# Patient Record
Sex: Female | Born: 1990 | Race: White | Hispanic: No | Marital: Married | Smoking: Former smoker
Health system: Southern US, Community
[De-identification: ages and names within clinical notes are randomized; demographics above are authoritative.]

## PROBLEM LIST (undated history)

## (undated) ENCOUNTER — Emergency Department (HOSPITAL_COMMUNITY): Disposition: A | Discharge status: Home / Self Care | Payer: Medicaid Other

## (undated) ENCOUNTER — Inpatient Hospital Stay (HOSPITAL_COMMUNITY): Payer: Self-pay

## (undated) DIAGNOSIS — F419 Anxiety disorder, unspecified: Secondary | ICD-10-CM

## (undated) DIAGNOSIS — Z9889 Other specified postprocedural states: Secondary | ICD-10-CM

## (undated) DIAGNOSIS — G43019 Migraine without aura, intractable, without status migrainosus: Secondary | ICD-10-CM

## (undated) DIAGNOSIS — Z975 Presence of (intrauterine) contraceptive device: Secondary | ICD-10-CM

## (undated) DIAGNOSIS — F329 Major depressive disorder, single episode, unspecified: Secondary | ICD-10-CM

## (undated) DIAGNOSIS — G40909 Epilepsy, unspecified, not intractable, without status epilepticus: Secondary | ICD-10-CM

## (undated) DIAGNOSIS — F32A Depression, unspecified: Secondary | ICD-10-CM

## (undated) DIAGNOSIS — K219 Gastro-esophageal reflux disease without esophagitis: Secondary | ICD-10-CM

## (undated) DIAGNOSIS — R102 Pelvic and perineal pain: Secondary | ICD-10-CM

## (undated) DIAGNOSIS — R112 Nausea with vomiting, unspecified: Secondary | ICD-10-CM

## (undated) DIAGNOSIS — Z309 Encounter for contraceptive management, unspecified: Secondary | ICD-10-CM

## (undated) DIAGNOSIS — M5430 Sciatica, unspecified side: Secondary | ICD-10-CM

## (undated) DIAGNOSIS — R51 Headache: Secondary | ICD-10-CM

## (undated) DIAGNOSIS — M549 Dorsalgia, unspecified: Secondary | ICD-10-CM

## (undated) DIAGNOSIS — R569 Unspecified convulsions: Secondary | ICD-10-CM

## (undated) DIAGNOSIS — F319 Bipolar disorder, unspecified: Secondary | ICD-10-CM

## (undated) DIAGNOSIS — G4700 Insomnia, unspecified: Secondary | ICD-10-CM

## (undated) HISTORY — DX: Epilepsy, unspecified, not intractable, without status epilepticus: G40.909

## (undated) HISTORY — DX: Bipolar disorder, unspecified: F31.9

## (undated) HISTORY — DX: Anxiety disorder, unspecified: F41.9

## (undated) HISTORY — DX: Migraine without aura, intractable, without status migrainosus: G43.019

## (undated) HISTORY — DX: Depression, unspecified: F32.A

## (undated) HISTORY — DX: Major depressive disorder, single episode, unspecified: F32.9

## (undated) HISTORY — DX: Dorsalgia, unspecified: M54.9

## (undated) HISTORY — DX: Sciatica, unspecified side: M54.30

## (undated) HISTORY — DX: Unspecified convulsions: R56.9

## (undated) HISTORY — DX: Presence of (intrauterine) contraceptive device: Z97.5

## (undated) HISTORY — DX: Pelvic and perineal pain: R10.2

## (undated) HISTORY — DX: Encounter for contraceptive management, unspecified: Z30.9

## (undated) HISTORY — DX: Insomnia, unspecified: G47.00

## (undated) HISTORY — PX: TUBAL LIGATION: SHX77

---
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
## 2008-09-26 ENCOUNTER — Other Ambulatory Visit: Admission: RE | Admit: 2008-09-26 | Discharge: 2008-09-26 | Payer: Self-pay | Admitting: Obstetrics and Gynecology

## 2009-01-29 ENCOUNTER — Emergency Department (HOSPITAL_COMMUNITY): Admission: EM | Admit: 2009-01-29 | Discharge: 2009-01-29 | Payer: Self-pay | Admitting: Emergency Medicine

## 2009-08-11 ENCOUNTER — Inpatient Hospital Stay (HOSPITAL_COMMUNITY): Admission: RE | Admit: 2009-08-11 | Discharge: 2009-08-14 | Payer: Self-pay | Admitting: Obstetrics & Gynecology

## 2009-08-11 ENCOUNTER — Ambulatory Visit: Payer: Self-pay | Admitting: Family Medicine

## 2009-10-17 ENCOUNTER — Emergency Department (HOSPITAL_COMMUNITY): Admission: EM | Admit: 2009-10-17 | Discharge: 2009-10-17 | Payer: Self-pay | Admitting: Emergency Medicine

## 2009-10-30 ENCOUNTER — Emergency Department (HOSPITAL_COMMUNITY): Admission: EM | Admit: 2009-10-30 | Discharge: 2009-10-30 | Payer: Self-pay | Admitting: Emergency Medicine

## 2009-10-30 ENCOUNTER — Ambulatory Visit: Payer: Self-pay | Admitting: Psychiatry

## 2009-11-20 ENCOUNTER — Ambulatory Visit (HOSPITAL_COMMUNITY): Payer: Self-pay | Admitting: Psychiatry

## 2009-11-23 ENCOUNTER — Ambulatory Visit (HOSPITAL_COMMUNITY): Payer: Self-pay | Admitting: Psychiatry

## 2009-11-27 ENCOUNTER — Ambulatory Visit (HOSPITAL_COMMUNITY): Payer: Self-pay | Admitting: Psychiatry

## 2009-12-05 ENCOUNTER — Ambulatory Visit (HOSPITAL_COMMUNITY): Payer: Self-pay | Admitting: Psychiatry

## 2009-12-06 ENCOUNTER — Ambulatory Visit (HOSPITAL_COMMUNITY): Payer: Self-pay | Admitting: Psychiatry

## 2009-12-13 ENCOUNTER — Other Ambulatory Visit: Admission: RE | Admit: 2009-12-13 | Discharge: 2009-12-13 | Payer: Self-pay | Admitting: Obstetrics and Gynecology

## 2010-01-11 ENCOUNTER — Ambulatory Visit (HOSPITAL_COMMUNITY): Payer: Self-pay | Admitting: Psychiatry

## 2010-01-23 ENCOUNTER — Ambulatory Visit (HOSPITAL_COMMUNITY): Payer: Self-pay | Admitting: Psychiatry

## 2010-02-07 ENCOUNTER — Inpatient Hospital Stay (HOSPITAL_COMMUNITY)
Admission: RE | Admit: 2010-02-07 | Discharge: 2010-02-13 | Payer: Self-pay | Source: Home / Self Care | Attending: Psychiatry | Admitting: Psychiatry

## 2010-02-07 ENCOUNTER — Emergency Department (HOSPITAL_COMMUNITY)
Admission: EM | Admit: 2010-02-07 | Discharge: 2010-02-07 | Disposition: A | Discharge status: Other Acute Inpatient Hospital | Payer: Self-pay | Source: Home / Self Care | Admitting: Emergency Medicine

## 2010-02-08 ENCOUNTER — Inpatient Hospital Stay (HOSPITAL_COMMUNITY): Admission: AD | Admit: 2010-02-08 | Discharge: 2009-11-04 | Payer: Self-pay | Admitting: Psychiatry

## 2010-02-19 ENCOUNTER — Ambulatory Visit (HOSPITAL_COMMUNITY): Payer: Self-pay | Admitting: Psychiatry

## 2010-02-22 ENCOUNTER — Ambulatory Visit (HOSPITAL_COMMUNITY): Payer: Self-pay | Admitting: Psychiatry

## 2010-02-28 ENCOUNTER — Ambulatory Visit (HOSPITAL_COMMUNITY): Payer: Self-pay | Admitting: Psychiatry

## 2010-03-06 ENCOUNTER — Ambulatory Visit (HOSPITAL_COMMUNITY): Admit: 2010-03-06 | Payer: Self-pay | Admitting: Psychiatry

## 2010-03-07 ENCOUNTER — Ambulatory Visit (HOSPITAL_COMMUNITY)
Admission: RE | Admit: 2010-03-07 | Discharge: 2010-03-07 | Payer: Self-pay | Source: Home / Self Care | Attending: Psychiatry | Admitting: Psychiatry

## 2010-03-14 ENCOUNTER — Ambulatory Visit (HOSPITAL_COMMUNITY)
Admission: RE | Admit: 2010-03-14 | Discharge: 2010-03-14 | Payer: Self-pay | Source: Home / Self Care | Attending: Psychiatry | Admitting: Psychiatry

## 2010-03-20 ENCOUNTER — Ambulatory Visit (HOSPITAL_COMMUNITY)
Admission: RE | Admit: 2010-03-20 | Discharge: 2010-03-20 | Payer: Self-pay | Source: Home / Self Care | Attending: Psychiatry | Admitting: Psychiatry

## 2010-03-23 ENCOUNTER — Ambulatory Visit (HOSPITAL_COMMUNITY)
Admission: RE | Admit: 2010-03-23 | Discharge: 2010-03-23 | Payer: Self-pay | Source: Home / Self Care | Attending: Psychiatry | Admitting: Psychiatry

## 2010-04-06 ENCOUNTER — Encounter (INDEPENDENT_AMBULATORY_CARE_PROVIDER_SITE_OTHER): Payer: Self-pay | Admitting: Psychiatry

## 2010-04-06 ENCOUNTER — Ambulatory Visit (HOSPITAL_COMMUNITY): Admit: 2010-04-06 | Payer: Self-pay | Admitting: Psychiatry

## 2010-04-06 DIAGNOSIS — F332 Major depressive disorder, recurrent severe without psychotic features: Secondary | ICD-10-CM

## 2010-04-17 ENCOUNTER — Encounter (HOSPITAL_COMMUNITY): Payer: Self-pay | Admitting: Psychiatry

## 2010-04-17 ENCOUNTER — Encounter (INDEPENDENT_AMBULATORY_CARE_PROVIDER_SITE_OTHER): Payer: Commercial Managed Care - PPO | Admitting: Psychiatry

## 2010-04-17 DIAGNOSIS — F3189 Other bipolar disorder: Secondary | ICD-10-CM

## 2010-04-20 ENCOUNTER — Encounter (INDEPENDENT_AMBULATORY_CARE_PROVIDER_SITE_OTHER): Payer: Commercial Managed Care - PPO | Admitting: Psychiatry

## 2010-04-20 DIAGNOSIS — F332 Major depressive disorder, recurrent severe without psychotic features: Secondary | ICD-10-CM

## 2010-04-30 ENCOUNTER — Encounter (INDEPENDENT_AMBULATORY_CARE_PROVIDER_SITE_OTHER): Payer: Commercial Managed Care - PPO | Admitting: Psychiatry

## 2010-04-30 DIAGNOSIS — F332 Major depressive disorder, recurrent severe without psychotic features: Secondary | ICD-10-CM

## 2010-05-14 LAB — DIFFERENTIAL
Basophils Absolute: 0 10*3/uL (ref 0.0–0.1)
Basophils Relative: 0 % (ref 0–1)
Eosinophils Absolute: 0 10*3/uL (ref 0.0–0.7)
Eosinophils Relative: 0 % (ref 0–5)
Lymphocytes Relative: 34 % (ref 12–46)
Lymphs Abs: 1.5 10*3/uL (ref 0.7–4.0)
Monocytes Absolute: 0.3 10*3/uL (ref 0.1–1.0)
Monocytes Relative: 6 % (ref 3–12)
Neutro Abs: 2.7 10*3/uL (ref 1.7–7.7)
Neutrophils Relative %: 59 % (ref 43–77)

## 2010-05-14 LAB — PREGNANCY, URINE: Preg Test, Ur: NEGATIVE

## 2010-05-14 LAB — BASIC METABOLIC PANEL
BUN: 11 mg/dL (ref 6–23)
CO2: 25 mEq/L (ref 19–32)
Calcium: 9.9 mg/dL (ref 8.4–10.5)
Chloride: 107 mEq/L (ref 96–112)
Creatinine, Ser: 0.71 mg/dL (ref 0.4–1.2)
GFR calc Af Amer: >60 mL/min (ref >60)
GFR calc non Af Amer: >60 mL/min (ref >60)
Glucose, Bld: 95 mg/dL (ref 70–99)
Potassium: 3.8 mEq/L (ref 3.5–5.1)
Sodium: 141 mEq/L (ref 135–145)

## 2010-05-14 LAB — URINALYSIS, ROUTINE W REFLEX MICROSCOPIC
Bilirubin Urine: NEGATIVE
Glucose, UA: NEGATIVE mg/dL
Hgb urine dipstick: NEGATIVE
Ketones, ur: NEGATIVE mg/dL
Nitrite: NEGATIVE
Protein, ur: NEGATIVE mg/dL
Specific Gravity, Urine: 1.015 (ref 1.005–1.030)
Urobilinogen, UA: 0.2 mg/dL (ref 0.0–1.0)
pH: 7 (ref 5.0–8.0)

## 2010-05-14 LAB — RAPID URINE DRUG SCREEN, HOSP PERFORMED
Amphetamines: NOT DETECTED
Barbiturates: NOT DETECTED
Benzodiazepines: POSITIVE — AB
Cocaine: NOT DETECTED
Opiates: NOT DETECTED
Tetrahydrocannabinol: NOT DETECTED

## 2010-05-14 LAB — CBC
HCT: 38.8 % (ref 36.0–46.0)
Hemoglobin: 13.4 g/dL (ref 12.0–15.0)
MCH: 29.6 pg (ref 26.0–34.0)
MCHC: 34.5 g/dL (ref 30.0–36.0)
MCV: 85.8 fL (ref 78.0–100.0)
Platelets: 246 10*3/uL (ref 150–400)
RBC: 4.52 MIL/uL (ref 3.87–5.11)
RDW: 12.7 % (ref 11.5–15.5)
WBC: 4.5 10*3/uL (ref 4.0–10.5)

## 2010-05-14 LAB — VALPROIC ACID LEVEL: Valproic Acid Lvl: <10 ug/mL — ABNORMAL LOW (ref 50.0–100.0)

## 2010-05-14 LAB — ETHANOL: Alcohol, Ethyl (B): <5 mg/dL (ref 0–10)

## 2010-05-14 LAB — URINE MICROSCOPIC-ADD ON

## 2010-05-14 LAB — SALICYLATE LEVEL: Salicylate Lvl: <4 mg/dL (ref 2.8–20.0)

## 2010-05-14 LAB — ACETAMINOPHEN LEVEL: Acetaminophen (Tylenol), Serum: <10 ug/mL — ABNORMAL LOW (ref 10–30)

## 2010-05-15 ENCOUNTER — Encounter (INDEPENDENT_AMBULATORY_CARE_PROVIDER_SITE_OTHER): Payer: Commercial Managed Care - PPO | Admitting: Psychiatry

## 2010-05-15 DIAGNOSIS — F3189 Other bipolar disorder: Secondary | ICD-10-CM

## 2010-05-15 LAB — URINE CULTURE
Colony Count: 50000
Culture  Setup Time: 201112090242
Special Requests: NEGATIVE

## 2010-05-15 LAB — TSH: TSH: 0.094 u[IU]/mL — ABNORMAL LOW (ref 0.350–4.500)

## 2010-05-17 LAB — LIPID PANEL
Cholesterol: 109 mg/dL (ref 0–200)
HDL: 39 mg/dL — ABNORMAL LOW (ref >39)
LDL Cholesterol: 53 mg/dL (ref 0–99)
Total CHOL/HDL Ratio: 2.8 RATIO
Triglycerides: 87 mg/dL (ref <150)
VLDL: 17 mg/dL (ref 0–40)

## 2010-05-18 ENCOUNTER — Encounter (INDEPENDENT_AMBULATORY_CARE_PROVIDER_SITE_OTHER): Payer: Commercial Managed Care - PPO | Admitting: Psychiatry

## 2010-05-18 DIAGNOSIS — F332 Major depressive disorder, recurrent severe without psychotic features: Secondary | ICD-10-CM

## 2010-05-18 LAB — COMPREHENSIVE METABOLIC PANEL
ALT: 13 U/L (ref 0–35)
AST: 14 U/L (ref 0–37)
Albumin: 4.3 g/dL (ref 3.5–5.2)
Alkaline Phosphatase: 59 U/L (ref 39–117)
BUN: 14 mg/dL (ref 6–23)
CO2: 26 mEq/L (ref 19–32)
Calcium: 9.3 mg/dL (ref 8.4–10.5)
Chloride: 108 mEq/L (ref 96–112)
Creatinine, Ser: 0.66 mg/dL (ref 0.4–1.2)
GFR calc Af Amer: >60 mL/min (ref >60)
GFR calc non Af Amer: >60 mL/min (ref >60)
Glucose, Bld: 85 mg/dL (ref 70–99)
Potassium: 4.1 mEq/L (ref 3.5–5.1)
Sodium: 139 mEq/L (ref 135–145)
Total Bilirubin: 0.4 mg/dL (ref 0.3–1.2)
Total Protein: 7 g/dL (ref 6.0–8.3)

## 2010-05-18 LAB — CBC
HCT: 35.1 % — ABNORMAL LOW (ref 36.0–46.0)
HCT: 36.1 % (ref 36.0–46.0)
Hemoglobin: 11.8 g/dL — ABNORMAL LOW (ref 12.0–15.0)
Hemoglobin: 12 g/dL (ref 12.0–15.0)
MCH: 29.4 pg (ref 26.0–34.0)
MCH: 29.9 pg (ref 26.0–34.0)
MCHC: 33.3 g/dL (ref 30.0–36.0)
MCHC: 33.6 g/dL (ref 30.0–36.0)
MCV: 88.5 fL (ref 78.0–100.0)
MCV: 88.9 fL (ref 78.0–100.0)
Platelets: 226 10*3/uL (ref 150–400)
Platelets: 249 10*3/uL (ref 150–400)
RBC: 3.95 MIL/uL (ref 3.87–5.11)
RBC: 4.08 MIL/uL (ref 3.87–5.11)
RDW: 13.7 % (ref 11.5–15.5)
RDW: 13.8 % (ref 11.5–15.5)
WBC: 4.4 10*3/uL (ref 4.0–10.5)
WBC: 4.6 10*3/uL (ref 4.0–10.5)

## 2010-05-18 LAB — BASIC METABOLIC PANEL
BUN: 14 mg/dL (ref 6–23)
CO2: 27 mEq/L (ref 19–32)
Calcium: 9.3 mg/dL (ref 8.4–10.5)
Chloride: 105 mEq/L (ref 96–112)
Creatinine, Ser: 0.56 mg/dL (ref 0.4–1.2)
GFR calc Af Amer: >60 mL/min (ref >60)
GFR calc non Af Amer: >60 mL/min (ref >60)
Glucose, Bld: 89 mg/dL (ref 70–99)
Potassium: 3.8 mEq/L (ref 3.5–5.1)
Sodium: 137 mEq/L (ref 135–145)

## 2010-05-18 LAB — HEPATIC FUNCTION PANEL
ALT: 13 U/L (ref 0–35)
AST: 19 U/L (ref 0–37)
Albumin: 4.5 g/dL (ref 3.5–5.2)
Alkaline Phosphatase: 70 U/L (ref 39–117)
Bilirubin, Direct: 0.1 mg/dL (ref 0.0–0.3)
Indirect Bilirubin: 0.4 mg/dL (ref 0.3–0.9)
Total Bilirubin: 0.5 mg/dL (ref 0.3–1.2)
Total Protein: 7.8 g/dL (ref 6.0–8.3)

## 2010-05-18 LAB — DIFFERENTIAL
Basophils Absolute: 0 10*3/uL (ref 0.0–0.1)
Basophils Absolute: 0 10*3/uL (ref 0.0–0.1)
Basophils Relative: 0 % (ref 0–1)
Basophils Relative: 0 % (ref 0–1)
Eosinophils Absolute: 0 10*3/uL (ref 0.0–0.7)
Eosinophils Absolute: 0.1 10*3/uL (ref 0.0–0.7)
Eosinophils Relative: 1 % (ref 0–5)
Eosinophils Relative: 1 % (ref 0–5)
Lymphocytes Relative: 38 % (ref 12–46)
Lymphocytes Relative: 39 % (ref 12–46)
Lymphs Abs: 1.7 10*3/uL (ref 0.7–4.0)
Lymphs Abs: 1.8 10*3/uL (ref 0.7–4.0)
Monocytes Absolute: 0.2 10*3/uL (ref 0.1–1.0)
Monocytes Absolute: 0.2 10*3/uL (ref 0.1–1.0)
Monocytes Relative: 5 % (ref 3–12)
Monocytes Relative: 5 % (ref 3–12)
Neutro Abs: 2.5 10*3/uL (ref 1.7–7.7)
Neutro Abs: 2.5 10*3/uL (ref 1.7–7.7)
Neutrophils Relative %: 55 % (ref 43–77)
Neutrophils Relative %: 55 % (ref 43–77)

## 2010-05-18 LAB — RAPID URINE DRUG SCREEN, HOSP PERFORMED
Amphetamines: NOT DETECTED
Barbiturates: NOT DETECTED
Benzodiazepines: POSITIVE — AB
Cocaine: NOT DETECTED
Opiates: NOT DETECTED
Tetrahydrocannabinol: NOT DETECTED

## 2010-05-18 LAB — POCT CARDIAC MARKERS
CKMB, poc: <1 ng/mL — ABNORMAL LOW (ref 1.0–8.0)
Myoglobin, poc: 23 ng/mL (ref 12–200)
Troponin i, poc: <0.05 ng/mL (ref 0.00–0.09)

## 2010-05-18 LAB — D-DIMER, QUANTITATIVE: D-Dimer, Quant: 1.26 ug/mL-FEU — ABNORMAL HIGH (ref 0.00–0.48)

## 2010-05-18 LAB — POCT PREGNANCY, URINE: Preg Test, Ur: NEGATIVE

## 2010-05-18 LAB — TSH: TSH: 0.906 u[IU]/mL (ref 0.350–4.500)

## 2010-05-18 LAB — ETHANOL: Alcohol, Ethyl (B): <5 mg/dL (ref 0–10)

## 2010-05-21 LAB — CBC
HCT: 28.4 % — ABNORMAL LOW (ref 36.0–46.0)
HCT: 29 % — ABNORMAL LOW (ref 36.0–46.0)
HCT: 36.3 % (ref 36.0–46.0)
Hemoglobin: 10.1 g/dL — ABNORMAL LOW (ref 12.0–15.0)
Hemoglobin: 12.6 g/dL (ref 12.0–15.0)
Hemoglobin: 9.9 g/dL — ABNORMAL LOW (ref 12.0–15.0)
MCHC: 34.5 g/dL (ref 30.0–36.0)
MCHC: 35 g/dL (ref 30.0–36.0)
MCHC: 35 g/dL (ref 30.0–36.0)
MCV: 93.9 fL (ref 78.0–100.0)
MCV: 94.3 fL (ref 78.0–100.0)
MCV: 94.5 fL (ref 78.0–100.0)
Platelets: 159 10*3/uL (ref 150–400)
Platelets: 193 10*3/uL (ref 150–400)
Platelets: 200 10*3/uL (ref 150–400)
RBC: 3.01 MIL/uL — ABNORMAL LOW (ref 3.87–5.11)
RBC: 3.08 MIL/uL — ABNORMAL LOW (ref 3.87–5.11)
RBC: 3.87 MIL/uL (ref 3.87–5.11)
RDW: 12.9 % (ref 11.5–15.5)
RDW: 13 % (ref 11.5–15.5)
RDW: 13.7 % (ref 11.5–15.5)
WBC: 6.4 10*3/uL (ref 4.0–10.5)
WBC: 6.6 10*3/uL (ref 4.0–10.5)
WBC: 7.9 10*3/uL (ref 4.0–10.5)

## 2010-05-21 LAB — RPR: RPR Ser Ql: NONREACTIVE

## 2010-05-21 LAB — MRSA PCR SCREENING: MRSA by PCR: NEGATIVE

## 2010-05-29 ENCOUNTER — Encounter (INDEPENDENT_AMBULATORY_CARE_PROVIDER_SITE_OTHER): Payer: Commercial Managed Care - PPO | Admitting: Psychiatry

## 2010-05-29 DIAGNOSIS — F3189 Other bipolar disorder: Secondary | ICD-10-CM

## 2010-05-31 ENCOUNTER — Encounter (INDEPENDENT_AMBULATORY_CARE_PROVIDER_SITE_OTHER): Payer: Commercial Managed Care - PPO | Admitting: Psychiatry

## 2010-05-31 DIAGNOSIS — F332 Major depressive disorder, recurrent severe without psychotic features: Secondary | ICD-10-CM

## 2010-06-06 LAB — PREGNANCY, URINE: Preg Test, Ur: POSITIVE

## 2010-06-07 ENCOUNTER — Encounter (INDEPENDENT_AMBULATORY_CARE_PROVIDER_SITE_OTHER): Payer: Commercial Managed Care - PPO | Admitting: Psychiatry

## 2010-06-07 DIAGNOSIS — F332 Major depressive disorder, recurrent severe without psychotic features: Secondary | ICD-10-CM

## 2010-06-08 ENCOUNTER — Emergency Department (HOSPITAL_COMMUNITY)
Admission: EM | Admit: 2010-06-08 | Discharge: 2010-06-08 | Disposition: A | Discharge status: Home / Self Care | Payer: 59 | Attending: Emergency Medicine | Admitting: Emergency Medicine

## 2010-06-08 DIAGNOSIS — W57XXXA Bitten or stung by nonvenomous insect and other nonvenomous arthropods, initial encounter: Secondary | ICD-10-CM | POA: Insufficient documentation

## 2010-06-08 DIAGNOSIS — IMO0001 Reserved for inherently not codable concepts without codable children: Secondary | ICD-10-CM | POA: Insufficient documentation

## 2010-06-08 DIAGNOSIS — R109 Unspecified abdominal pain: Secondary | ICD-10-CM | POA: Insufficient documentation

## 2010-06-08 DIAGNOSIS — F341 Dysthymic disorder: Secondary | ICD-10-CM | POA: Insufficient documentation

## 2010-06-08 DIAGNOSIS — R29898 Other symptoms and signs involving the musculoskeletal system: Secondary | ICD-10-CM | POA: Insufficient documentation

## 2010-06-08 DIAGNOSIS — Z79899 Other long term (current) drug therapy: Secondary | ICD-10-CM | POA: Insufficient documentation

## 2010-06-08 DIAGNOSIS — S30860A Insect bite (nonvenomous) of lower back and pelvis, initial encounter: Secondary | ICD-10-CM | POA: Insufficient documentation

## 2010-06-08 DIAGNOSIS — R197 Diarrhea, unspecified: Secondary | ICD-10-CM | POA: Insufficient documentation

## 2010-06-08 DIAGNOSIS — R112 Nausea with vomiting, unspecified: Secondary | ICD-10-CM | POA: Insufficient documentation

## 2010-06-08 DIAGNOSIS — S90569A Insect bite (nonvenomous), unspecified ankle, initial encounter: Secondary | ICD-10-CM | POA: Insufficient documentation

## 2010-06-08 DIAGNOSIS — K5289 Other specified noninfective gastroenteritis and colitis: Secondary | ICD-10-CM | POA: Insufficient documentation

## 2010-06-18 ENCOUNTER — Encounter (HOSPITAL_COMMUNITY): Payer: Commercial Managed Care - PPO | Admitting: Psychiatry

## 2010-07-03 ENCOUNTER — Encounter (INDEPENDENT_AMBULATORY_CARE_PROVIDER_SITE_OTHER): Payer: Commercial Managed Care - PPO | Admitting: Psychiatry

## 2010-07-03 DIAGNOSIS — F3189 Other bipolar disorder: Secondary | ICD-10-CM

## 2010-07-04 ENCOUNTER — Encounter (INDEPENDENT_AMBULATORY_CARE_PROVIDER_SITE_OTHER): Payer: Commercial Managed Care - PPO | Admitting: Psychiatry

## 2010-07-04 DIAGNOSIS — F332 Major depressive disorder, recurrent severe without psychotic features: Secondary | ICD-10-CM

## 2010-07-11 ENCOUNTER — Encounter (HOSPITAL_COMMUNITY): Payer: Commercial Managed Care - PPO | Admitting: Psychiatry

## 2010-07-17 ENCOUNTER — Encounter (INDEPENDENT_AMBULATORY_CARE_PROVIDER_SITE_OTHER): Payer: Commercial Managed Care - PPO | Admitting: Psychiatry

## 2010-07-17 DIAGNOSIS — F332 Major depressive disorder, recurrent severe without psychotic features: Secondary | ICD-10-CM

## 2010-07-17 NOTE — Discharge Summary (Signed)
NAMEBRITTIE, Tonya Harmon              ACCOUNT NO.:  [id number]   MEDICAL RECORD NO.:  [id number]          PATIENT TYPE:  INP   LOCATION:  9113                          FACILITY:  [facility]   PHYSICIAN:  Lesly Dukes, M.D. DATE OF BIRTH:  October 12, 1990   DATE OF ADMISSION:  02/15/2007  DATE OF DISCHARGE:  02/19/2007                               DISCHARGE SUMMARY   DISCHARGE DIAGNOSIS:  An 20 year old, gravida 1, para 1-0-0-1,  postoperative day number three status post primary low transverse  cesarean section for cephalopelvic disproportion.   At the admission on December 14th, the patient is an 20 year old,  primigravida at 54 weeks, who presented for induction of labor secondary  to post dates.  Her induction was started with Pitocin and a Foley bulb.  The patient progressed to approximately 6-cm dilation and, at  approximately 3 a.m. on December 15th, the decision was made to proceed  with a primary low transverse cesarean section secondary to a  dysfunctional uterine pattern and impending CPD.  Dr. Emelda Fear performed  a primary low transverse C-section for cephalopelvic disproportion at  approximately 4 a.m. on the 15th, and a healthy infant was born with an  estimated blood loss of 600 ml.  The details of that procedure can be  found in the dictated operative note.  Postpartum, the patient had an  uneventful hospital stay, her pain was well-controlled on oral pain  medications, bottle feeding, and plans  Implanon at her six-week visit.  Her vital signs remained stable, and her labs remained stable as well.  The patient was discharged on February 19, 2007, on postoperative day #3  with no complaints and the desire for DC home at which time she was  afebrile, her vital signs were stable, and her incision was within  normal limits.   She was DC 'd home with her infant in a stable status.  The staples were  removed prior to her discharge.  She was bottle feeding and counseled  extensively  on intercourse precautions.  She was given an appointment to  follow up in three days at [facility] for an incision check, and also  six weeks for a postpartum check and Implanon placement for her birth  control.      Maylon Cos, C.N.M.      Lesly Dukes, M.D.  Electronically Signed    SS/MEDQ  D:  04/02/2007  T:  04/02/2007  Job:  [id number]

## 2010-07-17 NOTE — Assessment & Plan Note (Signed)
NAME:  Tonya Harmon, Tonya Harmon               CHART#:  [id number]   DATE:                                   DOB:  October 31, 1990   REFERRING PHYSICIAN:  Mila Homer. Sudie Bailey, M.D.   PROBLEM LIST:  Abdominal pain.   SUBJECTIVE:  Ms. Tonya Harmon is an 20 year old female who was last seen in  clinic in April 2008.  She had been extensively evaluated for right  lower quadrant pain and no etiology was identified, but she eventually  became pregnant.  During her pregnancy after she became pregnant she had  no problems with right lower quadrant pain.  She had no problems until  after she delivered her child in December of 2008.  She started to have  pain in her right lower quadrant again in February of 2009.  Her child  is currently 20 pounds and she often has to lift her and carry her in a  one-handed carrier.  She is also complaining of wrist discomfort because  of having to carry the child.  Since this started in February it has  been about the same.  It primarily hurts when she gets up and starts  moving around and especially at nighttime.  It does not radiate.  It is  not associated with fever, nausea, vomiting, constipation, or diarrhea.  Sometimes she gets a headache.  She is dealing with the stress of  motherhood pretty good.  She lives with her parents and the father of  the child lives with his parents.  Occasionally she sees blood on the  stool when she wipes and in the stool.  She denies any problems with  urination, dysuria, or hematuria.  She has had no vaginal bleeding.  Her  last menstrual period was March 2 and she is not breast feeding.  She is  currently not sexually active.   MEDICATIONS:  None.   OBJECTIVE:  VITAL SIGNS:  Weight 126 pounds (up seven pounds since April  2008), height 5 feet 6 inches, BMI 20.3 (LB).  Temperature 98.4, blood  pressure 110/74, pulse 72.  GENERAL:  In general she is in no apparent distress, alert and oriented  x4.  HEENT:  Atraumatic, normocephalic.  Pupils  equal, round, reactive  to light.  Mouth:  No oral lesions.  NECK:  Has full range of motion.  No lymphadenopathy.LUNGS:  Clear to auscultation  bilaterally.CARDIOVASCULAR:  Regular rhythm, no murmur.  Normal  S1/S2.ABDOMEN:  Bowel sounds are present, soft, tenderness to palpation  in the right lower quadrant without rebound or guarding.  The pain is  precipitated and increased in intensity with Carnett's maneuver.  The  pain was increased in intensity with straight leg raises and with her  raising her head, shoulders, and arms off the exam table.  NEUROLOGIC:  She has no focal neurologic deficits.  EXTREMITIES:  No cyanosis or  edema.  SKIN:  No pretibial lesions.   ASSESSMENT:  Ms. Tonya Harmon is an 20 year old female with right lower quadrant  pain which is likely musculoskeletal in etiology.  She does, however,  have a history of small bowel enteritis.  She has a low likelihood of  avid inflammatory bowel disease as an etiology for her abdominal pain  and rectal bleeding.  Thank you for allowing me to see Ms. Tonya Harmon  in  consultation.  More recommendations to follow.   RECOMMENDATIONS:  1. She is asked to use a stroller to transport her child instead of a      one-handed carrier.  2. She is given her discharge instructions in writing.  She is to      avoid lifting greater than 20 pounds without assistance.  3. She is to use ibuprofen 800 mg every eight hours for 10 days and to      take it with food or milk to avoid abdominal pain.  4. Will add Elavil 10 mg at bedtime and increase to 20 mg in three      days.  She is cautioned that is may cause drowsiness, dry mouth,      dry eyes, and urinary retention.  5. Will order a CT scan of the abdomen and pelvis for abdominal pain      as well as a CBC and a C reactive protein for the rectal bleeding      and abdominal pain.  6. She has a follow-up appointment to see me in one month.   ADDENDUM:  3/18 CT Scan: no acute abnormality        Kassie Mends, M.D.  Electronically Signed     SM/MEDQ  D:  05/19/2007  T:  05/20/2007  Job:  [id number]   cc:   Mila Homer. Sudie Bailey, M.D.  Cyril Mourning, M.D.

## 2010-07-17 NOTE — Op Note (Signed)
NAME:  Tonya Harmon, Tonya Harmon              ACCOUNT NO.:  [id number]   MEDICAL RECORD NO.:  [id number]          PATIENT TYPE:  INP   LOCATION:  9113                          FACILITY:  [facility]   PHYSICIAN:  Tilda Burrow, M.D. DATE OF BIRTH:  05/03/1990   DATE OF PROCEDURE:  02/16/2007  DATE OF DISCHARGE:                               OPERATIVE REPORT   PREOPERATIVE DIAGNOSIS:  Pregnancy of 41 weeks and one day,  cephalopelvic disproportion.   POSTOPERATIVE DIAGNOSIS:  Pregnancy of 41 weeks and one day,  cephalopelvic disproportion, delivered, light meconium stained amniotic  fluid.   OPERATION PERFORMED:  Primary low transverse cervical cesarean section.   SURGEON:  Tilda Burrow, M.D.   ASSISTANT:  None.   ANESTHESIA:  Epidural.   COMPLICATIONS:  None.   FINDINGS:  Large framed female infant, Apgars 9 and 9 without evidence of  complications.  Thin meconium discoloration of fluid apparently recently  developed as the membranes and vocal cords were both visibly clear of  staining.   INDICATIONS FOR PROCEDURE:  21 year old primiparous female who presented  in early labor early on February 14, 2006, labored spontaneously until  mid afternoon required Pitocin augmentation of labor and progressed  slowly until arrest of labor at 7 cm dilation at 3 a.m.   DESCRIPTION OF PROCEDURE:  The patient was taken to the operating room,  epidural topped up additionally to gain adequate analgesia on the  patient's left side, using 10 mL of additional 2% buffered lidocaine,  then Pfannenstiel incision performed in standard fashion with splitting  of the rectus muscles in the midline and easy blunt entry of the  peritoneal cavity.  Bladder flap was developed.  Transverse uterine  incision was made through a thinned out lower uterine segment and fetal  vertex rotated into the incision and delivered by fundal pressure.  Baby  cried vigorously from the beginning.  Bulb suctioning of the nose and  pharynx were performed and the infant passed to Dr. Eric Form, waiting  pediatrician.  Apgars were assigned by him with Apgars of 8 and 9.  Cord  blood samples were obtained and placenta delivered by crude' uterine  massage.  The uterus was irrigated briefly.  Some final membranes  extracted and uterus appeared clean internally.  The uterus was then  closed using two-layer, running locking first layer and continuous  running second layer with bladder flap loosely reapproximated.  Abdomen  is inspected and no clots and significant intra-abdominal blood  encountered.  The anterior peritoneum was closed with 2-0 chromic.  The  rectus muscles loosely reapproximated with two interrupted 2-0 chromic  sutures and 0 Vicryl used to close the fascia.  Subcu fatty tissue was reapproximated with four interrupted sutures and  staple closure of the skin completed tissue reapproximation. The  estimated blood loss for this procedure was 600 mL.  Procedure tolerated  beautifully by the patient with good uterine tone___  postprocedure.      Tilda Burrow, M.D.  Electronically Signed     JVF/MEDQ  D:  02/16/2007  T:  02/16/2007  Job:  [id number]  cc:   Mila Homer. Sudie Bailey, M.D.  Fax: [phone]

## 2010-07-20 NOTE — Discharge Summary (Signed)
[name], [name]              ACCOUNT NO.:  [id number]   MEDICAL RECORD NO.:  [id number]          PATIENT TYPE:  INP   LOCATION:  A427                          FACILITY:  [facility]   PHYSICIAN:  Kassie Mends, M.D.      DATE OF BIRTH:  May 29, 1990   DATE OF ADMISSION:  02/13/2006  DATE OF DISCHARGE:  12/17/2007LH                               DISCHARGE SUMMARY   DISCHARGE DIAGNOSES:  1. Right lower lobe pneumonia.  2. Small bowel ileitis.   HOSPITAL COURSE:  Tonya Harmon is a 20 year old female who presented to the  hospital on December 13 with right lower quadrant pain.  She was  initially admitted to Dr. Emelda Fear for observation because of the  significant abdominal discomfort.  Dr. Franky Macho was consulted for  consideration of appendicitis.  He felt she had a low likelihood of  appendicitis due to a negative CT scan.  It was felt that she did not  have a surgical abdomen and GI was consulted because of her significant  discomfort.  I examined her and she had significant tenderness in the  right lower quadrant.  A repeat CT scan was performed and she had  several centimeters segment of ileum demonstrating wall thickening and  edema which was new from the CT scan that was performed on admission.  Those findings were consistent with infectious enteritis and she was  placed on Cipro.  Her antibiotics were changed from Cipro to Levaquin to  cover infectious organisms in both the lungs and the small intestines.  Twenty-four hours after initiation of antibiotics, her symptoms were  improved.  On hospital day 2,  she coughed up brown sputum.  A chest x-  ray was ordered and findings were consistent with superior segment  pneumonia in the right lower lobe. She received albuterol MDI as well as  incentive spirometry.  Sputum culture was obtained and is pending on the  day of discharge.  On the day of discharge, her nausea, vomiting, and  abdominal pain are resolved.  She was requiring very  few oxycodone to  control her abdominal pain.  She denies any diarrhea and is passing gas.  She is tolerating p.os.   DISCHARGE MEDICATIONS:  1. Albuterol MDI two puffs every six hours.  2. Levaquin 500 mg p.o. daily to complete a 10-day course.  3. Oxycodone 5 mg tablets one to two p.o. every six hours as needed      for pain #25.  4. Phenergan 25 mg tablets half to one p.o. every six hours as needed      for nausea and vomiting.   DISCHARGE LABORATORY DATA:  Sodium 139, potassium 3.6, chloride 109, CO2  27, glucose 98, BUN 3, creatinine 0.5, total bilirubin 0.3, alk phos 82.  AST 12. ALT 8, total protein 5.4, albumin 2.9, calcium 8.4.  White count  6.1, hemoglobin 10.5, MCV 87.4, platelets 198.      Kassie Mends, M.D.  Electronically Signed    SM/MEDQ  D:  02/17/2006  T:  02/18/2006  Job:  [id number]

## 2010-07-20 NOTE — Consult Note (Signed)
NAME:  Tonya Harmon, Tonya Harmon              ACCOUNT NO.:  [id number]   MEDICAL RECORD NO.:  [id number]          PATIENT TYPE:  INP   LOCATION:  A427                          FACILITY:  [facility]   PHYSICIAN:  Kassie Mends, M.D.      DATE OF BIRTH:  [birth year]   DATE OF CONSULTATION:  02/14/2006  DATE OF DISCHARGE:                                 CONSULTATION   REASON FOR CONSULTATION:  Right lower quadrant abdominal pain.   REQUESTING PHYSICIAN:  Dalia Heading, M.D.   HISTORY OF PRESENT ILLNESS:  Patient is a 20 year old Caucasian female  with a three day history of abdominal pain with intermittent vomiting.  The pain initially started kind of diffusely through her mid abdomen and  has since localized to the right lower quadrant extending into the right  mid abdomen.  She presented to the emergency department early yesterday  morning.  She has had a couple of episodes of vomiting en route to the  hospital.  She had a CT of the abdomen and pelvis which revealed  prominent periuterine vein, suggestive of pelvic congestion syndrome,  but her appendix appeared to be normal.  She since had a pelvic and  vaginal ultrasound which revealed a moderate amount of free fluid in the  peritoneal cavity.  Abdominal ultrasound today was negative.  White  count has been normal at 7000, sed rate 0.  Amylase 99.  Urinalysis was  negative except for trace ketones.  Throughout today, her pain has been  progressive.  She began developing a fever around 2:30 with a temp of  100.8.  At the time of my examination at 4:30, she had a temp of 102.  She denies any diarrhea.  She had a normal bowel movement yesterday.  No  melena or rectal bleeding, hematemesis, heart burn.   HOME MEDICATIONS:  She rarely takes Advil for headache.   ALLERGIES:  SUPRAX caused a rash.   PAST MEDICAL HISTORY:  Negative for chronic illnesses.   PAST SURGICAL HISTORY:  None.   FAMILY HISTORY:  She has two first cousins, one with UC,  ulcerative  colitis, and one with Crohn's disease.   SOCIAL HISTORY:  She is home-schooled.  Denies any tobacco, drug, or  alcohol use.  She raises horses.   REVIEW OF SYSTEMS:  See HPI for GI.  GU:  Denies sexual activity.  Last  menstrual period ended nine days ago.  Cycles are very regular.   PHYSICAL EXAMINATION:  VITAL SIGNS:  Temp is 102, pulse 90, respirations  18, blood pressure 97/46.  GENERAL:  A thin, well-developed and well-nourished young Caucasian  female in no acute distress when entering the room; however, throughout  the evaluation, she has episodes of severe pain and rocks in the bed and  cries.  She is accompanied by her mother, Darl Pikes.  SKIN:  Warm and dry with no jaundice.  HEENT:  Sclerae are anicteric.  Oropharyngeal mucosa moist and pink.  No  lesions, erythema, or exudate.  NECK:  No lymphadenopathy.  CHEST:  Lungs are clear to auscultation.  CARDIOVASCULAR:  Regular rate  and rhythm, normal S1 and S2.  No murmurs,  rubs or gallops.  ABDOMEN:  Hyperactive bowel sounds.  Nondistended.  Abdomen is soft.  Positive guarding.  Positive rebound tenderness.  She has severe in the  right lower quadrant and right mid abdomen.  No organomegaly or masses.  No abdominal hernias.  EXTREMITIES:  No edema.   LABS:  White count 7000, hemoglobin 11.1, hematocrit 32.6, platelets  231,000, sed rate 0.  Potassium 3.6, BUN 5, creatinine 0.5, glucose 103,  amylase 99.   IMPRESSION:  Tonya Harmon is a 20 year old lady with a three day history of  abdominal pain which is localized to the right lower quadrant.  She has  rebound and guarding at this point.  She has a temp of 102.  Abdomen  remains soft.  This is concerning for evolving appendicitis, SB ileitis,  or epiploic appendigitis.  She has no other symptoms suggestive of  irritable bowel syndrome or Crohn's disease.  I have discussed patient  with Dr. Cira Servant.  Would recommend serial abdominal exams and have a low  threshold to  repeat CT Scan.  Consider exploratory laparoscopy.  If this  is negative, then would offer, Given small bowel capsule to complete  evaluation of the small bowel.  Also, would add STAT LFTs, lipase, and  repeat WBC.  I have made the patient n.p.o. until examined by Dr. Cira Servant.   I would like to thank Dr. Franky Macho and Dr. Christin Bach for  allowing [place] to take part in the care of this patient.      Tana Coast, P.A.      Kassie Mends, M.D.  Electronically Signed    LL/MEDQ  D:  02/14/2006  T:  02/14/2006  Job:  [id number]   cc:   Dalia Heading, M.D.  Fax: [phone]   Tilda Burrow, M.D.  Fax: [phone]

## 2010-07-20 NOTE — Consult Note (Signed)
NAME:  Tonya Harmon, Tonya Harmon              ACCOUNT NO.:  [id number]   MEDICAL RECORD NO.:  [id number]          PATIENT TYPE:  AMB   LOCATION:  DAY                           FACILITY:  [facility]   PHYSICIAN:  Kassie Mends, M.D.      DATE OF BIRTH:  Dec 01, 1990   DATE OF CONSULTATION:  03/21/2006  DATE OF DISCHARGE:                                 CONSULTATION   PROBLEM LIST:  1. Abdominal pain  2. Family history of ulcerative colitis and Crohn's disease.   SUBJECTIVE:  Ms. Tonya Harmon is a 20 year old female who was seen in the  hospital in December2007 for abdominal pain.  Initial CT scan revealed  no etiology for her severe right abdominal pain.  Repeat CT scan 24  hours post hospitalization shows small bowel ileitis.  She was started  on Levaquin and had symptomatic improvement.  She reports having  continual abdominal pain since discharge.  She has seen blood in her  stool three times since discharge.  She went to the bathroom and thought  she was having a bowel movement and states she saw primarily blood.  She  had pain in her right side which is worse during the night.  She denies  any diarrhea or fever.  She describes it as sharp pain, no radiation.  She has nausea and vomiting one to two times a week.  The nausea and  vomiting happens almost every day.  Her pain began 3-4 days after she  left the hospital.  She describes it as really bad on Christmas day  and she could no open per presents. She denies any constipation.  She  has not seen Dr. Sudie Bailey for this pain.  She denies any blood in her  stool or dysuria.  She has been unable to participate in her early  morning church activities because of the pain.  Her last vomiting  episode was approximately 2 days ago.  She denies any weight loss.  If  she is hungry, the pain is worse.  Nothing else makes the pain worse.  Food makes the pain better as well as warm compresses.  If she keeps the  knees bent, the pain is better.  She denies any travel or  other  antibiotics since discharge.  Last menstrual period was approximately a  week ago.  The pain also gets worse around her menstrual cycle.  After  her menstrual cycle is resolved, the pain becomes more tolerable.  She  denies any difficulty swallowing.  When she gets any bleeding, She has  very little formed stool at the time.   FAMILY HISTORY:  She denies any family history of colon cancer or colon  polyps.   ALLERGIES:  Suprax, morphine, hydromorphone, Pepcid.   MEDICATIONS:  None.   OBJECTIVE:  VITAL SIGNS:  Weight 121 pounds, height 5 feet 7 inches, BMI  19 (underweight).  Temperature 98.9, blood pressure 100/68, pulse 92.  GENERAL:  She is in no apparent distress, alert and oriented x4.  HEENT:  Exam is atraumatic, normocephalic.  Pupils equal and react to light.  Mouth - no oral  lesions.  Posterior pharynx without erythema or exudate.  NECK:  Full range of motion.  No lymphadenopathy.  LUNGS:  Clear to  auscultation bilaterally.  CARDIOVASCULAR:  Regular rhythm, no murmur,  normal S1-S2.  ABDOMEN:  Bowel sounds present, soft, nondistended,  tenderness to palpation in bilateral lower quadrants, right greater than  left), no guarding, mild rebound.  EXTREMITIES:  No cyanosis, clubbing  or edema.  SKIN:  No erythema on the shins bilaterally. NEURO: no focal deficits   ASSESSMENT:  Ms. Tonya Harmon is a 20 year old female with abdominal pain and  rectal bleeding without diarrhea.  She does admit to using ibuprofen on  a monthly basis for menstrual cramps.  The differential diagnosis for  her symptomatology includes NSAID ulcers, atypical presentation for  inflammatory bowel disease, and a low likelihood of colon polyps, colon  cancer, or Meckel's diverticulum.  Thank you for allowing me to see Ms.  Tonya Harmon in consultation.  My recommendations follow.   RECOMMENDATIONS:  1. Will schedule a colonoscopy with visualization of the terminal      ileum as soon as possible.  If no source for  rectal bleeding is      found, then I will also perform an EGD at that time.  2. If no source for her hematochezia is seen, then a capsule endoscopy      or CT enterography will follow.  3. She is asked to avoid anti-inflammatory drugs and use Tylenol as      needed for menstrual cramps.  4. She has a followup appointment to see me after the endoscopy is      complete.      Kassie Mends, M.D.  Electronically Signed     SM/MEDQ  D:  03/24/2006  T:  03/24/2006  Job:  [id number]   cc:   Mila Homer. Sudie Bailey, M.D.  Fax: [phone]

## 2010-07-20 NOTE — H&P (Signed)
NAME:  BRAELEY, BUSKEY              ACCOUNT NO.:  [id number]   MEDICAL RECORD NO.:  [id number]          PATIENT TYPE:  OBV   LOCATION:  A427                          FACILITY:  [facility]   PHYSICIAN:  Tilda Burrow, M.D. DATE OF BIRTH:  16-Sep-1990   DATE OF ADMISSION:  02/13/2006  DATE OF DISCHARGE:  LH                              HISTORY & PHYSICAL   CHIEF COMPLAINT:  Right lower quadrant abdominal pain, _Mettelschmertz__  versus early appendicitis versus ?   HISTORY OF PRESENT ILLNESS:  This 20 year old female gravida 0, para 0  last menstrual period ending nine days ago, not sexually active with  prior history of unexplained right lower quadrant pain at age 55  requiring overnight observation in the hospital.  She is seen at this  time from the emergency room after a 24-hour history of abdominal  bloating and vague abdominal discomfort over the entire abdomen, which  has progressed and localized to the right lower quadrant where she is  severely tender with slightly greater tenderness upon testing for  rebound tenderness.  She is guarding significantly over both lower  quadrants.  She is an extremely reliable historian.  Her mother works in  the emergency room and history is completely consistent with  appendicitis but CT scan is negative.  White count is 6000.   She is placed on observation status overnight to allow for symptoms to  evolve and be monitored.   PAST MEDICAL HISTORY:  Benign.   Adjusted, well-oriented, cooperative Caucasian female.   ALLERGIES:  Rash from third generation of CEPHALOSPORIN as a child.  No  other allergies.   No current medications.   PHYSICAL EXAMINATION:  Afebrile.  Alert and oriented x3.  Throwing up  obviously during exam.  Abdomen guarding in all four quadrants,  particularly the right greater than left lower quadrant.  External  genitalia normal.  Pelvic exam deferred at this time.   CT scan shows no significant cul-de-sac fluid.  It  shows small GYN  structures and no discernible appendicitis.   PLAN:  Admit overnight observation and analgesics as needed for pain  relief.  Serial temperature check and white count recheck in the a.m.  Dr. Lovell Sheehan to assist with monitoring the patient.     Tilda Burrow, M.D.  Electronically Signed    JVF/MEDQ  D:  02/13/2006  T:  02/13/2006  Job:  [id number]

## 2010-07-20 NOTE — Op Note (Signed)
NAMESWANNIE, Tonya Harmon              ACCOUNT NO.:  [id number]   MEDICAL RECORD NO.:  [id number]          PATIENT TYPE:  AMB   LOCATION:  DAY                           FACILITY:  [facility]   PHYSICIAN:  Kassie Mends, M.D.      DATE OF BIRTH:  06-04-1990   DATE OF PROCEDURE:  03/26/2006  DATE OF DISCHARGE:                               OPERATIVE REPORT   REFERRING PHYSICIAN:  Mila Homer. Sudie Bailey, M.D.   PROCEDURE:  1. Ileocolonoscopy with cold forceps biopsy.  2. Esophagogastroduodenoscopy.   INDICATIONS FOR EXAM:  Tonya Harmon is a 20 year old female who was admitted  in December, 2007 for severe right lower quadrant abdominal pain.  CT  scan showed small bowel ileitis.  She had symptomatic improvement on  Levaquin.  Three to four days after discharge, her abdominal pain  returned and has persisted.  She repeatedly has declined narcotic pain  medicine.  She has had rectal bleeding three times.  She does use anti-  inflammatory drugs.   FINDINGS:  1. Nodular-appearing terminal ileum.  Biopsies obtained via cold      forceps.  2. Normal colon without evidence of polyps, masses, inflammatory      changes, or AVMs.  Normal retroflexed view of the rectum.  She had      a moderate amount of retained formed stool.  3. Normal esophagus without evidence of Barrett's, normal stomach,      duodenal bulb, and second portion of the duodenum.   RECOMMENDATIONS:  1. Capsule endoscopy in 10 days.  She is asked to drink 1 liter of      HalfLytely on the evening prior to the capsule endoscopy.  She may      start clear liquids after 6 p.m. on the night before the capsule      endoscopy.  2. I will call her with the results of her biopsy report.  3. She is scheduled for return-patient visit in three weeks.  4. She is asked to avoid aspirin and anti-inflammatory drugs.  She may      resume her previous diet.  She again declined any narcotic pain      medicine.   MEDICATIONS:  1. Demerol 125 mg IV.  2.  Versed 11 mg IV.  3. Phenergan 12.5 mg IV.   PROCEDURAL TECHNIQUE:  Physical exam was performed, and informed consent  was obtained per the patient after explaining the risks, benefits and  alternatives to the procedure.  Patient was connected to the monitor and  placed in the left lateral decubitus position.  Continuous oxygen was  provided by nasal cannula, and IV medicine administered through an  indwelling cannula.  After administration of sedation and rectal exam,  the patient's rectum was intubated, and the scope was advanced under  direct visualization to the cecum.  The scope was subsequently removed  slowly, by carefully examined the color, texture, anatomy, and integrity  of the mucosa on the way out.   After the colonoscopy, the patient's esophagus was intubated with a  diagnostic gastroscope, and the scope was advanced under direct  visualization  to the second portion of the duodenum.  The scope was  subsequently removed slowly by carefully examining the color, texture,  anatomy, and integrity of the mucosa on the way out.  Patient was  recovered in the endoscopy suite and discharged to home in satisfactory  condition.      Kassie Mends, M.D.  Electronically Signed     SM/MEDQ  D:  03/27/2006  T:  03/27/2006  Job:  [id number]   cc:   Mila Homer. Sudie Bailey, M.D.  Fax: [phone]

## 2010-07-23 ENCOUNTER — Encounter (HOSPITAL_COMMUNITY): Payer: Commercial Managed Care - PPO | Admitting: Psychiatry

## 2010-07-24 ENCOUNTER — Encounter (INDEPENDENT_AMBULATORY_CARE_PROVIDER_SITE_OTHER): Payer: Commercial Managed Care - PPO | Admitting: Psychiatry

## 2010-07-24 DIAGNOSIS — F332 Major depressive disorder, recurrent severe without psychotic features: Secondary | ICD-10-CM

## 2010-08-03 ENCOUNTER — Encounter (HOSPITAL_COMMUNITY): Payer: Commercial Managed Care - PPO | Admitting: Psychiatry

## 2010-08-09 ENCOUNTER — Encounter (INDEPENDENT_AMBULATORY_CARE_PROVIDER_SITE_OTHER): Payer: Commercial Managed Care - PPO | Admitting: Psychiatry

## 2010-08-09 DIAGNOSIS — F3189 Other bipolar disorder: Secondary | ICD-10-CM

## 2010-08-20 ENCOUNTER — Encounter (INDEPENDENT_AMBULATORY_CARE_PROVIDER_SITE_OTHER): Payer: 59 | Admitting: Psychiatry

## 2010-08-20 DIAGNOSIS — F332 Major depressive disorder, recurrent severe without psychotic features: Secondary | ICD-10-CM

## 2010-09-03 ENCOUNTER — Encounter (INDEPENDENT_AMBULATORY_CARE_PROVIDER_SITE_OTHER): Payer: 59 | Admitting: Psychiatry

## 2010-09-03 DIAGNOSIS — F332 Major depressive disorder, recurrent severe without psychotic features: Secondary | ICD-10-CM

## 2010-09-17 ENCOUNTER — Encounter (INDEPENDENT_AMBULATORY_CARE_PROVIDER_SITE_OTHER): Payer: 59 | Admitting: Psychiatry

## 2010-09-17 DIAGNOSIS — F332 Major depressive disorder, recurrent severe without psychotic features: Secondary | ICD-10-CM

## 2010-09-20 ENCOUNTER — Encounter (INDEPENDENT_AMBULATORY_CARE_PROVIDER_SITE_OTHER): Payer: 59 | Admitting: Psychiatry

## 2010-09-20 DIAGNOSIS — F3189 Other bipolar disorder: Secondary | ICD-10-CM

## 2010-10-01 ENCOUNTER — Encounter (INDEPENDENT_AMBULATORY_CARE_PROVIDER_SITE_OTHER): Payer: 59 | Admitting: Psychiatry

## 2010-10-01 DIAGNOSIS — F332 Major depressive disorder, recurrent severe without psychotic features: Secondary | ICD-10-CM

## 2010-10-22 ENCOUNTER — Encounter (INDEPENDENT_AMBULATORY_CARE_PROVIDER_SITE_OTHER): Payer: 59 | Admitting: Psychiatry

## 2010-10-22 DIAGNOSIS — F332 Major depressive disorder, recurrent severe without psychotic features: Secondary | ICD-10-CM

## 2010-11-06 ENCOUNTER — Encounter (INDEPENDENT_AMBULATORY_CARE_PROVIDER_SITE_OTHER): Payer: Medicaid Other | Admitting: Psychiatry

## 2010-11-06 DIAGNOSIS — F332 Major depressive disorder, recurrent severe without psychotic features: Secondary | ICD-10-CM

## 2010-11-19 ENCOUNTER — Encounter (INDEPENDENT_AMBULATORY_CARE_PROVIDER_SITE_OTHER): Payer: 59 | Admitting: Psychiatry

## 2010-11-19 DIAGNOSIS — F332 Major depressive disorder, recurrent severe without psychotic features: Secondary | ICD-10-CM

## 2010-11-20 ENCOUNTER — Encounter (HOSPITAL_COMMUNITY): Payer: 59 | Admitting: Psychiatry

## 2010-11-23 LAB — CULTURE, ROUTINE-ABSCESS: Gram Stain: NONE SEEN

## 2010-11-29 ENCOUNTER — Encounter (INDEPENDENT_AMBULATORY_CARE_PROVIDER_SITE_OTHER): Payer: Medicaid Other | Admitting: Psychiatry

## 2010-11-29 DIAGNOSIS — F3189 Other bipolar disorder: Secondary | ICD-10-CM

## 2010-12-07 LAB — CBC
HCT: 31.6 — ABNORMAL LOW
HCT: 33.3 — ABNORMAL LOW
Hemoglobin: 11 — ABNORMAL LOW
Hemoglobin: 11.4 — ABNORMAL LOW
MCHC: 34.2
MCHC: 34.9
MCV: 90.3
MCV: 90.7
Platelets: 223
Platelets: 231
RBC: 3.5 — ABNORMAL LOW
RBC: 3.68 — ABNORMAL LOW
RDW: 13
RDW: 13.2
WBC: 11.6 — ABNORMAL HIGH
WBC: 9.3

## 2010-12-10 ENCOUNTER — Encounter (INDEPENDENT_AMBULATORY_CARE_PROVIDER_SITE_OTHER): Payer: Medicaid Other | Admitting: Psychiatry

## 2010-12-10 DIAGNOSIS — F332 Major depressive disorder, recurrent severe without psychotic features: Secondary | ICD-10-CM

## 2010-12-10 LAB — CBC
HCT: 35 — ABNORMAL LOW
Hemoglobin: 12.3
MCHC: 35
MCV: 89.6
Platelets: 285
RBC: 3.91
RDW: 13.1
WBC: 8.4

## 2010-12-10 LAB — RPR: RPR Ser Ql: NONREACTIVE

## 2010-12-17 LAB — CBC
HCT: 30.2 — ABNORMAL LOW
Hemoglobin: 10.4 — ABNORMAL LOW
MCHC: 34.5
MCV: 88.9
Platelets: 241
RBC: 3.4 — ABNORMAL LOW
RDW: 13.4
WBC: 7.5

## 2010-12-17 LAB — HCG, QUANTITATIVE, PREGNANCY: hCG, Beta Chain, Quant, S: 16024 — ABNORMAL HIGH

## 2010-12-17 LAB — BASIC METABOLIC PANEL
BUN: 8
CO2: 25
Calcium: 9
Chloride: 107
Creatinine, Ser: 0.35 — ABNORMAL LOW
Glucose, Bld: 82
Potassium: 3.7
Sodium: 137

## 2010-12-17 LAB — DIFFERENTIAL
Basophils Absolute: 0
Basophils Relative: 0
Eosinophils Absolute: 0
Eosinophils Relative: 1
Lymphocytes Relative: 25
Lymphs Abs: 1.9
Monocytes Absolute: 0.5
Monocytes Relative: 6
Neutro Abs: 5.1
Neutrophils Relative %: 68

## 2010-12-17 LAB — URINALYSIS, ROUTINE W REFLEX MICROSCOPIC
Bilirubin Urine: NEGATIVE
Glucose, UA: NEGATIVE
Hgb urine dipstick: NEGATIVE
Ketones, ur: NEGATIVE
Nitrite: NEGATIVE
Protein, ur: NEGATIVE
Specific Gravity, Urine: <1.005 — ABNORMAL LOW
Urobilinogen, UA: 0.2
pH: 6.5

## 2010-12-24 ENCOUNTER — Other Ambulatory Visit (HOSPITAL_COMMUNITY)
Admission: RE | Admit: 2010-12-24 | Discharge: 2010-12-24 | Disposition: A | Discharge status: Home / Self Care | Payer: Medicaid Other | Source: Ambulatory Visit | Attending: Obstetrics and Gynecology | Admitting: Obstetrics and Gynecology

## 2010-12-24 ENCOUNTER — Other Ambulatory Visit: Payer: Self-pay | Admitting: Adult Health

## 2010-12-24 DIAGNOSIS — Z01419 Encounter for gynecological examination (general) (routine) without abnormal findings: Secondary | ICD-10-CM | POA: Insufficient documentation

## 2010-12-27 ENCOUNTER — Encounter (INDEPENDENT_AMBULATORY_CARE_PROVIDER_SITE_OTHER): Payer: Medicaid Other | Admitting: Psychiatry

## 2010-12-27 DIAGNOSIS — F3189 Other bipolar disorder: Secondary | ICD-10-CM

## 2010-12-31 ENCOUNTER — Encounter (INDEPENDENT_AMBULATORY_CARE_PROVIDER_SITE_OTHER): Payer: Medicaid Other | Admitting: Psychiatry

## 2010-12-31 DIAGNOSIS — F332 Major depressive disorder, recurrent severe without psychotic features: Secondary | ICD-10-CM

## 2011-01-13 ENCOUNTER — Other Ambulatory Visit: Payer: Self-pay

## 2011-01-13 ENCOUNTER — Emergency Department (HOSPITAL_COMMUNITY)
Admission: EM | Admit: 2011-01-13 | Discharge: 2011-01-14 | Disposition: A | Discharge status: Home / Self Care | Payer: Medicaid Other | Attending: Emergency Medicine | Admitting: Emergency Medicine

## 2011-01-13 ENCOUNTER — Encounter: Payer: Self-pay | Admitting: Emergency Medicine

## 2011-01-13 DIAGNOSIS — T50901A Poisoning by unspecified drugs, medicaments and biological substances, accidental (unintentional), initial encounter: Secondary | ICD-10-CM

## 2011-01-13 DIAGNOSIS — F319 Bipolar disorder, unspecified: Secondary | ICD-10-CM | POA: Insufficient documentation

## 2011-01-13 DIAGNOSIS — M545 Low back pain, unspecified: Secondary | ICD-10-CM | POA: Insufficient documentation

## 2011-01-13 DIAGNOSIS — T400X1A Poisoning by opium, accidental (unintentional), initial encounter: Secondary | ICD-10-CM | POA: Insufficient documentation

## 2011-01-13 DIAGNOSIS — R000 Tachycardia, unspecified: Secondary | ICD-10-CM | POA: Insufficient documentation

## 2011-01-13 DIAGNOSIS — I517 Cardiomegaly: Secondary | ICD-10-CM | POA: Insufficient documentation

## 2011-01-13 HISTORY — DX: Bipolar disorder, unspecified: F31.9

## 2011-01-13 LAB — BASIC METABOLIC PANEL
BUN: 11 mg/dL (ref 6–23)
CO2: 28 mEq/L (ref 19–32)
Calcium: 9.4 mg/dL (ref 8.4–10.5)
Chloride: 99 mEq/L (ref 96–112)
Creatinine, Ser: 0.73 mg/dL (ref 0.50–1.10)
GFR calc Af Amer: >90 mL/min (ref >90)
GFR calc non Af Amer: >90 mL/min (ref >90)
Glucose, Bld: 86 mg/dL (ref 70–99)
Potassium: 4.2 mEq/L (ref 3.5–5.1)
Sodium: 133 mEq/L — ABNORMAL LOW (ref 135–145)

## 2011-01-13 LAB — DIFFERENTIAL
Basophils Absolute: 0 10*3/uL (ref 0.0–0.1)
Basophils Relative: 1 % (ref 0–1)
Eosinophils Absolute: 0.1 10*3/uL (ref 0.0–0.7)
Eosinophils Relative: 2 % (ref 0–5)
Lymphocytes Relative: 38 % (ref 12–46)
Lymphs Abs: 1.7 10*3/uL (ref 0.7–4.0)
Monocytes Absolute: 0.2 10*3/uL (ref 0.1–1.0)
Monocytes Relative: 5 % (ref 3–12)
Neutro Abs: 2.4 10*3/uL (ref 1.7–7.7)
Neutrophils Relative %: 55 % (ref 43–77)

## 2011-01-13 LAB — CBC
HCT: 36.7 % (ref 36.0–46.0)
Hemoglobin: 11.9 g/dL — ABNORMAL LOW (ref 12.0–15.0)
MCH: 31.2 pg (ref 26.0–34.0)
MCHC: 32.4 g/dL (ref 30.0–36.0)
MCV: 96.3 fL (ref 78.0–100.0)
Platelets: 269 10*3/uL (ref 150–400)
RBC: 3.81 MIL/uL — ABNORMAL LOW (ref 3.87–5.11)
RDW: 12.4 % (ref 11.5–15.5)
WBC: 4.4 10*3/uL (ref 4.0–10.5)

## 2011-01-13 LAB — PREGNANCY, URINE: Preg Test, Ur: NEGATIVE

## 2011-01-13 MED ORDER — CYPROHEPTADINE HCL 4 MG PO TABS
12.0000 mg | ORAL_TABLET | Freq: Once | ORAL | Status: AC
  Administered 2011-01-13: 12 mg via ORAL
  Filled 2011-01-13: qty 3

## 2011-01-13 NOTE — ED Notes (Signed)
Patient was found by mother to be altered mental status.  Per EMS, patient doesn't remember anything from today. Patient states she doesn't remember going to bed.  Patient denies any drugs or alcohol.

## 2011-01-13 NOTE — ED Provider Notes (Signed)
History   This chart was scribed for Hanley Seamen, MD by Clarita Crane. The patient was seen in room [facility] and the patient's care was started at 10:52PM.   CSN: [id number] Arrival date & time: 01/13/2011 10:32 PM   First MD Initiated Contact with Patient 01/13/11 2250      Chief Complaint  Patient presents with  . Accidental Drug Overdose   HPI Tonya Harmon is a 20 y.o. female BIB EMS who presents to the Emergency Department after accidental drug overdose just prior to arrival. Patient states she was experiencing constant lower back pain today and was taking Tramadol-50mg  throughout the day to help alleviate pain with no effect thus she continued to consume more. Patient states she is unsure how many Tramadol-50mg  pills she consumed. Reports that, according to her mother, she was found unresponsive by her mother at home just prior to arrival of EMS. Denies SI, nausea, vomiting, diarrhea, HA, palpitations. Patient is currently on Prozac. Denies elicit drug abuse and EtOH use. Patient with h/o Bipolar 1 disorder.  Past Medical History  Diagnosis Date  . Bipolar 1 disorder     Past Surgical History  Procedure Date  . Cesarean section     No family history on file.  History  Substance Use Topics  . Smoking status: Never Smoker   . Smokeless tobacco: Not on file  . Alcohol Use: No    OB History    Grav Para Term Preterm Abortions TAB SAB Ect Mult Living                  Review of Systems 10 Systems reviewed and are negative for acute change except as noted in the HPI.  Allergies  Clonazepam and Vistaril  Home Medications  No current outpatient prescriptions on file.  BP 134/90  Pulse 144  Temp(Src) 98.4 F (36.9 C) (Oral)  Resp 20  Ht 5\' 5"  (1.651 m)  Wt 160 lb (72.576 kg)  BMI 26.63 kg/m2  SpO2 100%  LMP 11/13/2010  Physical Exam  Nursing note and vitals reviewed. Constitutional: She is oriented to person, place, and time. She appears  well-developed and well-nourished. No distress.       Tearful.   HENT:  Head: Normocephalic and atraumatic.       Oropharynx clear.   Eyes: EOM are normal. Pupils are equal, round, and reactive to light.  Neck: Neck supple. No tracheal deviation present.  Cardiovascular: Regular rhythm.  Tachycardia present.   No murmur heard. Pulmonary/Chest: Effort normal. No respiratory distress. She has no wheezes.  Abdominal: Soft. She exhibits no distension. There is no tenderness.  Musculoskeletal: Normal range of motion. She exhibits no edema.  Neurological: She is alert and oriented to person, place, and time. No sensory deficit. She displays a negative Romberg sign.       Normal finger to nose. Hyper-reflexic DTRs.   Skin: Skin is warm and dry.  Psychiatric: Her behavior is normal. She expresses no suicidal ideation.       Tearful     ED Course  Procedures (including critical care time)  DIAGNOSTIC STUDIES: Oxygen Saturation is 100% on room air, normal by my interpretation.    COORDINATION OF CARE:    Labs Reviewed - No data to display No results found.  EKG Interpretation:  Date & Time: 01/13/2011 10:31 PM  Rate: 128  Rhythm: sinus tachycardia  QRS Axis: normal  Intervals: normal  ST/T Wave abnormalities: normal  Conduction Disutrbances:none  Narrative Interpretation:  P-waves consistent with left atrial enlargement  Old EKG Reviewed: unchanged  No diagnosis found.    MDM  Combination of prozac with tramadol concerning for serotonin syndrome. Symptoms appear mild at this time. She is not hyperpyrexic. Will treat with oral cyproheptadine.     I personally performed the services described in this documentation, which was scribed in my presence.  The recorded information has been reviewed and considered.  Nursing notes and vitals signs, including pulse oximetry, reviewed.  Summary of this visit's results, reviewed by myself:  Labs:  Results for orders placed during  the hospital encounter of 01/13/11  CBC      Component Value Range   WBC 4.4  4.0 - 10.5 (K/uL)   RBC 3.81 (*) 3.87 - 5.11 (MIL/uL)   Hemoglobin 11.9 (*) 12.0 - 15.0 (g/dL)   HCT 16.1  09.6 - 04.5 (%)   MCV 96.3  78.0 - 100.0 (fL)   MCH 31.2  26.0 - 34.0 (pg)   MCHC 32.4  30.0 - 36.0 (g/dL)   RDW 40.9  81.1 - 91.4 (%)   Platelets 269  150 - 400 (K/uL)  DIFFERENTIAL      Component Value Range   Neutrophils Relative 55  43 - 77 (%)   Neutro Abs 2.4  1.7 - 7.7 (K/uL)   Lymphocytes Relative 38  12 - 46 (%)   Lymphs Abs 1.7  0.7 - 4.0 (K/uL)   Monocytes Relative 5  3 - 12 (%)   Monocytes Absolute 0.2  0.1 - 1.0 (K/uL)   Eosinophils Relative 2  0 - 5 (%)   Eosinophils Absolute 0.1  0.0 - 0.7 (K/uL)   Basophils Relative 1  0 - 1 (%)   Basophils Absolute 0.0  0.0 - 0.1 (K/uL)  BASIC METABOLIC PANEL      Component Value Range   Sodium 133 (*) 135 - 145 (mEq/L)   Potassium 4.2  3.5 - 5.1 (mEq/L)   Chloride 99  96 - 112 (mEq/L)   CO2 28  19 - 32 (mEq/L)   Glucose, Bld 86  70 - 99 (mg/dL)   BUN 11  6 - 23 (mg/dL)   Creatinine, Ser 7.82  0.50 - 1.10 (mg/dL)   Calcium 9.4  8.4 - 95.6 (mg/dL)   GFR calc non Af Amer >90  >90 (mL/min)   GFR calc Af Amer >90  >90 (mL/min)  URINE RAPID DRUG SCREEN (HOSP PERFORMED)      Component Value Range   Opiates NONE DETECTED  NONE DETECTED    Cocaine NONE DETECTED  NONE DETECTED    Benzodiazepines NONE DETECTED  NONE DETECTED    Amphetamines NONE DETECTED  NONE DETECTED    Tetrahydrocannabinol NONE DETECTED  NONE DETECTED    Barbiturates NONE DETECTED  NONE DETECTED   PREGNANCY, URINE      Component Value Range   Preg Test, Ur NEGATIVE     2:55 AM The patient has remained awake and alert while being observed in the emergency department for over 4 hours. She has remained afebrile, her temperature currently is 98.6. Her tachycardia has resolved at rest although she does become tachycardic when active. It is unclear if this represented mild serotonin  syndrome or simply the toxic effects of tramadol overdose. Her mother will observe her for the next 24 hours. She was advised to check her temperature every 4 hours or if symptomatic. She was advised to return her to the ED for mental status changes, muscle  rigidity, fever or other worsening symptoms. She was advised to discontinue her Prozac for the next several days until she can followup with her primary care physician.     Hanley Seamen, MD 01/14/11 [phone]

## 2011-01-14 LAB — RAPID URINE DRUG SCREEN, HOSP PERFORMED
Amphetamines: NOT DETECTED
Barbiturates: NOT DETECTED
Benzodiazepines: NOT DETECTED
Cocaine: NOT DETECTED
Opiates: NOT DETECTED
Tetrahydrocannabinol: NOT DETECTED

## 2011-01-14 NOTE — ED Notes (Signed)
Patient resting in bed.  Dr. Read Drivers at bedside to speak with patient.

## 2011-01-21 ENCOUNTER — Ambulatory Visit (INDEPENDENT_AMBULATORY_CARE_PROVIDER_SITE_OTHER): Payer: Medicaid Other | Admitting: Psychiatry

## 2011-01-21 ENCOUNTER — Encounter (HOSPITAL_COMMUNITY): Payer: Self-pay | Admitting: Psychiatry

## 2011-01-21 DIAGNOSIS — F332 Major depressive disorder, recurrent severe without psychotic features: Secondary | ICD-10-CM
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
## 2011-01-21 NOTE — Patient Instructions (Signed)
Maintain efforts regarding self-care

## 2011-02-07 ENCOUNTER — Encounter (HOSPITAL_COMMUNITY): Payer: Self-pay | Admitting: Psychiatry

## 2011-02-07 ENCOUNTER — Ambulatory Visit (INDEPENDENT_AMBULATORY_CARE_PROVIDER_SITE_OTHER): Payer: Medicaid Other | Admitting: Psychiatry

## 2011-02-07 ENCOUNTER — Encounter (HOSPITAL_COMMUNITY): Payer: Medicaid Other | Admitting: Psychiatry

## 2011-02-07 VITALS — Wt 160.0 lb

## 2011-02-07 DIAGNOSIS — F3189 Other bipolar disorder: Secondary | ICD-10-CM

## 2011-02-07 MED ORDER — HALOPERIDOL 2 MG PO TABS: 2.0000 mg | ORAL_TABLET | Freq: Every day | ORAL | Status: AC

## 2011-02-07 MED ORDER — BENZTROPINE MESYLATE 1 MG PO TABS: 0.5000 mg | ORAL_TABLET | Freq: Every day | ORAL | Status: DC

## 2011-02-07 NOTE — Progress Notes (Signed)
Patient came for her followup appointment. She continued to endorse poor sleep and racing thoughts at bedtime. She does not want to take more Risperdal due to weight gain. She has lost weight since her last visit. She is more involved in walking and watching her diet. In the past she had tried Vistaril Depakote trazodone with limited response. She wants to try Haldol that can help her sleep paranoia and mood swing. Recently she denies any agitation anger but very upset about insomnia. She is reporting no side effects of Risperdal at this time.  Mental status examination Patient is casually dressed with good eye contact. Her speech is soft clear and coherent. Her thought process logical linear goal-directed. She described her mood is anxious and her affect is mood congruent. She denies any active or passive suicidal thoughts or homicidal thoughts. She denies any auditory or visual hallucination. She's alert and oriented x3. Her insight judgment and impulse control is okay.  Assessment Bipolar disorder NOS  Plan I will discontinue Risperdal and restart Haldol 2 mg at bedtime. I explained risks and benefits especially extrapyramidal side effects with Haldol. I recommended if she does not feel better with Haldol continue to call us immediately. She will start Cogentin 0.5 mg at bedtime. I will see her again in 2 months

## 2011-02-11 ENCOUNTER — Ambulatory Visit (INDEPENDENT_AMBULATORY_CARE_PROVIDER_SITE_OTHER): Payer: Medicaid Other | Admitting: Psychiatry

## 2011-02-11 ENCOUNTER — Encounter (HOSPITAL_COMMUNITY): Payer: Self-pay | Admitting: Psychiatry

## 2011-02-11 DIAGNOSIS — F319 Bipolar disorder, unspecified: Secondary | ICD-10-CM

## 2011-02-11 NOTE — Patient Instructions (Signed)
Continue self care efforts.

## 2011-02-11 NOTE — Progress Notes (Signed)
Patient:  Tonya Harmon   DOB: November 08, 1990  MR Number: [id number]  Location: [facility]:  [address] [place],  [place], [address]  Start: Monday 02/11/2011  11:30 AM End: Monday 02/11/2011  12:30PM  Provider/Observer:     Florencia Reasons, MSW, LCSW   Chief Complaint:      Chief Complaint  Patient presents with  . Depression  . Anxiety    Reason For Service:     The patient was referred for follow up services upon discharge from hospitalization at the [facility] where she was treated for suicidal ideation and depression. Patient continues to experience recurrent periods of depression.  Interventions Strategy:  Supportive therapy, cognitive behavioral therapy  Participation Level:   Active  Participation Quality:  Appropriate      Behavioral Observation:  Fairly Groomed, Alert, and Appropriate  Current Psychosocial Factors: The patient reports marital stress as her husband remains unemployed resulting in  continued financial stress for the family.   Content of Session:   Reviewing symptoms, identifying stressors, identify ways to set and maintaining boundaries, identifying relaxation and coping techniques, reviewing the treatment plan  Current Status:   The patient reports improved sleep pattern, improved mood, and decreased hyperactivity.  Patient Progress:   Good. Patient reports feeling better her since taking Haldol as prescribed by Dr. Lolly Mustache. She reports improved sleep pattern and improved mood. Patient is pleased with her self-care efforts regarding exercise and eating patterns. She has lost 8 pounds. She expresses frustration with her husband as her parents recently assisted her husband in making child support payments. Patient states that her husband does not seem to care about the possible results of not making payments. She also expresses frustration with husband as he recently told his biological family about patient's recent drug  overdose. She has been assertive with her husband regarding her concerns about dizziness. Patient is pleased that her oldest son continues to attend daycare. There is also is patient sometimes for self. Patient is a glad that her mood has improved but states it doesn't take much to bring down her mood. She reports continued mood swings.  Target Goals:   1. Improve mood and experience pleasure and humor, maintain stability in mood.  t2. Decrease negative thought patterns and increase positive thought patterns. 4. 1. Improve  assertiveness skills and ability to set and maintain boundaries.  Last Reviewed:   02/11/2011  Goals Addressed Today:    Improve  ability to set and maintain boundaries.  Impression/Diagnosis:   The patient presents with a history of depression beginning in adolescence and includes postpartum depression after the births of HER-2 children. The patient continues to experience recurrent periods of depression. She also is experiencing mood swings. Diagnosis:  Bipolar disorder  Diagnosis:  Axis I:  1. Bipolar disorder             Axis II: Deferred

## 2011-03-06 ENCOUNTER — Other Ambulatory Visit (HOSPITAL_COMMUNITY): Payer: Self-pay | Admitting: Family Medicine

## 2011-03-06 ENCOUNTER — Ambulatory Visit (HOSPITAL_COMMUNITY)
Admission: RE | Admit: 2011-03-06 | Discharge: 2011-03-06 | Disposition: A | Discharge status: Home / Self Care | Payer: Medicaid Other | Source: Ambulatory Visit | Attending: Family Medicine | Admitting: Family Medicine

## 2011-03-06 DIAGNOSIS — M545 Low back pain, unspecified: Secondary | ICD-10-CM | POA: Insufficient documentation

## 2011-03-06 DIAGNOSIS — M5430 Sciatica, unspecified side: Secondary | ICD-10-CM

## 2011-03-06 DIAGNOSIS — M25559 Pain in unspecified hip: Secondary | ICD-10-CM | POA: Insufficient documentation

## 2011-03-14 ENCOUNTER — Ambulatory Visit (INDEPENDENT_AMBULATORY_CARE_PROVIDER_SITE_OTHER): Payer: Medicaid Other | Admitting: Psychiatry

## 2011-03-14 ENCOUNTER — Encounter (HOSPITAL_COMMUNITY): Payer: Self-pay | Admitting: Psychiatry

## 2011-03-14 DIAGNOSIS — F319 Bipolar disorder, unspecified: Secondary | ICD-10-CM

## 2011-03-14 NOTE — Progress Notes (Signed)
Patient:  Tonya Harmon   DOB: 30-Apr-1990  MR Number: [id number]  Location: [facility]:  [address] [place],  [place], [address]  Start: Thursday 03/14/2011 10:40 AM End: Thursday 03/14/2011 11:25 AM  Provider/Observer:     Florencia Reasons, MSW, LCSW   Chief Complaint:      Chief Complaint  Patient presents with  . Depression  . Anxiety    Reason For Service:     The patient was referred for follow up services upon discharge from hospitalization at the [facility] where she was treated for suicidal ideation and depression. Patient continues to experience recurrent periods of depression.  Interventions Strategy:  Supportive therapy, cognitive behavioral therapy  Participation Level:   Active  Participation Quality:  Appropriate      Behavioral Observation:  Fairly Groomed, Alert, and tearful  Current Psychosocial Factors:  1. The patient reports she and husband had an argument last night as patient phone the phone number of another woman on the phone her husband was using. 2. 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  Content of Session:   Reviewing symptoms, processing patient's fieelings, identifying ways to increase distress tolerance, identifying coping and relaxation techniques, identifying areas within patient's control  Current Status:   The patient reports improved sleep pattern but increased sadness. She reports she has not had any mood swings. She denies any suicidal and homicidal ideations.  Patient Progress:   Good. Patient reports she hasn't taken any psychotropic medications. She reports she has had no mood swings. She has begun taking Ambien and reports sleeping much better. Patient also has improved her self-care regarding nutrition and exercise. She expresses sadness about the death of her former 25-Aug-2022 school teacher. She expresses anger and frustration regarding her relationship  with her husband as she recently found the phone number of another woman on the phone her husband was using. When she confronted her husband, he denied involvement with another woman and told the patient he did not owe her any explanations. Patient attributes his response to patient disclosing to husband in September 2010 that she had committed infidelity with her oldest son's father and oldest son's father bringing gifts to his son on Christmas day. Patient reports feeling rejected by her husband  but reports improved ability to manage emotional pain. She denies any desires for self-injurious behaviors and reports using relaxation breathing and self talk to cope. Patient reports trying to focus her energy and efforts on her children.  Target Goals:   1. Improve mood and experience pleasure and humor, maintain stability in mood.  2. Decrease negative thought patterns and increase positive thought patterns. 4. 1. Improve  assertiveness skills and ability to set and maintain boundaries.  Last Reviewed:   02/11/2011  Goals Addressed Today:    Improve mood, maintain stability in mood  Impression/Diagnosis:   The patient presents with a history of depression beginning in adolescence and includes postpartum depression after the births of HER-2 children. The patient continues to experience recurrent periods of depression. She also is experiencing mood swings. Diagnosis:  Bipolar disorder  Diagnosis:  Axis I:  1. Bipolar disorder             Axis II: Deferred

## 2011-03-14 NOTE — Patient Instructions (Signed)
Discussed orally 

## 2011-03-28 ENCOUNTER — Ambulatory Visit (HOSPITAL_COMMUNITY): Payer: Medicaid Other | Admitting: Psychiatry

## 2011-03-31 ENCOUNTER — Encounter (HOSPITAL_COMMUNITY): Payer: Self-pay | Admitting: *Deleted

## 2011-03-31 ENCOUNTER — Emergency Department (HOSPITAL_COMMUNITY)
Admission: EM | Admit: 2011-03-31 | Discharge: 2011-03-31 | Disposition: A | Discharge status: Home / Self Care | Payer: Medicaid Other | Attending: Emergency Medicine | Admitting: Emergency Medicine

## 2011-03-31 DIAGNOSIS — M5430 Sciatica, unspecified side: Secondary | ICD-10-CM | POA: Insufficient documentation

## 2011-03-31 DIAGNOSIS — M79605 Pain in left leg: Secondary | ICD-10-CM

## 2011-03-31 DIAGNOSIS — M545 Low back pain, unspecified: Secondary | ICD-10-CM | POA: Insufficient documentation

## 2011-03-31 DIAGNOSIS — F319 Bipolar disorder, unspecified: Secondary | ICD-10-CM | POA: Insufficient documentation

## 2011-03-31 MED ORDER — PREDNISONE 20 MG PO TABS
60.0000 mg | ORAL_TABLET | Freq: Once | ORAL | Status: AC
  Administered 2011-03-31: 60 mg via ORAL
  Filled 2011-03-31: qty 3

## 2011-03-31 MED ORDER — FAMOTIDINE 20 MG PO TABS
20.0000 mg | ORAL_TABLET | Freq: Once | ORAL | Status: AC
  Administered 2011-03-31: 20 mg via ORAL
  Filled 2011-03-31: qty 1

## 2011-03-31 MED ORDER — LORAZEPAM 1 MG PO TABS: 1.0000 mg | ORAL_TABLET | Freq: Three times a day (TID) | ORAL | Status: AC | PRN

## 2011-03-31 MED ORDER — HYDROCODONE-ACETAMINOPHEN 5-325 MG PO TABS
1.0000 | ORAL_TABLET | Freq: Once | ORAL | Status: AC
  Administered 2011-03-31: 1 via ORAL
  Filled 2011-03-31: qty 1

## 2011-03-31 MED ORDER — HYDROCODONE-ACETAMINOPHEN 5-325 MG PO TABS: 1.0000 | ORAL_TABLET | Freq: Four times a day (QID) | ORAL | Status: AC | PRN

## 2011-03-31 MED ORDER — PREDNISONE 50 MG PO TABS: ORAL_TABLET | ORAL | Status: AC

## 2011-03-31 MED ORDER — LORAZEPAM 1 MG PO TABS: 1.0000 mg | ORAL_TABLET | Freq: Three times a day (TID) | ORAL | Status: AC

## 2011-03-31 MED ORDER — DIPHENHYDRAMINE HCL 25 MG PO CAPS
50.0000 mg | ORAL_CAPSULE | Freq: Once | ORAL | Status: AC
  Administered 2011-03-31: 50 mg via ORAL
  Filled 2011-03-31: qty 2

## 2011-03-31 NOTE — ED Notes (Signed)
Patient reports taking ambien last night and that the medication made her forget that she had previously taken 2 tramadol. Patient took another dose of tramadol. Woke this morning with "itching all over" Airway patent and patient denies shortness of breath.   Patient reports tramadol was filled 5 days ago and was prescribed for her to take 2 tramadol q6h prn for pain. Reports 34 tabs missing from bottle from 5 days of use. Unknown how much patient took of tramadol last night.

## 2011-03-31 NOTE — ED Notes (Signed)
Pt c/o allergic rxn from medication. Pt states she took an extra tramadol last pm and woke this am itching.

## 2011-03-31 NOTE — ED Provider Notes (Signed)
History     CSN: [id number]  Arrival date & time 03/31/11  0800   First MD Initiated Contact with Patient 03/31/11 0813      Chief Complaint  Patient presents with  . Allergic Reaction    (Consider location/radiation/quality/duration/timing/severity/associated sxs/prior treatment) HPI Comments: Pt has been taking ultram (total 8 tabs daily) as prescribed by dr. Sudie Bailey for L sciatica without improvement.  She took her Remus Loffler before bed last per usual.  When she awakened today she is concerned that she may have taken one or more additional doses.  She is still hurting but states that she itches all over.   She is not having any trouble breathing or swallowing.  She has not taken any additional meds.  Her next appt with dr. Sudie Bailey is ~ 04-28-11.  She has not yet had an MRI.  Patient is a 21 y.o. female presenting with allergic reaction. The history is provided by the patient. No language interpreter was used.  Allergic Reaction The primary symptoms do not include wheezing, shortness of breath, cough, nausea, vomiting, altered mental status, rash or urticaria. Primary symptoms comment: generalized itching The current episode started less than 1 hour ago. The problem has not changed since onset. The onset of the reaction was associated with a new medication.    Past Medical History  Diagnosis Date  . Bipolar 1 disorder   . Depression   . Anxiety   . Bipolar disorder     Past Surgical History  Procedure Date  . Cesarean section     History reviewed. No pertinent family history.  History  Substance Use Topics  . Smoking status: Never Smoker   . Smokeless tobacco: Never Used  . Alcohol Use: No    OB History    Grav Para Term Preterm Abortions TAB SAB Ect Mult Living                  Review of Systems  Respiratory: Negative for cough, shortness of breath and wheezing.   Gastrointestinal: Negative for nausea and vomiting.  Musculoskeletal: Positive for back pain.    Skin: Negative for rash.  Psychiatric/Behavioral: Negative for altered mental status.  All other systems reviewed and are negative.    Allergies  Clonazepam and Vistaril  Home Medications   Current Outpatient Rx  Name Route Sig Dispense Refill  . BENZTROPINE MESYLATE 1 MG PO TABS Oral Take 0.5 tablets (0.5 mg total) by mouth at bedtime. 30 tablet 1  . PREDNISONE 10 MG PO TABS Oral Take 10 mg by mouth daily.    . TRAMADOL HCL 50 MG PO TABS Oral Take 50 mg by mouth every 8 (eight) hours as needed. Maximum dose= 8 tablets per day     . ZOLPIDEM TARTRATE 10 MG PO TABS Oral Take 10 mg by mouth at bedtime as needed.      BP 140/77  Pulse 83  Temp(Src) 98.3 F (36.8 C) (Oral)  Resp 21  Ht 5\' 5"  (1.651 m)  Wt 152 lb (68.947 kg)  BMI 25.29 kg/m2  SpO2 99%  Physical Exam  Nursing note and vitals reviewed. Constitutional: She is oriented to person, place, and time. She appears well-developed and well-nourished. No distress.  HENT:  Head: Normocephalic and atraumatic.  Eyes: EOM are normal.  Neck: Normal range of motion.  Cardiovascular: Normal rate, regular rhythm and normal heart sounds.   Pulmonary/Chest: Effort normal and breath sounds normal.  Abdominal: Soft. She exhibits no distension. There is no tenderness.  Musculoskeletal:       Lumbar back: She exhibits decreased range of motion, tenderness and pain. She exhibits no bony tenderness, no swelling, no edema, no deformity, no laceration, no spasm and normal pulse.       Back:       Exam c/w L sciatica.  Neurological: She is alert and oriented to person, place, and time. She has normal reflexes. No cranial nerve deficit or sensory deficit. Coordination and gait normal. GCS eye subscore is 4. GCS verbal subscore is 5. GCS motor subscore is 6.  Reflex Scores:      Patellar reflexes are 2+ on the right side and 2+ on the left side.      Achilles reflexes are 2+ on the right side and 2+ on the left side. Skin: Skin is warm  and dry.  Psychiatric: She has a normal mood and affect. Judgment normal.    ED Course  Procedures (including critical care time)  Labs Reviewed - No data to display No results found.   No diagnosis found.    MDM          Worthy Rancher, PA 03/31/11 0911  Worthy Rancher, PA 04/01/11 [phone]

## 2011-04-01 NOTE — ED Provider Notes (Signed)
Medical screening examination/treatment/procedure(s) were performed by non-physician practitioner and as supervising physician I was immediately available for consultation/collaboration.  Raylin Diguglielmo, MD 04/01/11 0844 

## 2011-04-11 ENCOUNTER — Ambulatory Visit (HOSPITAL_COMMUNITY): Payer: Medicaid Other | Admitting: Psychiatry

## 2011-04-23 ENCOUNTER — Other Ambulatory Visit (HOSPITAL_COMMUNITY): Payer: Self-pay | Admitting: Psychiatry

## 2011-04-24 ENCOUNTER — Other Ambulatory Visit (HOSPITAL_COMMUNITY): Payer: Self-pay | Admitting: Family Medicine

## 2011-04-24 DIAGNOSIS — M5430 Sciatica, unspecified side: Secondary | ICD-10-CM

## 2011-04-26 ENCOUNTER — Ambulatory Visit (HOSPITAL_COMMUNITY)
Admission: RE | Admit: 2011-04-26 | Discharge: 2011-04-26 | Disposition: A | Discharge status: Home / Self Care | Payer: Medicaid Other | Source: Ambulatory Visit | Attending: Family Medicine | Admitting: Family Medicine

## 2011-04-26 DIAGNOSIS — M5126 Other intervertebral disc displacement, lumbar region: Secondary | ICD-10-CM | POA: Insufficient documentation

## 2011-04-26 DIAGNOSIS — M545 Low back pain, unspecified: Secondary | ICD-10-CM | POA: Insufficient documentation

## 2011-04-26 DIAGNOSIS — M5430 Sciatica, unspecified side: Secondary | ICD-10-CM

## 2011-04-26 DIAGNOSIS — M25559 Pain in unspecified hip: Secondary | ICD-10-CM | POA: Insufficient documentation

## 2011-05-04 ENCOUNTER — Encounter (HOSPITAL_COMMUNITY): Payer: Self-pay | Admitting: *Deleted

## 2011-05-04 ENCOUNTER — Emergency Department (HOSPITAL_COMMUNITY)
Admission: EM | Admit: 2011-05-04 | Discharge: 2011-05-04 | Disposition: A | Discharge status: Home / Self Care | Payer: Medicaid Other | Attending: Emergency Medicine | Admitting: Emergency Medicine

## 2011-05-04 DIAGNOSIS — T7840XA Allergy, unspecified, initial encounter: Secondary | ICD-10-CM

## 2011-05-04 DIAGNOSIS — M62838 Other muscle spasm: Secondary | ICD-10-CM | POA: Insufficient documentation

## 2011-05-04 DIAGNOSIS — Z79899 Other long term (current) drug therapy: Secondary | ICD-10-CM | POA: Insufficient documentation

## 2011-05-04 DIAGNOSIS — F3130 Bipolar disorder, current episode depressed, mild or moderate severity, unspecified: Secondary | ICD-10-CM | POA: Insufficient documentation

## 2011-05-04 DIAGNOSIS — R1310 Dysphagia, unspecified: Secondary | ICD-10-CM | POA: Insufficient documentation

## 2011-05-04 DIAGNOSIS — I10 Essential (primary) hypertension: Secondary | ICD-10-CM | POA: Insufficient documentation

## 2011-05-04 NOTE — ED Provider Notes (Signed)
History    Scribed for Benny Lennert, MD, the patient was seen in room APA10/APA10. This chart was scribed by Katha Cabal.  Pt was seen at 6:10 PM    CSN: [id number]  Arrival date & time 05/04/11  1634   First MD Initiated Contact with Patient 05/04/11 1804      Chief Complaint  Patient presents with  . Spasms    (Consider location/radiation/quality/duration/timing/severity/associated sxs/prior treatment) HPI   Tonya Harmon is a 21 y.o. female who presents to the Emergency Department complaining of acute onset of muscle spasms prior to arrival.  Symptoms are gradually improving.  Mother reports patient began to have involuntary muscle spasms.    Patient took Tramadol at 1 PM.  Mother reports patient having difficulty swallowing.  Patient given 50mg  Benadryl at 3 PM.   Patient Hydol  last night to help with with sleep. Patient reports significant improvement in neck spasms.   Patient had MRI here for back pain after nerve was hit while getting Depo shot.     PCP Milana Obey, MD, MD     Allergies  Clonazepam; Flexeril; Naproxen; Suprax; Tramadol; and Vistaril   Past Medical History  Diagnosis Date  . Bipolar 1 disorder   . Depression   . Anxiety   . Bipolar disorder     Past Surgical History  Procedure Date  . Cesarean section     No family history on file.  History  Substance Use Topics  . Smoking status: Never Smoker   . Smokeless tobacco: Never Used  . Alcohol Use: No    OB History    Grav Para Term Preterm Abortions TAB SAB Ect Mult Living                  Review of Systems  Constitutional: Negative for fatigue.  HENT: Negative for congestion, sinus pressure and ear discharge.   Eyes: Negative for discharge.  Respiratory: Negative for cough.   Gastrointestinal: Negative for diarrhea.  Genitourinary: Negative for frequency and hematuria.  Musculoskeletal: Negative for back pain.  Skin: Negative for rash.  Hematological:  Negative.   Psychiatric/Behavioral: Negative for hallucinations.     Home Medications   Current Outpatient Rx  Name Route Sig Dispense Refill  . LEVONORGEST-ETH ESTRAD 91-DAY 0.15-0.03 &0.01 MG PO TABS Oral Take 1 tablet by mouth daily.    . TRAMADOL HCL 50 MG PO TABS Oral Take 50 mg by mouth every 8 (eight) hours as needed. For pain. Maximum dose= 8 tablets per day    . ZOLPIDEM TARTRATE 10 MG PO TABS Oral Take 10 mg by mouth at bedtime.       BP 131/83  Pulse 110  Temp(Src) 98.2 F (36.8 C) (Oral)  Resp 20  Ht 5\' 6"  (1.676 m)  Wt 148 lb (67.132 kg)  BMI 23.89 kg/m2  SpO2 100%  LMP 05/04/2011  Physical Exam  Constitutional: She is oriented to person, place, and time. She appears well-developed.  HENT:  Head: Normocephalic and atraumatic.  Eyes: Conjunctivae and EOM are normal. No scleral icterus.  Neck: Neck supple. No thyromegaly present.  Cardiovascular: Normal rate and regular rhythm.  Exam reveals no gallop and no friction rub.   No murmur heard. Pulmonary/Chest: No stridor. She has no wheezes. She has no rales. She exhibits no tenderness.  Abdominal: She exhibits no distension. There is no tenderness. There is no rebound.  Musculoskeletal: Normal range of motion. She exhibits no edema.  mild stiffening of neck and upper extremities    Lymphadenopathy:    She has no cervical adenopathy.  Neurological: She is alert and oriented to person, place, and time. No cranial nerve deficit. Coordination normal.  Skin: No rash noted. No erythema.  Psychiatric: She has a normal mood and affect. Her behavior is normal.    ED Course  Procedures (including critical care time)   DIAGNOSTIC STUDIES: Oxygen Saturation is 100% on room air, normal by my interpretation.     COORDINATION OF CARE: 6:17 PM  Physical exam complete.   Will check for MRI results.   6:25 PM  Plan to discharge patient.  Patient agrees with plan.      LABS / RADIOLOGY:  Labs Reviewed - No data  to display No results found.       MDM  Symptoms improved with benadryl.  Pt instructed not to take haldol or ultram again         MEDICATIONS GIVEN IN THE E.D. Scheduled Meds:   Continuous Infusions:       IMPRESSION: No diagnosis found.   NEW MEDICATIONS: New Prescriptions   No medications on file      The chart was scribed for me under my direct supervision.  I personally performed the history, physical, and medical decision making and all procedures in the evaluation of this patient.Benny Lennert, MD 05/04/11 [phone]

## 2011-05-04 NOTE — ED Notes (Signed)
Mother states involuntary muscle spasms to neck and hands, began 2 hours PTA.

## 2011-05-04 NOTE — Discharge Instructions (Signed)
Take motrin or tylenol for back pain.  Do not take any more haldol or ultram

## 2011-05-04 NOTE — ED Notes (Signed)
Mother reports previous problems with Tramadol. Pt with prior hx seizure. Pt states she took the med for back pain.

## 2011-05-04 NOTE — ED Notes (Signed)
Took Benadryl 50mg  po PTA.

## 2011-05-23 ENCOUNTER — Other Ambulatory Visit: Payer: Self-pay | Admitting: Family Medicine

## 2011-05-23 DIAGNOSIS — M5430 Sciatica, unspecified side: Secondary | ICD-10-CM

## 2011-05-27 ENCOUNTER — Ambulatory Visit
Admission: RE | Admit: 2011-05-27 | Discharge: 2011-05-27 | Disposition: A | Discharge status: Home / Self Care | Payer: Medicaid Other | Source: Ambulatory Visit | Attending: Family Medicine | Admitting: Family Medicine

## 2011-05-27 VITALS — BP 102/62 | HR 86 | Ht 65.0 in | Wt 142.0 lb

## 2011-05-27 DIAGNOSIS — M5430 Sciatica, unspecified side: Secondary | ICD-10-CM

## 2011-05-27 MED ORDER — METHYLPREDNISOLONE ACETATE 40 MG/ML INJ SUSP (RADIOLOG
120.0000 mg | Freq: Once | INTRAMUSCULAR | Status: AC
  Administered 2011-05-27: 120 mg via EPIDURAL

## 2011-05-27 MED ORDER — IOHEXOL 180 MG/ML  SOLN
1.0000 mL | Freq: Once | INTRAMUSCULAR | Status: AC | PRN
  Administered 2011-05-27: 1 mL via EPIDURAL

## 2011-05-27 NOTE — Discharge Instructions (Signed)

## 2011-10-03 LAB — OB RESULTS CONSOLE HIV ANTIBODY (ROUTINE TESTING): HIV: NONREACTIVE

## 2011-10-03 LAB — OB RESULTS CONSOLE ABO/RH: RH Type: POSITIVE

## 2011-10-03 LAB — OB RESULTS CONSOLE VARICELLA ZOSTER ANTIBODY, IGG: Varicella: IMMUNE

## 2011-10-03 LAB — OB RESULTS CONSOLE ANTIBODY SCREEN: Antibody Screen: NEGATIVE

## 2011-10-03 LAB — OB RESULTS CONSOLE GC/CHLAMYDIA
Chlamydia: NEGATIVE
Gonorrhea: NEGATIVE

## 2011-10-03 LAB — OB RESULTS CONSOLE RUBELLA ANTIBODY, IGM: Rubella: IMMUNE

## 2011-10-03 LAB — OB RESULTS CONSOLE HEPATITIS B SURFACE ANTIGEN: Hepatitis B Surface Ag: NEGATIVE

## 2011-10-03 LAB — CYSTIC FIBROSIS DIAGNOSTIC STUDY: Interpretation-CFDNA:: NEGATIVE

## 2011-10-03 LAB — OB RESULTS CONSOLE RPR: RPR: NONREACTIVE

## 2011-11-17 IMAGING — CT CT ANGIO CHEST
1 of 6 series · 5 of 36 positions shown · IV contrast (omnipaque)
Comparison: None.

CLINICAL DATA: Left-sided chest pain and shortness of breath on
exertion for 1 week.

CT ANGIOGRAPHY CHEST WITH CONTRAST
TECHNIQUE: Multidetector CT imaging of the chest was performed
using the standard protocol during bolus administration of
intravenous contrast.  Multiplanar CT image reconstructions
including MIPs were obtained to evaluate the vascular anatomy.
Contrast:  100 mL of Omnipaque 300 IV contrast

[Series 4: pe 3.0 b40f · axial · 0.64mm/px · z∈[-282,-90]mm · 5 of 98 slices shown]
[im 17/98  lung]
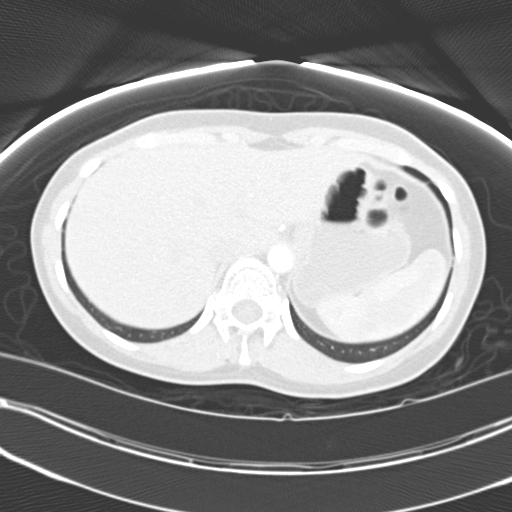
[im 33/98  mediastinal]
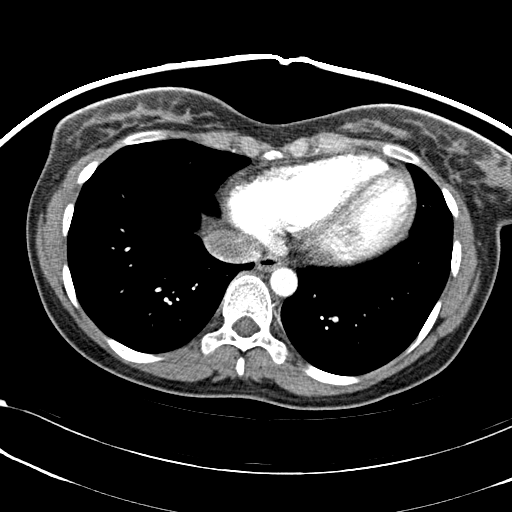
[im 49/98  lung]
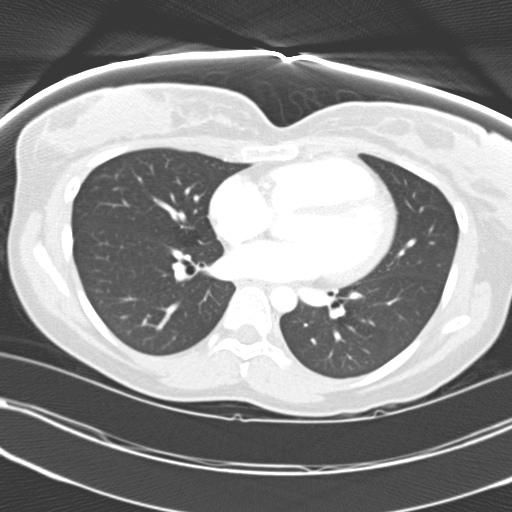
[im 65/98  mediastinal]
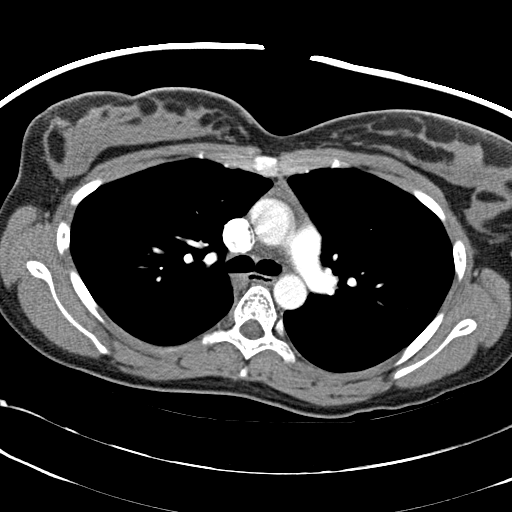
[im 81/98  lung]
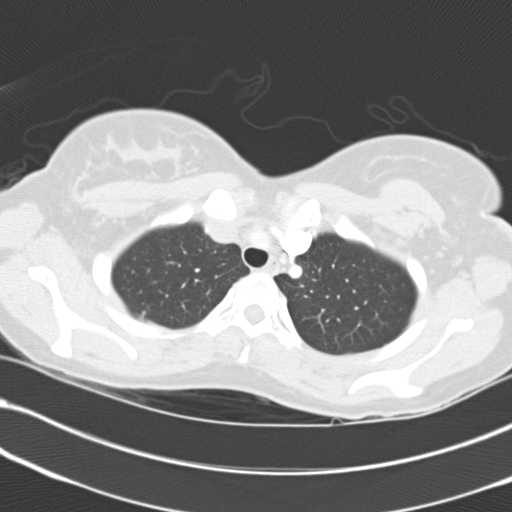

[5 of 36 positions shown; findings below may reference images not displayed]

FINDINGS: There is no evidence of pulmonary embolus.

The lungs are essentially clear bilaterally.  A tiny nodular
density at the posterior right lung apex likely reflects
atelectasis.  There is no evidence of significant focal
consolidation, pleural effusion or pneumothorax.  No masses are
identified; no abnormal focal contrast enhancement is seen.

The mediastinum is unremarkable in appearance.  There is no
evidence of mediastinal lymphadenopathy.  No pericardial effusion
is seen.  The great vessels are unremarkable in appearance.  The
residual thymic tissue is within normal limits.  No axillary
lymphadenopathy is seen.  The visualized portions of the thyroid
gland are unremarkable in appearance.

The visualized portions of the liver and spleen are unremarkable.
The visualized portions of the pancreas, stomach, adrenal glands
and kidneys are within normal limits.

No acute osseous abnormalities are seen.

Review of the MIP images confirms the above findings.
IMPRESSION: 1.  No evidence of pulmonary embolus.
2.  Unremarkable CTA of the chest.

## 2012-02-24 LAB — OB RESULTS CONSOLE RPR: RPR: NONREACTIVE

## 2012-02-24 LAB — OB RESULTS CONSOLE HIV ANTIBODY (ROUTINE TESTING): HIV: NONREACTIVE

## 2012-04-25 ENCOUNTER — Inpatient Hospital Stay (HOSPITAL_COMMUNITY)
Admission: AD | Admit: 2012-04-25 | Discharge: 2012-04-25 | Disposition: A | Discharge status: Home / Self Care | Payer: Medicaid Other | Source: Ambulatory Visit | Attending: Family Medicine | Admitting: Family Medicine

## 2012-04-25 ENCOUNTER — Encounter (HOSPITAL_COMMUNITY): Payer: Self-pay | Admitting: Obstetrics and Gynecology

## 2012-04-25 DIAGNOSIS — O99891 Other specified diseases and conditions complicating pregnancy: Secondary | ICD-10-CM

## 2012-04-25 DIAGNOSIS — O368190 Decreased fetal movements, unspecified trimester, not applicable or unspecified: Secondary | ICD-10-CM

## 2012-04-25 DIAGNOSIS — IMO0001 Reserved for inherently not codable concepts without codable children: Secondary | ICD-10-CM

## 2012-04-25 LAB — POCT FERN TEST: POCT Fern Test: NEGATIVE

## 2012-04-25 NOTE — MAU Provider Note (Signed)
Chart reviewed and agree with management and plan.  

## 2012-04-25 NOTE — MAU Note (Signed)
Tonya Harmon is here today with ? ROM and decreased fetal movement. She had an [place] and was told she had Low amniotic fluid levels. She felt a small gush in the toilet yesterday that was clear and she is concerned its fluid. She is 36w

## 2012-04-25 NOTE — MAU Provider Note (Addendum)
Chief Complaint:  Rupture of Membranes   First Provider Initiated Contact with Patient 04/25/12 1250      HPI: Tonya Harmon is a 22 y.o. G3P2 at [redacted]w[redacted]d who presents to maternity admissions reporting wetness and decreased FM since yesterday afternoon. . Denies contractions or vaginal bleeding. Good fetal movement since being here and estimates 6 movements this morning.   Pregnancy Course: FT record not found.  States "hemorrhage" about 6 wks ago and 2x/wk EFM since then due to low fluid, last was 2 days ago and value was 2. Prev C/S.     Past Medical History: Past Medical History  Diagnosis Date  . Bipolar 1 disorder   . Depression   . Anxiety   . Bipolar disorder     Past obstetric history: OB History   Grav Para Term Preterm Abortions TAB SAB Ect Mult Living   3 2        2      # Outc Date GA Lbr Len/2nd Wgt Sex Del Anes PTL Lv   1 PAR      CS      2 PAR      CS      3 CUR               Past Surgical History: Past Surgical History  Procedure Laterality Date  . Cesarean section      Family History: History reviewed. No pertinent family history.  Social History: History  Substance Use Topics  . Smoking status: Never Smoker   . Smokeless tobacco: Never Used  . Alcohol Use: No    Allergies:  Allergies  Allergen Reactions  . Clonazepam Other (See Comments)    Homicidal thoughts  . Flexeril (Cyclobenzaprine Hcl) Other (See Comments)    'really bad muscle spasms'  . Naproxen Nausea And Vomiting and Other (See Comments)    Bad abdominal pain  . Tramadol Itching    Really bad itching  . Cefixime Hives  . Vistaril (Hydroxyzine Hcl) Other (See Comments)    'makes me really edgy'    Meds:  Prescriptions prior to admission  Medication Sig Dispense Refill  . Levonorgestrel-Ethinyl Estradiol (SEASONIQUE) 0.15-0.03 &0.01 MG tablet Take 1 tablet by mouth daily.      . traMADol (ULTRAM) 50 MG tablet Take 50 mg by mouth every 8 (eight) hours as needed. For  pain. Maximum dose= 8 tablets per day      . zolpidem (AMBIEN) 10 MG tablet Take 10 mg by mouth at bedtime.         ROS: Pertinent findings in history of present illness.  Physical Exam  Blood pressure 122/79, pulse 102, temperature 98.2 F (36.8 C), temperature source Oral, resp. rate 18, height 5\' 5"  (1.651 m), weight 82.101 kg (181 lb), last menstrual period 05/04/2011. GENERAL: Well-developed, well-nourished female in no acute distress.  HEENT: normocephalic HEART: normal rate RESP: normal effort ABDOMEN: Soft, non-tender, gravid appropriate for gestational age EXTREMITIES: Nontender, no edema NEURO: alert and oriented SPECULUM EXAM: NEFG, physiologic discharge, no blood, cervix clean Neg pool. Neg fern.  SVE: post, thick FTP to closed     FHT:  Baseline 130 , moderate variability, accelerations present, no decelerations Contractions: none   Assessment: G3P2002 at [redacted]w[redacted]d with decreased fetal movement and without clinically evident SROM Decreased AFV by pt hx Cat 1 FHR   Plan: Discussed reported low AFV w/ Dr. Shawnie Pons [facility]/U at [facility] Discharge home Labor precautions and fetal kick counts   Follow-up Information  Follow up with [facility] In 2 days.   Contact information:   [address] [place] [address] [phone]      Danae Orleans, CNM 04/25/2012 12:53 PM

## 2012-04-27 NOTE — MAU Provider Note (Signed)
Chart reviewed and agree with management and plan.  

## 2012-05-06 ENCOUNTER — Encounter: Payer: Self-pay | Admitting: *Deleted

## 2012-05-06 DIAGNOSIS — F32A Depression, unspecified: Secondary | ICD-10-CM | POA: Insufficient documentation

## 2012-05-06 DIAGNOSIS — F419 Anxiety disorder, unspecified: Secondary | ICD-10-CM

## 2012-05-06 DIAGNOSIS — F329 Major depressive disorder, single episode, unspecified: Secondary | ICD-10-CM

## 2012-05-12 ENCOUNTER — Encounter (HOSPITAL_COMMUNITY): Payer: Self-pay | Admitting: Pharmacist

## 2012-05-15 ENCOUNTER — Encounter (HOSPITAL_COMMUNITY)
Admission: RE | Admit: 2012-05-15 | Discharge: 2012-05-15 | Disposition: A | Discharge status: Home / Self Care | Payer: Medicaid Other | Source: Ambulatory Visit | Attending: Obstetrics and Gynecology | Admitting: Obstetrics and Gynecology

## 2012-05-15 ENCOUNTER — Encounter (HOSPITAL_COMMUNITY): Payer: Self-pay

## 2012-05-15 HISTORY — DX: Nausea with vomiting, unspecified: R11.2

## 2012-05-15 HISTORY — DX: Gastro-esophageal reflux disease without esophagitis: K21.9

## 2012-05-15 HISTORY — DX: Other specified postprocedural states: Z98.890

## 2012-05-15 HISTORY — DX: Headache: R51

## 2012-05-15 LAB — CBC
HCT: 38.8 % (ref 36.0–46.0)
Hemoglobin: 12.8 g/dL (ref 12.0–15.0)
MCH: 30.7 pg (ref 26.0–34.0)
MCHC: 33 g/dL (ref 30.0–36.0)
MCV: 93 fL (ref 78.0–100.0)
Platelets: 246 10*3/uL (ref 150–400)
RBC: 4.17 MIL/uL (ref 3.87–5.11)
RDW: 13.4 % (ref 11.5–15.5)
WBC: 6.6 10*3/uL (ref 4.0–10.5)

## 2012-05-15 LAB — SURGICAL PCR SCREEN
MRSA, PCR: NEGATIVE
Staphylococcus aureus: NEGATIVE

## 2012-05-15 LAB — TYPE AND SCREEN
ABO/RH(D): O POS
Antibody Screen: NEGATIVE

## 2012-05-15 LAB — ABO/RH: ABO/RH(D): O POS

## 2012-05-15 LAB — RPR: RPR Ser Ql: NONREACTIVE

## 2012-05-15 NOTE — Patient Instructions (Addendum)
   Your procedure is scheduled WG:NFAOZH March 17th   Enter through the Main Entrance of [facility] at:6am Pick up the phone at the desk and dial [phone] and inform us of your arrival.  Please call this number if you have any problems the morning of surgery: [phone]  Remember: Do not eat or drink anything after midnight on Sunday   Do not wear jewelry, make-up, or FINGER nail polish No metal in your hair or on your body. Do not wear lotions, powders, perfumes. You may wear deodorant.  Please use your CHG wash as directed prior to surgery.  Do not shave anywhere for at least 12 hours prior to first CHG shower.  Do not bring valuables to the hospital.   Leave suitcase in the car. After Surgery it may be brought to your room. For patients being admitted to the hospital, checkout time is 11:00am the day of discharge.     0

## 2012-05-18 ENCOUNTER — Encounter (HOSPITAL_COMMUNITY): Payer: Self-pay | Admitting: Anesthesiology

## 2012-05-18 ENCOUNTER — Encounter (HOSPITAL_COMMUNITY): Payer: Self-pay | Admitting: *Deleted

## 2012-05-18 ENCOUNTER — Encounter (HOSPITAL_COMMUNITY)
Admission: RE | Disposition: A | Discharge status: Home / Self Care | Payer: Self-pay | Source: Ambulatory Visit | Attending: Obstetrics and Gynecology

## 2012-05-18 ENCOUNTER — Inpatient Hospital Stay (HOSPITAL_COMMUNITY)
Admission: RE | Admit: 2012-05-18 | Discharge: 2012-05-20 | DRG: 766 | Disposition: A | Discharge status: Home / Self Care | Payer: Medicaid Other | Source: Ambulatory Visit | Attending: Obstetrics and Gynecology | Admitting: Obstetrics and Gynecology

## 2012-05-18 ENCOUNTER — Inpatient Hospital Stay (HOSPITAL_COMMUNITY): Payer: Medicaid Other | Admitting: Anesthesiology

## 2012-05-18 DIAGNOSIS — O99344 Other mental disorders complicating childbirth: Secondary | ICD-10-CM | POA: Diagnosis present

## 2012-05-18 DIAGNOSIS — F319 Bipolar disorder, unspecified: Secondary | ICD-10-CM | POA: Diagnosis present

## 2012-05-18 DIAGNOSIS — O34219 Maternal care for unspecified type scar from previous cesarean delivery: Principal | ICD-10-CM | POA: Diagnosis present

## 2012-05-18 SURGERY — Surgical Case
Anesthesia: Spinal | Site: Abdomen | Wound class: Clean Contaminated

## 2012-05-18 MED ORDER — SCOPOLAMINE 1 MG/3DAYS TD PT72: 1.0000 | MEDICATED_PATCH | Freq: Once | TRANSDERMAL | Status: DC

## 2012-05-18 MED ORDER — HYDROMORPHONE HCL PF 1 MG/ML IJ SOLN: 0.2500 mg | INTRAMUSCULAR | Status: DC | PRN

## 2012-05-18 MED ORDER — ONDANSETRON HCL 4 MG/2ML IJ SOLN: 4.0000 mg | Freq: Three times a day (TID) | INTRAMUSCULAR | Status: DC | PRN

## 2012-05-18 MED ORDER — EPHEDRINE SULFATE 50 MG/ML IJ SOLN
INTRAMUSCULAR | Status: DC | PRN
  Administered 2012-05-18 (×10): 10 mg via INTRAVENOUS

## 2012-05-18 MED ORDER — DIBUCAINE 1 % RE OINT: 1.0000 "application " | TOPICAL_OINTMENT | RECTAL | Status: DC | PRN

## 2012-05-18 MED ORDER — SCOPOLAMINE 1 MG/3DAYS TD PT72
MEDICATED_PATCH | TRANSDERMAL | Status: AC
  Administered 2012-05-18: 1.5 mg via TRANSDERMAL
  Filled 2012-05-18: qty 1

## 2012-05-18 MED ORDER — WITCH HAZEL-GLYCERIN EX PADS: 1.0000 "application " | MEDICATED_PAD | CUTANEOUS | Status: DC | PRN

## 2012-05-18 MED ORDER — SIMETHICONE 80 MG PO CHEW
80.0000 mg | CHEWABLE_TABLET | ORAL | Status: DC | PRN
  Administered 2012-05-19: 80 mg via ORAL

## 2012-05-18 MED ORDER — NALBUPHINE HCL 10 MG/ML IJ SOLN
5.0000 mg | INTRAMUSCULAR | Status: DC | PRN
  Filled 2012-05-18: qty 1

## 2012-05-18 MED ORDER — KETOROLAC TROMETHAMINE 30 MG/ML IJ SOLN: 30.0000 mg | Freq: Four times a day (QID) | INTRAMUSCULAR | Status: AC | PRN

## 2012-05-18 MED ORDER — MORPHINE SULFATE (PF) 0.5 MG/ML IJ SOLN
INTRAMUSCULAR | Status: DC | PRN
  Administered 2012-05-18: .15 mg via INTRATHECAL

## 2012-05-18 MED ORDER — OXYTOCIN 10 UNIT/ML IJ SOLN
40.0000 [IU] | INTRAVENOUS | Status: DC | PRN
  Administered 2012-05-18: 40 [IU] via INTRAVENOUS

## 2012-05-18 MED ORDER — NALOXONE HCL 0.4 MG/ML IJ SOLN: 0.4000 mg | INTRAMUSCULAR | Status: DC | PRN

## 2012-05-18 MED ORDER — DIPHENHYDRAMINE HCL 25 MG PO CAPS: 25.0000 mg | ORAL_CAPSULE | Freq: Four times a day (QID) | ORAL | Status: DC | PRN

## 2012-05-18 MED ORDER — EPHEDRINE 5 MG/ML INJ
INTRAVENOUS | Status: AC
  Filled 2012-05-18: qty 20

## 2012-05-18 MED ORDER — OXYTOCIN 40 UNITS IN LACTATED RINGERS INFUSION - SIMPLE MED: 62.5000 mL/h | INTRAVENOUS | Status: AC

## 2012-05-18 MED ORDER — KETOROLAC TROMETHAMINE 60 MG/2ML IM SOLN
INTRAMUSCULAR | Status: AC
  Administered 2012-05-18: 60 mg via INTRAMUSCULAR
  Filled 2012-05-18: qty 2

## 2012-05-18 MED ORDER — DIPHENHYDRAMINE HCL 25 MG PO CAPS
25.0000 mg | ORAL_CAPSULE | ORAL | Status: DC | PRN
  Filled 2012-05-18: qty 1

## 2012-05-18 MED ORDER — PRENATAL MULTIVITAMIN CH
1.0000 | ORAL_TABLET | Freq: Every day | ORAL | Status: DC
  Administered 2012-05-18 – 2012-05-20 (×3): 1 via ORAL
  Filled 2012-05-18 (×2): qty 1

## 2012-05-18 MED ORDER — NALBUPHINE SYRINGE 5 MG/0.5 ML
INJECTION | INTRAMUSCULAR | Status: AC
  Administered 2012-05-18: 10 mg via SUBCUTANEOUS
  Filled 2012-05-18: qty 1

## 2012-05-18 MED ORDER — ONDANSETRON HCL 4 MG/2ML IJ SOLN
INTRAMUSCULAR | Status: AC
  Filled 2012-05-18: qty 2

## 2012-05-18 MED ORDER — OXYCODONE-ACETAMINOPHEN 5-325 MG PO TABS
1.0000 | ORAL_TABLET | Freq: Once | ORAL | Status: AC
  Administered 2012-05-18: 1 via ORAL

## 2012-05-18 MED ORDER — TETANUS-DIPHTH-ACELL PERTUSSIS 5-2.5-18.5 LF-MCG/0.5 IM SUSP
0.5000 mL | Freq: Once | INTRAMUSCULAR | Status: AC
  Administered 2012-05-19: 0.5 mL via INTRAMUSCULAR

## 2012-05-18 MED ORDER — LACTATED RINGERS IV SOLN
INTRAVENOUS | Status: DC
  Administered 2012-05-18: 16:00:00 via INTRAVENOUS

## 2012-05-18 MED ORDER — FENTANYL CITRATE 0.05 MG/ML IJ SOLN
INTRAMUSCULAR | Status: DC | PRN
  Administered 2012-05-18: 25 ug via INTRATHECAL

## 2012-05-18 MED ORDER — LACTATED RINGERS IV SOLN
INTRAVENOUS | Status: DC
  Administered 2012-05-18: 100 mL/h via INTRAVENOUS
  Administered 2012-05-18 (×2): via INTRAVENOUS

## 2012-05-18 MED ORDER — PHENYLEPHRINE 40 MCG/ML (10ML) SYRINGE FOR IV PUSH (FOR BLOOD PRESSURE SUPPORT)
PREFILLED_SYRINGE | INTRAVENOUS | Status: AC
  Filled 2012-05-18: qty 10

## 2012-05-18 MED ORDER — MENTHOL 3 MG MT LOZG: 1.0000 | LOZENGE | OROMUCOSAL | Status: DC | PRN

## 2012-05-18 MED ORDER — MEPERIDINE HCL 25 MG/ML IJ SOLN: 6.2500 mg | INTRAMUSCULAR | Status: DC | PRN

## 2012-05-18 MED ORDER — ONDANSETRON HCL 4 MG PO TABS: 4.0000 mg | ORAL_TABLET | ORAL | Status: DC | PRN

## 2012-05-18 MED ORDER — ONDANSETRON HCL 4 MG/2ML IJ SOLN
INTRAMUSCULAR | Status: DC | PRN
  Administered 2012-05-18: 4 mg via INTRAVENOUS

## 2012-05-18 MED ORDER — LANOLIN HYDROUS EX OINT: 1.0000 "application " | TOPICAL_OINTMENT | CUTANEOUS | Status: DC | PRN

## 2012-05-18 MED ORDER — MORPHINE SULFATE 0.5 MG/ML IJ SOLN
INTRAMUSCULAR | Status: AC
  Filled 2012-05-18: qty 10

## 2012-05-18 MED ORDER — ONDANSETRON HCL 4 MG/2ML IJ SOLN: 4.0000 mg | INTRAMUSCULAR | Status: DC | PRN

## 2012-05-18 MED ORDER — SODIUM CHLORIDE 0.9 % IJ SOLN: 3.0000 mL | INTRAMUSCULAR | Status: DC | PRN

## 2012-05-18 MED ORDER — CEFAZOLIN SODIUM-DEXTROSE 2-3 GM-% IV SOLR
INTRAVENOUS | Status: AC
  Filled 2012-05-18: qty 50

## 2012-05-18 MED ORDER — FENTANYL CITRATE 0.05 MG/ML IJ SOLN
INTRAMUSCULAR | Status: AC
  Filled 2012-05-18: qty 2

## 2012-05-18 MED ORDER — IBUPROFEN 600 MG PO TABS
600.0000 mg | ORAL_TABLET | Freq: Four times a day (QID) | ORAL | Status: DC | PRN
  Administered 2012-05-18 – 2012-05-20 (×8): 600 mg via ORAL
  Filled 2012-05-18 (×7): qty 1

## 2012-05-18 MED ORDER — METOCLOPRAMIDE HCL 5 MG/ML IJ SOLN: 10.0000 mg | Freq: Three times a day (TID) | INTRAMUSCULAR | Status: DC | PRN

## 2012-05-18 MED ORDER — CEFAZOLIN SODIUM-DEXTROSE 2-3 GM-% IV SOLR
2.0000 g | Freq: Once | INTRAVENOUS | Status: AC
  Administered 2012-05-18: 2 g via INTRAVENOUS

## 2012-05-18 MED ORDER — KETOROLAC TROMETHAMINE 60 MG/2ML IM SOLN: 60.0000 mg | Freq: Once | INTRAMUSCULAR | Status: AC | PRN

## 2012-05-18 MED ORDER — OXYTOCIN 10 UNIT/ML IJ SOLN
INTRAMUSCULAR | Status: AC
  Filled 2012-05-18: qty 4

## 2012-05-18 MED ORDER — DIPHENHYDRAMINE HCL 50 MG/ML IJ SOLN: 25.0000 mg | INTRAMUSCULAR | Status: DC | PRN

## 2012-05-18 MED ORDER — OXYCODONE-ACETAMINOPHEN 5-325 MG PO TABS
1.0000 | ORAL_TABLET | ORAL | Status: DC | PRN
  Administered 2012-05-18 – 2012-05-19 (×3): 1 via ORAL
  Administered 2012-05-19 – 2012-05-20 (×3): 2 via ORAL
  Filled 2012-05-18: qty 2
  Filled 2012-05-18 (×3): qty 1
  Filled 2012-05-18: qty 2
  Filled 2012-05-18 (×2): qty 1
  Filled 2012-05-18: qty 2

## 2012-05-18 MED ORDER — SENNOSIDES-DOCUSATE SODIUM 8.6-50 MG PO TABS
2.0000 | ORAL_TABLET | Freq: Every day | ORAL | Status: DC
  Administered 2012-05-18 – 2012-05-19 (×2): 2 via ORAL

## 2012-05-18 MED ORDER — PHENYLEPHRINE HCL 10 MG/ML IJ SOLN
INTRAMUSCULAR | Status: DC | PRN
  Administered 2012-05-18: 80 ug via INTRAVENOUS
  Administered 2012-05-18: 40 ug via INTRAVENOUS
  Administered 2012-05-18 (×3): 80 ug via INTRAVENOUS
  Administered 2012-05-18: 40 ug via INTRAVENOUS

## 2012-05-18 MED ORDER — DEXTROSE 5 % IV SOLN: 1.0000 ug/kg/h | INTRAVENOUS | Status: DC | PRN

## 2012-05-18 MED ORDER — ZOLPIDEM TARTRATE 5 MG PO TABS: 5.0000 mg | ORAL_TABLET | Freq: Every evening | ORAL | Status: DC | PRN

## 2012-05-18 MED ORDER — DIPHENHYDRAMINE HCL 50 MG/ML IJ SOLN: 12.5000 mg | INTRAMUSCULAR | Status: DC | PRN

## 2012-05-18 MED ORDER — SIMETHICONE 80 MG PO CHEW
80.0000 mg | CHEWABLE_TABLET | Freq: Three times a day (TID) | ORAL | Status: DC
  Administered 2012-05-18 – 2012-05-20 (×6): 80 mg via ORAL

## 2012-05-18 SURGICAL SUPPLY — 35 items
APL SKNCLS STERI-STRIP NONHPOA (GAUZE/BANDAGES/DRESSINGS) ×1
BENZOIN TINCTURE PRP APPL 2/3 (GAUZE/BANDAGES/DRESSINGS) ×1 IMPLANT
CLOTH BEACON ORANGE TIMEOUT ST (SAFETY) ×2 IMPLANT
DRAPE LG THREE QUARTER DISP (DRAPES) ×2 IMPLANT
DRSG OPSITE POSTOP 4X10 (GAUZE/BANDAGES/DRESSINGS) ×2 IMPLANT
DURAPREP 26ML APPLICATOR (WOUND CARE) ×2 IMPLANT
ELECT REM PT RETURN 9FT ADLT (ELECTROSURGICAL) ×2
ELECTRODE REM PT RTRN 9FT ADLT (ELECTROSURGICAL) ×1 IMPLANT
EXTRACTOR VACUUM KIWI (MISCELLANEOUS) IMPLANT
GLOVE BIO SURGEON ST LM GN SZ9 (GLOVE) ×2 IMPLANT
GLOVE BIOGEL PI IND STRL 9 (GLOVE) ×1 IMPLANT
GLOVE BIOGEL PI INDICATOR 9 (GLOVE) ×1
GOWN PREVENTION PLUS XLARGE (GOWN DISPOSABLE) ×2 IMPLANT
GOWN STRL REIN 3XL LVL4 (GOWN DISPOSABLE) ×2 IMPLANT
GOWN STRL REIN XL XLG (GOWN DISPOSABLE) ×4 IMPLANT
NDL HYPO 25X5/8 SAFETYGLIDE (NEEDLE) IMPLANT
NEEDLE HYPO 25X5/8 SAFETYGLIDE (NEEDLE) IMPLANT
NS IRRIG 1000ML POUR BTL (IV SOLUTION) ×2 IMPLANT
PACK C SECTION WH (CUSTOM PROCEDURE TRAY) ×2 IMPLANT
PAD OB MATERNITY 4.3X12.25 (PERSONAL CARE ITEMS) ×2 IMPLANT
RETRACTOR WND ALEXIS 25 LRG (MISCELLANEOUS) IMPLANT
RTRCTR C-SECT PINK 25CM LRG (MISCELLANEOUS) ×1 IMPLANT
RTRCTR WOUND ALEXIS 25CM LRG (MISCELLANEOUS)
SLEEVE SCD COMPRESS KNEE MED (MISCELLANEOUS) IMPLANT
STRIP CLOSURE SKIN 1/2X4 (GAUZE/BANDAGES/DRESSINGS) ×1 IMPLANT
SUT CHROMIC 0 CTX 36 (SUTURE) ×4 IMPLANT
SUT VIC AB 0 CT1 27 (SUTURE) ×2
SUT VIC AB 0 CT1 27XBRD ANBCTR (SUTURE) ×1 IMPLANT
SUT VIC AB 2-0 CT1 27 (SUTURE) ×4
SUT VIC AB 2-0 CT1 TAPERPNT 27 (SUTURE) ×2 IMPLANT
SUT VIC AB 4-0 KS 27 (SUTURE) ×2 IMPLANT
SYR BULB IRRIGATION 50ML (SYRINGE) IMPLANT
TOWEL OR 17X24 6PK STRL BLUE (TOWEL DISPOSABLE) ×2 IMPLANT
TRAY FOLEY CATH 14FR (SET/KITS/TRAYS/PACK) ×2 IMPLANT
WATER STERILE IRR 1000ML POUR (IV SOLUTION) ×1 IMPLANT

## 2012-05-18 NOTE — Anesthesia Postprocedure Evaluation (Signed)
  Anesthesia Post-op Note  Patient: Tonya Harmon  Procedure(s) Performed: Procedure(s): CESAREAN SECTION (N/A)  Patient Location: Mother/Baby  Anesthesia Type:Spinal  Level of Consciousness: awake, alert  and oriented  Airway and Oxygen Therapy: Patient Spontanous Breathing  Post-op Pain: none  Post-op Assessment: Post-op Vital signs reviewed and Patient's Cardiovascular Status Stable  Post-op Vital Signs: Reviewed and stable  Complications: No apparent anesthesia complications

## 2012-05-18 NOTE — Brief Op Note (Signed)
See op note

## 2012-05-18 NOTE — Transfer of Care (Signed)
Immediate Anesthesia Transfer of Care Note  Patient: Tonya Harmon  Procedure(s) Performed: Procedure(s): CESAREAN SECTION (N/A)  Patient Location: PACU  Anesthesia Type:Spinal  Level of Consciousness: awake, alert  and oriented  Airway & Oxygen Therapy: Patient Spontanous Breathing  Post-op Assessment: Report given to PACU RN and Post -op Vital signs reviewed and stable  Post vital signs: Reviewed and stable  Complications: No apparent anesthesia complications

## 2012-05-18 NOTE — Op Note (Signed)
10/26/2010  9:29 AM  PATIENT:    PRE-OPERATIVE DIAGNOSIS: Pregnancy 39 weeks repeat cesarean section not for trial of labor  POST-OPERATIVE DIAGNOSIS:  Same delivered  PROCEDURE:  Procedure(s): Repeat cesarean section  SURGEON:Satori Krabill Benancio Deeds, MD  PHYSICIAN ASSISTANTRoanna Raider M.D.  ADDITIONAL ASSISTANTS: none   ANESTHESIA:   spinal  ESTIMATED BLOOD LOSS: 650 cc estimated   BLOOD ADMINISTERED:none  DRAINS: Urinary Catheter (Foley)   LOCAL MEDICATIONS USED:  NONE  SPECIMEN:  Source of Specimen:  Placenta to labor and delivery  DISPOSITION OF SPECIMEN:  Labor and delivery  COUNTS:  YES  TOURNIQUET:  * No tourniquets in log *  DICTATION #: Patient was taken to the operating room prepped and draped in orbital surgery with spinal anesthesia introduced Ancef administered 2 g intravenously and timeout confirmed by surgical team. Transverse incision was repeated with excision of the old cicatrix. There was a tattoo on her left side was carefully trimmed. Then a sterile technique was used and peritoneal cavity easily entered. There were no adhesions of note anteriorly or the bladder flap very. Alexis wound retractor was placed. Transverse uterine incision was made and the baby delivered by rotation the vertex from its ROP position into the incision and fundal pressure applied. Clamped. Cord blood donor status required the placenta delivered intact and passed off to the cord blood donor collections service. Placenta was intact three-vessel cord confirmed. Single layer running locking closure the uterine incision was generally adequate imbricating second layer was used in the middle portion of the incision. Anterior peritoneum was reapproximated with 2-0 Vicryl after irrigation. Fascia was closed with running 0 Vicryl. Subcuticular 4-0 Vicryl was used for skin edge closure. Sponge and needle counts correct EBL 650 cc.  PLAN OF CARE: Inpatient care  PATIENT DISPOSITION:  PACU -  hemodynamically stable.   Delay start of Pharmacological VTE agent (>24hrs) due to surgical blood loss or risk of bleeding:  not applicable

## 2012-05-18 NOTE — Anesthesia Postprocedure Evaluation (Signed)
  Anesthesia Post-op Note  Anesthesia Post Note  Patient: Tonya Harmon  Procedure(s) Performed: Procedure(s) (LRB): CESAREAN SECTION (N/A)  Anesthesia type: Spinal  Patient location: PACU  Post pain: Pain level controlled  Post assessment: Post-op Vital signs reviewed  Last Vitals:  Filed Vitals:   05/18/12 1030  BP:   Pulse: 80  Temp:   Resp: 18    Post vital signs: Reviewed  Level of consciousness: awake  Complications: No apparent anesthesia complications

## 2012-05-18 NOTE — H&P (Signed)
  Tonya Harmon is a 22 y.o. female presenting forrepeat cesarean. Contraception will be Nexplanon.  Marland Kitchen History OB History   Grav Para Term Preterm Abortions TAB SAB Ect Mult Living   3 2 2       2      Past Medical History  Diagnosis Date  . Bipolar 1 disorder   . Depression   . Anxiety   . Bipolar disorder   . PONV (postoperative nausea and vomiting)   . Headache   . GERD (gastroesophageal reflux disease)     pregnancy related-rolaids prn   Past Surgical History  Procedure Laterality Date  . Cesarean section  2011,2008   Family History: family history includes Cancer in her other; Coronary artery disease in her other; Diabetes in her other; and Hypertension in her other. Social History:  reports that she has never smoked. She has never used smokeless tobacco. She reports that she does not drink alcohol or use illicit drugs.   Prenatal Transfer Tool  Maternal Diabetes: No Genetic Screening: Normal Maternal Ultrasounds/Referrals: Normal Fetal Ultrasounds or other Referrals:  None Maternal Substance Abuse:  No Significant Maternal Medications:  None Significant Maternal Lab Results:  Lab values include: Group B Strep negative Other Comments:  None  ROS    Blood pressure 122/67, pulse 83, temperature 98.4 F (36.9 C), temperature source Oral, resp. rate 18, last menstrual period 05/30/2011, SpO2 98.00%. Exam Physical Exam Physical Examination: General appearance - alert, well appearing, and in no distress, oriented to person, place, and time and normal appearing weight Mental status - normal mood, behavior, speech, dress, motor activity, and thought processes Chest - clear to auscultation, no wheezes, rales or rhonchi, symmetric air entry Abdomen - soft, nontender, nondistended, no masses or organomegaly Gravid uterus c/w dates Extremities - peripheral pulses normal, no pedal edema, no clubbing or cyanosis, Homan's sign negative bilaterally  Prenatal  labs: ABO, Rh: --/--/O POS, O POS (03/14 1115) Antibody: NEG (03/14 1115) Rubella: Immune (08/01 0000) RPR: NON REACTIVE (03/14 1115)  HBsAg: Negative (08/01 0000)  HIV: Non-reactive (12/23 0000)  GBS:     Assessment/Plan: Pregnancy  39 weeks 1 day. Repeat cesarean not desiring trial of labor. Risks of procedure reviewed with patient.   Wm Sahagun V 05/18/2012, 8:04 AM

## 2012-05-18 NOTE — Anesthesia Preprocedure Evaluation (Signed)
Anesthesia Evaluation  Patient identified by MRN, date of birth, ID band Patient awake    Reviewed: Allergy & Precautions, H&P , Patient's Chart, lab work & pertinent test results  Airway Mallampati: II TM Distance: >3 FB Neck ROM: full    Dental no notable dental hx.    Pulmonary  breath sounds clear to auscultation  Pulmonary exam normal       Cardiovascular Exercise Tolerance: Good Rhythm:regular Rate:Normal     Neuro/Psych    GI/Hepatic GERD-  Medicated and Controlled,  Endo/Other    Renal/GU      Musculoskeletal   Abdominal   Peds  Hematology   Anesthesia Other Findings   Reproductive/Obstetrics                           Anesthesia Physical Anesthesia Plan  ASA: II  Anesthesia Plan: Spinal   Post-op Pain Management:    Induction:   Airway Management Planned:   Additional Equipment:   Intra-op Plan:   Post-operative Plan:   Informed Consent: I have reviewed the patients History and Physical, chart, labs and discussed the procedure including the risks, benefits and alternatives for the proposed anesthesia with the patient or authorized representative who has indicated his/her understanding and acceptance.   Dental Advisory Given  Plan Discussed with: CRNA  Anesthesia Plan Comments: (Lab work confirmed with CRNA in room. Platelets okay. Discussed spinal anesthetic, and patient consents to the procedure:  included risk of possible headache,backache, failed block, allergic reaction, and nerve injury. This patient was asked if she had any questions or concerns before the procedure started. )        Anesthesia Quick Evaluation

## 2012-05-18 NOTE — OR Nursing (Signed)
Uterus massaged by S. Satterfield RN. Two tubes of cord blood sent to lab.  50cc of blood evacuated from uterus during uterine massage. 

## 2012-05-18 NOTE — Anesthesia Procedure Notes (Signed)
Spinal  Patient location during procedure: OR Preanesthetic Checklist Completed: patient identified, site marked, surgical consent, pre-op evaluation, timeout performed, IV checked, risks and benefits discussed and monitors and equipment checked Spinal Block Patient position: sitting Prep: DuraPrep Patient monitoring: heart rate, cardiac monitor, continuous pulse ox and blood pressure Approach: midline Location: L3-4 Injection technique: single-shot Needle Needle type: Sprotte  Needle gauge: 24 G Needle length: 9 cm Assessment Sensory level: T4 Additional Notes Spinal Dosage in OR  Bupivicaine ml       1.5 [id number]   mcg        150 Fentanyl mcg            25    

## 2012-05-18 NOTE — Consult Note (Signed)
Neonatology Note:   Attendance at C-section:    I was asked by Dr. Emelda Fear to attend this repeat C/S at term. The mother is a G3P2 O pos, GBS neg with bipolar disorder, depression/anxiety and limited PNC. ROM at delivery, fluid clear. Infant with slightly decreased tone but with good spontaneous cry at birth. Needed only minimal bulb suctioning. Ap 7/9. Lungs clear to ausc in DR. To CN to care of Pediatrician.   Doretha Sou, MD

## 2012-05-19 ENCOUNTER — Encounter (HOSPITAL_COMMUNITY): Payer: Self-pay | Admitting: Obstetrics and Gynecology

## 2012-05-19 LAB — CBC
HCT: 34.3 % — ABNORMAL LOW (ref 36.0–46.0)
Hemoglobin: 11.5 g/dL — ABNORMAL LOW (ref 12.0–15.0)
MCH: 31.1 pg (ref 26.0–34.0)
MCHC: 33.5 g/dL (ref 30.0–36.0)
MCV: 92.7 fL (ref 78.0–100.0)
Platelets: 209 10*3/uL (ref 150–400)
RBC: 3.7 MIL/uL — ABNORMAL LOW (ref 3.87–5.11)
RDW: 13.5 % (ref 11.5–15.5)
WBC: 8.3 10*3/uL (ref 4.0–10.5)

## 2012-05-19 LAB — BIRTH TISSUE RECOVERY COLLECTION (PLACENTA DONATION)

## 2012-05-19 NOTE — Progress Notes (Signed)
Subjective: Postpartum Day 1: Cesarean Delivery Patient reports tolerating PO, + flatus and no problems voiding, - BM. Breastfeeding going well.  Nexplanon contraception.  Objective: Vital signs in last 24 hours: Temp:  [97.4 F (36.3 C)-98.3 F (36.8 C)] 97.7 F (36.5 C) (03/18 0443) Pulse Rate:  [55-84] 55 (03/18 0443) Resp:  [18-22] 18 (03/18 0443) BP: (93-122)/(52-82) 103/52 mmHg (03/18 0443) SpO2:  [95 %-100 %] 98 % (03/18 0443) Weight:  [78.472 kg (173 lb)] 78.472 kg (173 lb) (03/17 2232)  Physical Exam:  General: alert, cooperative, appears stated age and no distress Lochia: appropriate Uterine Fundus: firm Incision: no significant drainage DVT Evaluation: No evidence of DVT seen on physical exam.   Recent Labs  05/19/12 0607  HGB 11.5*  HCT 34.3*    Assessment/Plan: Status post Cesarean section. Doing well postoperatively.  Continue current care.  Lorna Dibble, PA-S 05/19/2012, 7:37 AM

## 2012-05-19 NOTE — Clinical Social Work Maternal (Signed)
    Clinical Social Work Department PSYCHOSOCIAL ASSESSMENT - MATERNAL/CHILD 05/19/2012  Patient:  Tonya Harmon, Tonya Harmon  Account Number:  [id number]  Admit Date:  05/18/2012  Marjo Bicker Name:   Unk Pinto Love-McDowell    Clinical Social Worker:  Nobie Putnam, LCSW   Date/Time:  05/19/2012 01:28 PM  Date Referred:  05/19/2012   Referral source  CN     Referred reason  Behavioral Health Issues  Depression/Anxiety   Other referral source:    I:  FAMILY / HOME ENVIRONMENT Child's legal guardian:  PARENT  Guardian - Name Guardian - Age Guardian - Address  Denham Love-McDowell [address] [place], [place] [address]  Leonette Most Love-McDowell 26 (same as above)   Other household support members/support persons Name Relationship DOB  Eli SON 4 years old  Madeira [place] 76 years old   Other support:   Pt's mother    II  PSYCHOSOCIAL DATA Information Source:  Patient Interview  Event organiser Employment:   Surveyor, quantity resources:  [employer] If Medicaid - County:  Teacher, English as a foreign language / Grade:   Maternity Care Coordinator / Child Services Coordination / Early Interventions:  Cultural issues impacting care:    III  STRENGTHS Strengths  Adequate Resources  Home prepared for Child (including basic supplies)  Supportive family/friends   Strength comment:    IV  RISK FACTORS AND CURRENT PROBLEMS Current Problem:  YES   Risk Factor & Current Problem Patient Issue Family Issue Risk Factor / Current Problem Comment  Mental Illness Y N Hx of bipolar disorder, depression/anxiety & SI    V  SOCIAL WORK ASSESSMENT CSW met with pt to assess history of mental illnesses & SI. Pt was diagnosed with Bipolar disorder in 03/2010 by Dr. Lolly Mustache during a one week admission at [facility].  She was admitted for depression & SI at that time.  Once she was discharged, she was prescribed several different medications & participated in counseling with Peggy Bynum 2x's a  week. Pt told CSW that she was taking her medications regularly for a while but once she stopped, she was readmitted to [facility] 11/2010.  Pt explained that her spouse was working 16 hour days at that time & she felt overwhelmed caring for 2 small children.  She denies any HI.  Once she was discharged from [facility] the 2nd time, she took her medication regularly until the couple decided to have a baby.  She has not taken medication since 04/2011 & reports feeling fine.  She denies any recent SI or depression.  CSW discussed PP depression sign/symptoms & encouraged her to seek medical attention if necessary.  Pt agrees.  She has all the necessary supplies for the infant & good family support.  CSW available to assist further if needed.      VI SOCIAL WORK PLAN Social Work Plan  No Further Intervention Required / No Barriers to Discharge   Type of pt/family education:   If child protective services report - county:   If child protective services report - date:   Information/referral to community resources comment:   PP depression literature   Other social work plan:

## 2012-05-19 NOTE — Progress Notes (Signed)
UR chart review completed.  

## 2012-05-20 MED ORDER — IBUPROFEN 600 MG PO TABS: 600.0000 mg | ORAL_TABLET | Freq: Four times a day (QID) | ORAL | Status: DC | PRN

## 2012-05-20 MED ORDER — OXYCODONE-ACETAMINOPHEN 5-325 MG PO TABS: 1.0000 | ORAL_TABLET | ORAL | Status: DC | PRN

## 2012-05-20 NOTE — Progress Notes (Signed)
I saw and examined patient and agree with above student note. Napoleon Form, MD

## 2012-05-20 NOTE — Discharge Summary (Signed)
Attestation of Attending Supervision of Advanced Practitioner (PA/CNM/NP): Evaluation and management procedures were performed by the Advanced Practitioner under my supervision and collaboration.  I have reviewed the Advanced Practitioner's note and chart, and I agree with the management and plan.  Briza Bark, MD, FACOG Attending Obstetrician & Gynecologist Faculty Practice, [facility]   

## 2012-05-20 NOTE — Discharge Summary (Signed)
Obstetric Discharge Summary Reason for Admission: cesarean section Prenatal Procedures: none Intrapartum Procedures: cesarean: low cervical, transverse Postpartum Procedures: none Complications-Operative and Postpartum: none Hemoglobin  Date Value Range Status  05/19/2012 11.5* 12.0 - 15.0 g/dL Final     HCT  Date Value Range Status  05/19/2012 34.3* 36.0 - 46.0 % Final    Physical Exam:  General: alert, cooperative and no distress Lochia: appropriate Uterine Fundus: firm Incision: no significant drainage, no dehiscence, no significant erythema DVT Evaluation: No evidence of DVT seen on physical exam. Negative Homan's sign. No cords or calf tenderness.  Discharge Diagnoses: Term Pregnancy-delivered and RLTCS  Discharge Information: Date: 05/20/2012 Activity: unrestricted Diet: routine Medications: PNV, Ibuprofen, Colace and Percocet Condition: stable Instructions: refer to practice specific booklet Discharge to: home Contraception: nexplanon, Depo in 1 week Breastfeeding   Newborn Data: Live born female  Birth Weight: 8 lb 11.7 oz (3960 g) APGAR: 7, 9  Home with mother.  Kevin Fenton 05/20/2012, 7:55 AM  I have seen and examined this patient and I agree with the above. Cam Hai 9:21 AM 05/20/2012

## 2012-05-25 ENCOUNTER — Ambulatory Visit (INDEPENDENT_AMBULATORY_CARE_PROVIDER_SITE_OTHER): Payer: Medicaid Other | Admitting: Obstetrics and Gynecology

## 2012-05-25 VITALS — BP 108/62 | Ht 65.0 in | Wt 168.2 lb

## 2012-05-25 DIAGNOSIS — O34219 Maternal care for unspecified type scar from previous cesarean delivery: Secondary | ICD-10-CM | POA: Insufficient documentation

## 2012-05-25 DIAGNOSIS — Z309 Encounter for contraceptive management, unspecified: Secondary | ICD-10-CM | POA: Insufficient documentation

## 2012-05-25 NOTE — Progress Notes (Signed)
Subjective:     Katheen Aslin is a 22 y.o. female who presents to the clinic 1 weeks status post CESAREAN . Eating a regular diet without difficulty. Bowel movements are normal. Pain is controlled with current analgesics. Medications being used: narcotic analgesics including PERCOCET AND MOTRIN.  The following portions of the patient's history were reviewed and updated as appropriate: past family history, past medical history, past social history and problem list.  Review of Systems Pertinent items are noted in HPI.    Objective:    BP 108/62  Ht 5\' 5"  (1.651 m)  Wt 168 lb 3.2 oz (76.295 kg)  BMI 27.99 kg/m2  LMP 05/30/2011  Breastfeeding? Yes General:  alert, cooperative and no distress  Abdomen: soft, bowel sounds active, non-tender  Incision:   no erythema, no hernia, no seroma, no swelling, no dehiscence, incision well approximated     Assessment:    Doing well postoperatively. Operative findings again reviewed. Pathology report discussed.   , IMPLANON INSERTION AND FINAL PP CHECK Plan:    1. Continue any current medications. 2. Wound care discussed. 3. Activity restrictions: LIFTING BABY FINE 4. Anticipated return to work: 4 weeks. 5. Follow up: 4 week for suture removal.

## 2012-05-25 NOTE — Patient Instructions (Addendum)
CONTINUE CURRENT CARE

## 2012-05-27 ENCOUNTER — Encounter: Payer: Self-pay | Admitting: Obstetrics and Gynecology

## 2012-06-02 ENCOUNTER — Other Ambulatory Visit: Payer: Self-pay | Admitting: Obstetrics & Gynecology

## 2012-06-08 ENCOUNTER — Ambulatory Visit (INDEPENDENT_AMBULATORY_CARE_PROVIDER_SITE_OTHER): Payer: Medicaid Other | Admitting: Obstetrics & Gynecology

## 2012-06-08 ENCOUNTER — Encounter: Payer: Self-pay | Admitting: Obstetrics & Gynecology

## 2012-06-08 VITALS — BP 110/60 | Ht 65.0 in | Wt 165.0 lb

## 2012-06-08 DIAGNOSIS — Z3202 Encounter for pregnancy test, result negative: Secondary | ICD-10-CM

## 2012-06-08 DIAGNOSIS — Z309 Encounter for contraceptive management, unspecified: Secondary | ICD-10-CM

## 2012-06-08 LAB — POCT URINE PREGNANCY: Preg Test, Ur: NEGATIVE

## 2012-06-08 MED ORDER — MEDROXYPROGESTERONE ACETATE 150 MG/ML IM SUSP
150.0000 mg | Freq: Once | INTRAMUSCULAR | Status: AC
  Administered 2012-06-08: 150 mg via INTRAMUSCULAR

## 2012-06-22 ENCOUNTER — Encounter: Payer: Medicaid Other | Admitting: Obstetrics and Gynecology

## 2012-07-02 ENCOUNTER — Encounter: Payer: Medicaid Other | Admitting: Obstetrics and Gynecology

## 2012-07-03 ENCOUNTER — Encounter: Payer: Medicaid Other | Admitting: Obstetrics and Gynecology

## 2012-07-06 ENCOUNTER — Encounter: Payer: Medicaid Other | Admitting: Obstetrics and Gynecology

## 2012-07-20 ENCOUNTER — Encounter: Payer: Self-pay | Admitting: Obstetrics and Gynecology

## 2012-07-20 ENCOUNTER — Ambulatory Visit (INDEPENDENT_AMBULATORY_CARE_PROVIDER_SITE_OTHER): Payer: Medicaid Other | Admitting: Obstetrics and Gynecology

## 2012-07-20 VITALS — BP 118/68 | Ht 65.0 in | Wt 145.8 lb

## 2012-07-20 DIAGNOSIS — Z3202 Encounter for pregnancy test, result negative: Secondary | ICD-10-CM

## 2012-07-20 DIAGNOSIS — Z30017 Encounter for initial prescription of implantable subdermal contraceptive: Secondary | ICD-10-CM

## 2012-07-20 DIAGNOSIS — Z3200 Encounter for pregnancy test, result unknown: Secondary | ICD-10-CM

## 2012-07-20 DIAGNOSIS — Z309 Encounter for contraceptive management, unspecified: Secondary | ICD-10-CM

## 2012-07-20 LAB — POCT URINE PREGNANCY: Preg Test, Ur: NEGATIVE

## 2012-07-20 NOTE — Patient Instructions (Addendum)

## 2012-07-20 NOTE — Progress Notes (Signed)
GYNECOLOGY CLINIC PROCEDURE NOTE  Tonya Harmon is a 22 y.o. [phone] here for Nexplanon removal/ Nexplanon insertion. No GYN concerns.  Last pap smear was  normal  Nexplanon Insertion Procedure Patient given informed consent, she signed consent form.  Patient does understand that irregular bleeding is a very common side effect of this medication. Pregnancy test was negative.  Appropriate time out taken.  Patient's left arm was prepped and draped in the usual sterile fashion.. The ruler used to measure and mark insertion area.  Patient was prepped with alcohol swab and then injected with 3 ml of 1% lidocaine.  She was prepped with betadine, Nexplanon removed from packaging,  Device confirmed in needle, then inserted full length of needle and withdrawn per handbook instructions. Nexplanon was able to palpated in the patient's arm; patient palpated the insert herself. There was minimal blood loss.  Patient insertion site covered with guaze and a pressure bandage to reduce any bruising.  The patient tolerated the procedure well and was given post procedure instructions. Return prn for Nexplanon check.

## 2012-09-21 ENCOUNTER — Other Ambulatory Visit: Payer: Self-pay | Admitting: *Deleted

## 2012-09-21 MED ORDER — ZOLPIDEM TARTRATE 5 MG PO TABS: 5.0000 mg | ORAL_TABLET | Freq: Every evening | ORAL | Status: DC | PRN

## 2012-10-21 ENCOUNTER — Other Ambulatory Visit: Payer: Self-pay | Admitting: *Deleted

## 2012-10-22 MED ORDER — ZOLPIDEM TARTRATE 5 MG PO TABS: 5.0000 mg | ORAL_TABLET | Freq: Every evening | ORAL | Status: DC | PRN

## 2012-12-30 ENCOUNTER — Other Ambulatory Visit (HOSPITAL_COMMUNITY): Payer: Self-pay | Admitting: Family Medicine

## 2012-12-30 DIAGNOSIS — R109 Unspecified abdominal pain: Secondary | ICD-10-CM

## 2012-12-30 DIAGNOSIS — R1011 Right upper quadrant pain: Secondary | ICD-10-CM

## 2012-12-31 ENCOUNTER — Ambulatory Visit (HOSPITAL_COMMUNITY)
Admission: RE | Admit: 2012-12-31 | Discharge: 2012-12-31 | Disposition: A | Discharge status: Home / Self Care | Payer: Medicaid Other | Source: Ambulatory Visit | Attending: Family Medicine | Admitting: Family Medicine

## 2012-12-31 DIAGNOSIS — R1011 Right upper quadrant pain: Secondary | ICD-10-CM | POA: Insufficient documentation

## 2012-12-31 DIAGNOSIS — K828 Other specified diseases of gallbladder: Secondary | ICD-10-CM | POA: Insufficient documentation

## 2012-12-31 DIAGNOSIS — R109 Unspecified abdominal pain: Secondary | ICD-10-CM

## 2012-12-31 DIAGNOSIS — K219 Gastro-esophageal reflux disease without esophagitis: Secondary | ICD-10-CM | POA: Insufficient documentation

## 2013-01-12 ENCOUNTER — Other Ambulatory Visit (HOSPITAL_COMMUNITY): Payer: Self-pay | Admitting: Family Medicine

## 2013-01-12 DIAGNOSIS — R1031 Right lower quadrant pain: Secondary | ICD-10-CM

## 2013-01-12 DIAGNOSIS — R110 Nausea: Secondary | ICD-10-CM

## 2013-01-13 ENCOUNTER — Encounter (HOSPITAL_COMMUNITY): Payer: Self-pay

## 2013-01-13 ENCOUNTER — Encounter (HOSPITAL_COMMUNITY)
Admission: RE | Admit: 2013-01-13 | Discharge: 2013-01-13 | Disposition: A | Discharge status: Home / Self Care | Payer: Medicaid Other | Source: Ambulatory Visit | Attending: Family Medicine | Admitting: Family Medicine

## 2013-01-13 DIAGNOSIS — R110 Nausea: Secondary | ICD-10-CM | POA: Insufficient documentation

## 2013-01-13 DIAGNOSIS — R1031 Right lower quadrant pain: Secondary | ICD-10-CM | POA: Insufficient documentation

## 2013-01-13 MED ORDER — SINCALIDE 5 MCG IJ SOLR
INTRAMUSCULAR | Status: AC
  Administered 2013-01-13: 1.05 ug
  Filled 2013-01-13: qty 5

## 2013-01-13 MED ORDER — TECHNETIUM TC 99M MEBROFENIN IV KIT
5.0000 | PACK | Freq: Once | INTRAVENOUS | Status: AC | PRN
  Administered 2013-01-13: 5 via INTRAVENOUS

## 2013-01-13 MED ORDER — STERILE WATER FOR INJECTION IJ SOLN
INTRAMUSCULAR | Status: AC
  Filled 2013-01-13: qty 10

## 2013-02-03 ENCOUNTER — Other Ambulatory Visit (HOSPITAL_COMMUNITY): Payer: Self-pay | Admitting: General Surgery

## 2013-02-03 DIAGNOSIS — R52 Pain, unspecified: Secondary | ICD-10-CM

## 2013-02-08 ENCOUNTER — Ambulatory Visit (HOSPITAL_COMMUNITY): Payer: Medicaid Other

## 2013-02-08 ENCOUNTER — Ambulatory Visit (HOSPITAL_COMMUNITY)
Admission: RE | Admit: 2013-02-08 | Discharge: 2013-02-08 | Disposition: A | Discharge status: Home / Self Care | Payer: Medicaid Other | Source: Ambulatory Visit | Attending: General Surgery | Admitting: General Surgery

## 2013-02-08 DIAGNOSIS — N854 Malposition of uterus: Secondary | ICD-10-CM | POA: Insufficient documentation

## 2013-02-08 DIAGNOSIS — R109 Unspecified abdominal pain: Secondary | ICD-10-CM | POA: Insufficient documentation

## 2013-02-08 DIAGNOSIS — R52 Pain, unspecified: Secondary | ICD-10-CM

## 2013-09-03 ENCOUNTER — Encounter (HOSPITAL_COMMUNITY): Payer: Self-pay | Admitting: Emergency Medicine

## 2013-09-03 ENCOUNTER — Emergency Department (HOSPITAL_COMMUNITY)
Admission: EM | Admit: 2013-09-03 | Discharge: 2013-09-03 | Disposition: A | Discharge status: Home / Self Care | Payer: Medicaid Other | Attending: Emergency Medicine | Admitting: Emergency Medicine

## 2013-09-03 DIAGNOSIS — F411 Generalized anxiety disorder: Secondary | ICD-10-CM | POA: Diagnosis not present

## 2013-09-03 DIAGNOSIS — D684 Acquired coagulation factor deficiency: Secondary | ICD-10-CM | POA: Diagnosis not present

## 2013-09-03 DIAGNOSIS — R2232 Localized swelling, mass and lump, left upper limb: Secondary | ICD-10-CM

## 2013-09-03 DIAGNOSIS — Z79899 Other long term (current) drug therapy: Secondary | ICD-10-CM | POA: Diagnosis not present

## 2013-09-03 DIAGNOSIS — R229 Localized swelling, mass and lump, unspecified: Secondary | ICD-10-CM | POA: Diagnosis not present

## 2013-09-03 DIAGNOSIS — Z8719 Personal history of other diseases of the digestive system: Secondary | ICD-10-CM | POA: Insufficient documentation

## 2013-09-03 DIAGNOSIS — M7989 Other specified soft tissue disorders: Secondary | ICD-10-CM | POA: Diagnosis present

## 2013-09-03 MED ORDER — OXYCODONE-ACETAMINOPHEN 5-325 MG PO TABS
2.0000 | ORAL_TABLET | Freq: Once | ORAL | Status: AC
  Administered 2013-09-03: 2 via ORAL
  Filled 2013-09-03: qty 2

## 2013-09-03 MED ORDER — HYDROCODONE-ACETAMINOPHEN 5-325 MG PO TABS: 1.0000 | ORAL_TABLET | Freq: Once | ORAL | Status: DC

## 2013-09-03 MED ORDER — OXYCODONE-ACETAMINOPHEN 5-325 MG PO TABS: 1.0000 | ORAL_TABLET | ORAL | Status: DC | PRN

## 2013-09-03 MED ORDER — DOXYCYCLINE HYCLATE 100 MG PO CAPS: 100.0000 mg | ORAL_CAPSULE | Freq: Two times a day (BID) | ORAL | Status: DC

## 2013-09-03 MED ORDER — DOXYCYCLINE HYCLATE 100 MG PO TABS
100.0000 mg | ORAL_TABLET | Freq: Once | ORAL | Status: AC
  Administered 2013-09-03: 100 mg via ORAL
  Filled 2013-09-03: qty 1

## 2013-09-03 NOTE — ED Provider Notes (Signed)
CSN: [id number]     Arrival date & time 09/03/13  1036 History   First MD Initiated Contact with Patient 09/03/13 1131     Chief Complaint  Patient presents with  . Insect Bite     (Consider location/radiation/quality/duration/timing/severity/associated sxs/prior Treatment) Patient is a 23 y.o. female presenting with abscess. The history is provided by the patient.  Abscess Location:  Shoulder/arm Shoulder/arm abscess location:  L axilla Abscess quality: induration and painful   Abscess quality: not draining, no fluctuance, no itching, no redness, no warmth and not weeping   Red streaking: no   Duration:  2 days Progression:  Unchanged Pain details:    Quality:  Throbbing and sharp   Severity:  Moderate   Timing:  Constant   Progression:  Unchanged Chronicity:  New Context: insect bite/sting   Context: not diabetes and not immunosuppression   Relieved by:  Nothing Worsened by:  Nothing tried Ineffective treatments:  None tried Associated symptoms: no fever, no headaches, no nausea and no vomiting   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  She thinks she may have been bitten by an insect and reports a red bump to the same area that has since resolved.  She also reports fever with "shaking and jerking" one day prior to arrival that she states was a seizure, and reports occassional hx of seizures although denies current medications for seizures..  Pain to the axilla is worse with left arm movement and improves when arm is at rest.  She denies vomiting, redness or drainage.     Past Medical History  Diagnosis Date  . Bipolar 1 disorder   . Depression   . Anxiety   . Bipolar disorder   . PONV (postoperative nausea and vomiting)   . Headache(784.0)   . GERD (gastroesophageal reflux disease)     pregnancy related-rolaids prn   Past Surgical History  Procedure Laterality Date  . Cesarean section  2011,2008  . Cesarean  section N/A 05/18/2012    Procedure: CESAREAN SECTION;  Surgeon: Tilda BurrowJohn V Ferguson, MD;  Location: [facility];  Service: Obstetrics;  Laterality: N/A;   Family History  Problem Relation Age of Onset  . Diabetes Other   . Hypertension Other   . Cancer Other   . Coronary artery disease Other    History  Substance Use Topics  . Smoking status: Never Smoker   . Smokeless tobacco: Never Used  . Alcohol Use: No   OB History   Grav Para Term Preterm Abortions TAB SAB Ect Mult Living   3 3 3       3      Review of Systems  Constitutional: Negative for fever and chills.  Respiratory: Negative for chest tightness and shortness of breath.   Gastrointestinal: Negative for nausea and vomiting.  Musculoskeletal: Negative for arthralgias and joint swelling.  Skin: Positive for color change.       Abscess   Neurological: Negative for dizziness, syncope, facial asymmetry, speech difficulty, weakness and headaches.  Hematological: Negative for adenopathy.  All other systems reviewed and are negative.     Allergies  Clonazepam; Tramadol; Flexeril; Naproxen; Prednisone; Cefixime; and Vistaril  Home Medications   Prior to Admission medications   Medication Sig Start Date End Date Taking? Authorizing Provider  ALPRAZolam Prudy Feeler(XANAX) 1 MG tablet Take 1 mg by mouth 2 (two) times daily.   Yes Historical Provider, MD  DULoxetine (CYMBALTA) 30 MG capsule  Take 30 mg by mouth daily.   Yes Historical Provider, MD  etonogestrel (NEXPLANON) 68 MG IMPL implant Inject 1 each into the skin once.   Yes Historical Provider, MD  HYDROcodone-acetaminophen (NORCO/VICODIN) 5-325 MG per tablet Take 1 tablet by mouth 2 (two) times daily.   Yes Historical Provider, MD  ibuprofen (ADVIL,MOTRIN) 800 MG tablet Take 800 mg by mouth every 8 (eight) hours as needed for moderate pain.   Yes Historical Provider, MD  SUMAtriptan (IMITREX) 25 MG tablet Take 25 mg by mouth every 2 (two) hours as needed for migraine or headache. May  repeat in 2 hours if headache persists or recurs.   Yes Historical Provider, MD   BP 100/59  Pulse 126  Temp(Src) 97.4 F (36.3 C) (Oral)  Resp 18  Ht 5' 5.5" (1.664 m)  Wt 117 lb (53.071 kg)  BMI 19.17 kg/m2  SpO2 100% Physical Exam  Nursing note and vitals reviewed. Constitutional: She is oriented to person, place, and time. She appears well-developed and well-nourished. No distress.  HENT:  Head: Normocephalic and atraumatic.  Cardiovascular: Normal rate, regular rhythm and normal heart sounds.   No murmur heard. Pulmonary/Chest: Effort normal and breath sounds normal. No respiratory distress.  Neurological: She is alert and oriented to person, place, and time. She exhibits normal muscle tone. Coordination normal.  Skin: Skin is warm and dry. No rash noted. No erythema.  Localized palpable mass to the left axilla.  No erythema, fluctuance, lymphangitis or drainage    ED Course  Procedures (including critical care time) Labs Review Labs Reviewed - No data to display  Imaging Review No results found.   EKG Interpretation None      MDM   Final diagnoses:  Mass of axilla, left    Palpable mass to the left axilla  No erythema, fluctuance or drainage.  Pt is well appearing, alert, no focal neuro deficits.    Axilla examined with US, no obvious abscess seen at this time.  Pt agrees to warm compresses, doxy, and percocet for pain.  Pt also agrees to recheck on Monday or sooner if needed.   Pt also seen by Dr. Bebe ShaggyWickline and care plan discussed. Pt is non-toxic appearing and stable for discharge.     Alexanderia Gorby L. Sarha Bartelt, PA-C 09/05/13 1856  Darrian Grzelak L. Trisha Mangleriplett, PA-C 09/05/13 1858

## 2013-09-03 NOTE — ED Notes (Signed)
PA at bedside and reported to re-assess pt v/s after pt pain has decreased. PA give pt PO fluids to drink as well.

## 2013-09-03 NOTE — ED Notes (Signed)
Spider bite under left arm pit 2 days ago.  Yesterday had a fever and states she had a seizure.

## 2013-09-03 NOTE — ED Provider Notes (Signed)
No abscess noted by bedside ultrasound (images NOT archived) Though given history, recommend starting oral antibiotics Pt stable for d/c home Reports she has h/o seizure in past and will f/u as outpatient with her PCP  Joya Gaskinsonald W Kosei Rhodes, MD 09/03/13 [phone]

## 2013-09-06 NOTE — ED Provider Notes (Signed)
Medical screening examination/treatment/procedure(s) were conducted as a shared visit with non-physician practitioner(s) and myself.  I personally evaluated the patient during the encounter.   EKG Interpretation None       Pt well appearing, no distress stable for d/c home  Joya Gaskinsonald W Wave Calzada, MD 09/06/13 [phone]

## 2013-09-07 ENCOUNTER — Emergency Department (HOSPITAL_COMMUNITY): Payer: Medicaid Other

## 2013-09-07 ENCOUNTER — Inpatient Hospital Stay (HOSPITAL_COMMUNITY)
Admission: EM | Admit: 2013-09-07 | Discharge: 2013-09-12 | DRG: 603 | Disposition: A | Discharge status: Home / Self Care | Payer: Medicaid Other | Attending: Internal Medicine | Admitting: Internal Medicine

## 2013-09-07 ENCOUNTER — Encounter (HOSPITAL_COMMUNITY): Payer: Self-pay | Admitting: Emergency Medicine

## 2013-09-07 DIAGNOSIS — L03112 Cellulitis of left axilla: Secondary | ICD-10-CM | POA: Diagnosis present

## 2013-09-07 DIAGNOSIS — K219 Gastro-esophageal reflux disease without esophagitis: Secondary | ICD-10-CM | POA: Diagnosis present

## 2013-09-07 DIAGNOSIS — F419 Anxiety disorder, unspecified: Secondary | ICD-10-CM | POA: Diagnosis present

## 2013-09-07 DIAGNOSIS — Z8249 Family history of ischemic heart disease and other diseases of the circulatory system: Secondary | ICD-10-CM | POA: Diagnosis not present

## 2013-09-07 DIAGNOSIS — IMO0002 Reserved for concepts with insufficient information to code with codable children: Principal | ICD-10-CM

## 2013-09-07 DIAGNOSIS — F411 Generalized anxiety disorder: Secondary | ICD-10-CM | POA: Diagnosis present

## 2013-09-07 DIAGNOSIS — N61 Mastitis without abscess: Secondary | ICD-10-CM | POA: Diagnosis present

## 2013-09-07 DIAGNOSIS — F329 Major depressive disorder, single episode, unspecified: Secondary | ICD-10-CM

## 2013-09-07 DIAGNOSIS — Z833 Family history of diabetes mellitus: Secondary | ICD-10-CM | POA: Diagnosis not present

## 2013-09-07 DIAGNOSIS — R2230 Localized swelling, mass and lump, unspecified upper limb: Secondary | ICD-10-CM | POA: Diagnosis present

## 2013-09-07 DIAGNOSIS — F3130 Bipolar disorder, current episode depressed, mild or moderate severity, unspecified: Secondary | ICD-10-CM | POA: Diagnosis present

## 2013-09-07 DIAGNOSIS — R229 Localized swelling, mass and lump, unspecified: Secondary | ICD-10-CM

## 2013-09-07 DIAGNOSIS — F32A Depression, unspecified: Secondary | ICD-10-CM | POA: Diagnosis present

## 2013-09-07 DIAGNOSIS — R2232 Localized swelling, mass and lump, left upper limb: Secondary | ICD-10-CM

## 2013-09-07 DIAGNOSIS — F3289 Other specified depressive episodes: Secondary | ICD-10-CM

## 2013-09-07 LAB — COMPREHENSIVE METABOLIC PANEL
ALT: 15 U/L (ref 0–35)
AST: 14 U/L (ref 0–37)
Albumin: 3.3 g/dL — ABNORMAL LOW (ref 3.5–5.2)
Alkaline Phosphatase: 137 U/L — ABNORMAL HIGH (ref 39–117)
Anion gap: 12 (ref 5–15)
BUN: 13 mg/dL (ref 6–23)
CO2: 28 mEq/L (ref 19–32)
Calcium: 9.4 mg/dL (ref 8.4–10.5)
Chloride: 105 mEq/L (ref 96–112)
Creatinine, Ser: 0.51 mg/dL (ref 0.50–1.10)
GFR calc Af Amer: >90 mL/min (ref >90)
GFR calc non Af Amer: >90 mL/min (ref >90)
Glucose, Bld: 98 mg/dL (ref 70–99)
Potassium: 3.8 mEq/L (ref 3.7–5.3)
Sodium: 145 mEq/L (ref 137–147)
Total Bilirubin: 0.4 mg/dL (ref 0.3–1.2)
Total Protein: 7.2 g/dL (ref 6.0–8.3)

## 2013-09-07 LAB — URINALYSIS, ROUTINE W REFLEX MICROSCOPIC
Bilirubin Urine: NEGATIVE
Glucose, UA: NEGATIVE mg/dL
Hgb urine dipstick: NEGATIVE
Ketones, ur: NEGATIVE mg/dL
Leukocytes, UA: NEGATIVE
Nitrite: NEGATIVE
Protein, ur: NEGATIVE mg/dL
Specific Gravity, Urine: 1.01 (ref 1.005–1.030)
Urobilinogen, UA: 0.2 mg/dL (ref 0.0–1.0)
pH: 5.5 (ref 5.0–8.0)

## 2013-09-07 LAB — CBC WITH DIFFERENTIAL/PLATELET
Basophils Absolute: 0 10*3/uL (ref 0.0–0.1)
Basophils Relative: 0 % (ref 0–1)
Eosinophils Absolute: 0 10*3/uL (ref 0.0–0.7)
Eosinophils Relative: 0 % (ref 0–5)
HCT: 30.8 % — ABNORMAL LOW (ref 36.0–46.0)
Hemoglobin: 10.3 g/dL — ABNORMAL LOW (ref 12.0–15.0)
Lymphocytes Relative: 11 % — ABNORMAL LOW (ref 12–46)
Lymphs Abs: 1 10*3/uL (ref 0.7–4.0)
MCH: 29.8 pg (ref 26.0–34.0)
MCHC: 33.4 g/dL (ref 30.0–36.0)
MCV: 89 fL (ref 78.0–100.0)
Monocytes Absolute: 0.6 10*3/uL (ref 0.1–1.0)
Monocytes Relative: 7 % (ref 3–12)
Neutro Abs: 7.9 10*3/uL — ABNORMAL HIGH (ref 1.7–7.7)
Neutrophils Relative %: 82 % — ABNORMAL HIGH (ref 43–77)
Platelets: 250 10*3/uL (ref 150–400)
RBC: 3.46 MIL/uL — ABNORMAL LOW (ref 3.87–5.11)
RDW: 14.4 % (ref 11.5–15.5)
WBC: 9.6 10*3/uL (ref 4.0–10.5)

## 2013-09-07 LAB — PREGNANCY, URINE: Preg Test, Ur: NEGATIVE

## 2013-09-07 MED ORDER — SODIUM CHLORIDE 0.9 % IV SOLN: INTRAVENOUS | Status: DC

## 2013-09-07 MED ORDER — HYDROMORPHONE HCL PF 1 MG/ML IJ SOLN
1.0000 mg | INTRAMUSCULAR | Status: AC | PRN
  Administered 2013-09-07 – 2013-09-08 (×3): 1 mg via INTRAVENOUS
  Filled 2013-09-07 (×4): qty 1

## 2013-09-07 MED ORDER — HYDROMORPHONE HCL PF 1 MG/ML IJ SOLN
1.0000 mg | Freq: Once | INTRAMUSCULAR | Status: AC
  Administered 2013-09-07: 1 mg via INTRAVENOUS
  Filled 2013-09-07: qty 1

## 2013-09-07 MED ORDER — ONDANSETRON HCL 4 MG/2ML IJ SOLN
4.0000 mg | Freq: Once | INTRAMUSCULAR | Status: AC
  Administered 2013-09-07: 4 mg via INTRAVENOUS
  Filled 2013-09-07: qty 2

## 2013-09-07 MED ORDER — ONDANSETRON HCL 4 MG/2ML IJ SOLN
4.0000 mg | Freq: Three times a day (TID) | INTRAMUSCULAR | Status: AC | PRN
  Administered 2013-09-07: 4 mg via INTRAVENOUS
  Filled 2013-09-07: qty 2

## 2013-09-07 MED ORDER — TEMAZEPAM 15 MG PO CAPS
30.0000 mg | ORAL_CAPSULE | Freq: Every day | ORAL | Status: DC
  Administered 2013-09-07 – 2013-09-09 (×3): 30 mg via ORAL
  Filled 2013-09-07 (×3): qty 2

## 2013-09-07 MED ORDER — IBUPROFEN 800 MG PO TABS
800.0000 mg | ORAL_TABLET | Freq: Three times a day (TID) | ORAL | Status: DC | PRN
  Administered 2013-09-07 – 2013-09-09 (×3): 800 mg via ORAL
  Filled 2013-09-07 (×3): qty 1

## 2013-09-07 MED ORDER — SODIUM CHLORIDE 0.9 % IV SOLN
INTRAVENOUS | Status: DC
  Administered 2013-09-07 – 2013-09-09 (×3): via INTRAVENOUS

## 2013-09-07 MED ORDER — IOHEXOL 300 MG/ML  SOLN
80.0000 mL | Freq: Once | INTRAMUSCULAR | Status: AC | PRN
  Administered 2013-09-07: 80 mL via INTRAVENOUS

## 2013-09-07 MED ORDER — HEPARIN SODIUM (PORCINE) 5000 UNIT/ML IJ SOLN
5000.0000 [IU] | Freq: Three times a day (TID) | INTRAMUSCULAR | Status: DC
  Administered 2013-09-07 – 2013-09-09 (×7): 5000 [IU] via SUBCUTANEOUS
  Filled 2013-09-07 (×8): qty 1

## 2013-09-07 MED ORDER — DULOXETINE HCL 30 MG PO CPEP
30.0000 mg | ORAL_CAPSULE | Freq: Every day | ORAL | Status: DC
  Administered 2013-09-08 – 2013-09-09 (×2): 30 mg via ORAL
  Filled 2013-09-07 (×2): qty 1

## 2013-09-07 MED ORDER — ALPRAZOLAM 1 MG PO TABS
1.0000 mg | ORAL_TABLET | Freq: Three times a day (TID) | ORAL | Status: DC
  Administered 2013-09-07 – 2013-09-09 (×7): 1 mg via ORAL
  Filled 2013-09-07 (×7): qty 1

## 2013-09-07 MED ORDER — VANCOMYCIN HCL IN DEXTROSE 1-5 GM/200ML-% IV SOLN
1000.0000 mg | INTRAVENOUS | Status: AC
  Administered 2013-09-07: 1000 mg via INTRAVENOUS
  Filled 2013-09-07: qty 200

## 2013-09-07 MED ORDER — VANCOMYCIN HCL IN DEXTROSE 750-5 MG/150ML-% IV SOLN
750.0000 mg | Freq: Two times a day (BID) | INTRAVENOUS | Status: DC
  Administered 2013-09-08 – 2013-09-10 (×5): 750 mg via INTRAVENOUS
  Filled 2013-09-07 (×12): qty 150

## 2013-09-07 NOTE — ED Notes (Signed)
Dr [facility]ook [facility] at bedside,

## 2013-09-07 NOTE — Progress Notes (Signed)
ANTIBIOTIC CONSULT NOTE - INITIAL  Pharmacy Consult for Vancomycin Indication: wound infection  Allergies  Allergen Reactions  . Clonazepam Other (See Comments)    Homicidal thoughts  . Tramadol Itching and Other (See Comments)    Really bad itching, seizures Pt reports being able to tolerate morphine, dilaudid, percocet, vicodin with no problems noted in the past.  . Flexeril [Cyclobenzaprine Hcl] Other (See Comments)    'really bad muscle spasms'  . Naproxen Nausea And Vomiting and Other (See Comments)    Bad abdominal pain Pt can take ibuprofen  . Prednisone     Hallucinations,  Anger   . Trazodone And Nefazodone Other (See Comments)    Night terror  . Cefixime Hives  . Vistaril [Hydroxyzine Hcl] Other (See Comments)    'makes me really edgy'   Patient Measurements: Height: 5\' 5"  (165.1 cm) Weight: 124 lb 6.4 oz (56.427 kg) IBW/kg (Calculated) : 57  Vital Signs: Temp: 97.9 F (36.6 C) (07/07 1644) Temp src: Oral (07/07 1644) BP: 100/61 mmHg (07/07 1644) Pulse Rate: 84 (07/07 1644) Intake/Output from previous day:   Intake/Output from this shift:    Labs:  Recent Labs  09/07/13 1342  WBC 9.6  HGB 10.3*  PLT 250  CREATININE 0.51   Estimated Creatinine Clearance: 96.5 ml/min (by C-G formula based on Cr of 0.51). No results found for this basename: VANCOTROUGH, VANCOPEAK, VANCORANDOM, GENTTROUGH, GENTPEAK, GENTRANDOM, TOBRATROUGH, TOBRAPEAK, TOBRARND, AMIKACINPEAK, AMIKACINTROU, AMIKACIN,  in the last 72 hours   Microbiology: No results found for this or any previous visit (from the past 720 hour(s)).  Medical History: Past Medical History  Diagnosis Date  . Bipolar 1 disorder   . Depression   . Anxiety   . Bipolar disorder   . PONV (postoperative nausea and vomiting)   . Headache(784.0)   . GERD (gastroesophageal reflux disease)     pregnancy related-rolaids prn   Vancomycin 7/7 >>  Assessment: 23yo female admitted with wound infection.  Asked  to initiate Vancomycin.  Pt has good renal fxn.  Estimated Creatinine Clearance: 96.5 ml/min (by C-G formula based on Cr of 0.51).  Goal of Therapy:  Vancomycin trough level 10-15 mcg/ml  Plan:  Vancomycin 1000mg  IV x 1 on admission then Vancomycin 750mg  IV q12hrs Check trough at steady state Monitor labs, renal fxn, and cultures  Valrie HartHall, Yaviel Kloster A 09/07/2013,5:17 PM

## 2013-09-07 NOTE — ED Notes (Signed)
Pt presents with abscess-like area to left axillary area. Pt reports "some" drainage from site". Pt also reports vomiting since last night.

## 2013-09-07 NOTE — H&P (Signed)
[facility] History and Physical  Tonya Harmon WUJ:[id number] DOB: 12/31/1990 DOA: 09/07/2013  Referring physician: ER. PCP: Milana ObeyKNOWLTON,STEPHEN D, MD   Chief Complaint: Left axillary pain and swelling.  HPI: Tonya Harmon is a 23 y.o. female  This is a 23 year old lady who presents with a one-week history of left hip joint pain and swelling with redness. She was diagnosed with cellulitis and oral antibiotics have not helped her. Further evaluation in the emergency room with the CT chest scan is suggestive of a large area of infection and possibly a mass/abscess with possible underlying malignancy. She is now being admitted for further treatment. She denies any specific injury to the area or lacerations that would have a port of entry for infection. She is nondiabetic. She has no history of drug abuse.   Review of Systems:  Constitutional:  No weight loss, night sweats, Fevers, chills, fatigue.  HEENT:  No headaches, Difficulty swallowing,Tooth/dental problems,Sore throat,  No sneezing, itching, ear ache, nasal congestion, post nasal drip,  Cardio-vascular:  No chest pain, Orthopnea, PND, swelling in lower extremities, anasarca, dizziness, palpitations  GI:  No heartburn, indigestion, abdominal pain, nausea, vomiting, diarrhea, change in bowel habits, loss of appetite  Resp:  No shortness of breath with exertion or at rest. No excess mucus, no productive cough, No non-productive cough, No coughing up of blood.No change in color of mucus.No wheezing.No chest wall deformity  GU:  no dysuria, change in color of lesions.   urine, no urgency or frequency. No flank pain.  Musculoskeletal:  No joint pain or swelling. No decreased range of motion. No back pain.  Psych:  No change in mood or affect. No depression or anxiety. No memory loss.   Past Medical History  Diagnosis Date  . Bipolar 1 disorder   . Depression   . Anxiety   . Bipolar disorder   . PONV  (postoperative nausea and vomiting)   . Headache(784.0)   . GERD (gastroesophageal reflux disease)     pregnancy related-rolaids prn   Past Surgical History  Procedure Laterality Date  . Cesarean section  2011,2008  . Cesarean section N/A 05/18/2012    Procedure: CESAREAN SECTION;  Surgeon: Tilda BurrowJohn V Ferguson, MD;  Location: [facility];  Service: Obstetrics;  Laterality: N/A;   Social History:  reports that she has never smoked. She has never used smokeless tobacco. She reports that she does not drink alcohol or use illicit drugs.  Allergies  Allergen Reactions  . Clonazepam Other (See Comments)    Homicidal thoughts  . Tramadol Itching and Other (See Comments)    Really bad itching, seizures Pt reports being able to tolerate morphine, dilaudid, percocet, vicodin with no problems noted in the past.  . Flexeril [Cyclobenzaprine Hcl] Other (See Comments)    'really bad muscle spasms'  . Naproxen Nausea And Vomiting and Other (See Comments)    Bad abdominal pain Pt can take ibuprofen  . Prednisone     Hallucinations,  Anger   . Trazodone And Nefazodone Other (See Comments)    Night terror  . Cefixime Hives  . Vistaril [Hydroxyzine Hcl] Other (See Comments)    'makes me really edgy'    Family History  Problem Relation Age of Onset  . Diabetes Other   . Hypertension Other   . Cancer Other   . Coronary artery disease Other      Prior to Admission medications   Medication Sig Start Date End Date Taking? Authorizing Provider  ALPRAZolam Prudy Feeler(XANAX) 1 MG  tablet Take 1 mg by mouth 3 (three) times daily. Patient takes one tablet in the morning and 2 at night   Yes Historical Provider, MD  doxycycline (VIBRAMYCIN) 100 MG capsule Take 1 capsule (100 mg total) by mouth 2 (two) times daily. For 10 days 09/03/13  Yes Tammy L. Triplett, PA-C  DULoxetine (CYMBALTA) 30 MG capsule Take 30 mg by mouth daily.   Yes Historical Provider, MD  HYDROcodone-acetaminophen (NORCO/VICODIN) 5-325 MG per tablet Take  1 tablet by mouth 2 (two) times daily.   Yes Historical Provider, MD  ibuprofen (ADVIL,MOTRIN) 800 MG tablet Take 800 mg by mouth every 8 (eight) hours as needed for moderate pain.   Yes Historical Provider, MD  oxyCODONE-acetaminophen (PERCOCET/ROXICET) 5-325 MG per tablet Take 1 tablet by mouth every 4 (four) hours as needed. 09/03/13  Yes Tammy L. Triplett, PA-C  temazepam (RESTORIL) 30 MG capsule Take 30 mg by mouth at bedtime.   Yes Historical Provider, MD  etonogestrel (NEXPLANON) 68 MG IMPL implant Inject 1 each into the skin once.    Historical Provider, MD  SUMAtriptan (IMITREX) 25 MG tablet Take 25 mg by mouth every 2 (two) hours as needed for migraine or headache. May repeat in 2 hours if headache persists or recurs.    Historical Provider, MD   Physical Exam: Filed Vitals:   09/07/13 1644  BP: 100/61  Pulse: 84  Temp: 97.9 F (36.6 C)  Resp: 16    BP 100/61  Pulse 84  Temp(Src) 97.9 F (36.6 C) (Oral)  Resp 16  Ht 5\' 5"  (1.651 m)  Wt 56.427 kg (124 lb 6.4 oz)  BMI 20.70 kg/m2  SpO2 100%  General:  Appears in pain. She is nontoxic or septic clinically. Eyes: PERRL, normal lids, irises & conjunctiva ENT: grossly normal hearing, lips & tongue Neck: no LAD, masses or thyromegaly. There is no supraclavicular lymphadenopathy. Cardiovascular: RRR, no m/r/g. No LE edema. Telemetry: SR, no arrhythmias  Respiratory: CTA bilaterally, no w/r/r. Normal respiratory effort. Abdomen: soft, ntnd. There are no breast masses on the left side especially. There is cellulitis affecting the left breast. Skin: She has cellulitis affecting the left axilla, left upper chest area. Musculoskeletal: grossly normal tone BUE/BLE Psychiatric: grossly normal mood and affect, speech fluent and appropriate Neurologic: grossly non-focal.          Labs on Admission:  Basic Metabolic Panel:  Recent Labs Lab 09/07/13 1342  NA 145  K 3.8  CL 105  CO2 28  GLUCOSE 98  BUN 13  CREATININE 0.51    CALCIUM 9.4   Liver Function Tests:  Recent Labs Lab 09/07/13 1342  AST 14  ALT 15  ALKPHOS 137*  BILITOT 0.4  PROT 7.2  ALBUMIN 3.3*     CBC:  Recent Labs Lab 09/07/13 1342  WBC 9.6  NEUTROABS 7.9*  HGB 10.3*  HCT 30.8*  MCV 89.0  PLT 250     Radiological Exams on Admission: Ct Chest W Contrast  09/07/2013   CLINICAL DATA:  Left axillary and upper chest mass noted for 5 days  EXAM: CT CHEST WITH CONTRAST  TECHNIQUE: Multidetector CT imaging of the chest was performed during intravenous contrast administration.  CONTRAST:  80mL OMNIPAQUE IOHEXOL 300 MG/ML  SOLN  COMPARISON:  None.  FINDINGS: There is an abnormal soft tissue density (16 HU) mass centered in the lateral aspect of the left breast extending into the axillary region. Its margins are ill defined. There are vascular structures within it.  The overlying skin surface is deformed. There are mildly enlarged left axillary lymph nodes. There is also mild prominence of the anterior aspect of the latissimus muscle on the left consistent with edema hemorrhage. There is no intra thoracic extension of the mass. The lung parenchyma exhibits no parenchymal masses. There is a 2 mm diameter subpleural nodule in the right upper lobe posteriorly on image 13.  The heart and mediastinum structures are normal. There is no pleural nor pericardial effusion. There is no intrathoracic lymphadenopathy.  Within the upper abdomen the observed portions of the liver are normal. The spleen is borderline enlarged but is incompletely included in the field of view.  The observed portions of the left clavicle and scapula are intact. No lytic or blastic rib lesion is demonstrated. The vertebral bodies and sternum are normal.  IMPRESSION: 1. There is an abnormal mass in the lateral aspect of the left breast extending into the left axillary region consistent with the clinically suspected abscess. Inflammatory malignancy or less likely hemorrhage could produce  similar findings. 2. There is no intra thoracic extension of the abnormalities. 3. No acute cardiopulmonary abnormality is demonstrated. There is a 2 mm diameter subpleural right upper lobe nodule. 4. There is splenomegaly. 5. These results were called by telephone at the time of interpretation on 09/07/2013 at 2:43 PM to Dr. Kerrie Buffalo , who verbally acknowledged these results.   Electronically Signed   By: David  [place]   On: 09/07/2013 14:43      Assessment/Plan   1. Left axillary cellulitis with an abnormal mass in the lateral aspect of the left breast extending into the axillary region, consistent with abscess. There is concern over inflammatory malignancy. 2. Anxiety and depression.  Plan: 1. Admit to medical floor. 2. Intravenous vancomycin for cellulitis. 3. Surgical consultation with a view to possible incision and drainage and/or biopsy.  Further recommendations will depend on patient's hospital progress.   Code Status: Full code.  Family Communication: I discussed the plan with the patient at the bedside.   Disposition Plan: Home when medically stable.   Time spent: 60 minutes.  Wilson Singer [facility] Pager [phone].  **Disclaimer: This note may have been dictated with voice recognition software. Similar sounding words can inadvertently be transcribed and this note may contain transcription errors which may not have been corrected upon publication of note.**

## 2013-09-07 NOTE — ED Notes (Signed)
Hope NP at bedside,  

## 2013-09-07 NOTE — ED Provider Notes (Signed)
CSN: [id number]     Arrival date & time 09/07/13  1206 History   First MD Initiated Contact with Patient 09/07/13 1213     Chief Complaint  Patient presents with  . Abscess     (Consider location/radiation/quality/duration/timing/severity/associated sxs/prior Treatment) Patient is a 23 y.o. female presenting with abscess. The history is provided by the patient.  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 female who presents to the ED with pain and swelling to the left axilla. She states she was here a few days ago and treated for an infection to the axilla. The ER doctor did a bedside ultrasound that day and told her he did not see an abscess. She has been taking her doxycycline and the symptoms have worsened. Today when she woke she had a knot to the left axilla tat extended to the breast. She complains of increased pain and swelling.  She reports nausea and vomiting today. She also reports noting a small amount of black drainage from the area earlier today.  Past Medical History  Diagnosis Date  . Bipolar 1 disorder   . Depression   . Anxiety   . Bipolar disorder   . PONV (postoperative nausea and vomiting)   . Headache(784.0)   . GERD (gastroesophageal reflux disease)     pregnancy related-rolaids prn   Past Surgical History  Procedure Laterality Date  . Cesarean section  2011,2008  . Cesarean section N/A 05/18/2012    Procedure: CESAREAN SECTION;  Surgeon:  Tilda Burrow, MD;  Location: [facility];  Service: Obstetrics;  Laterality: N/A;   Family History  Problem Relation Age of Onset  . Diabetes Other   . Hypertension Other   . Cancer Other   . Coronary artery disease Other    History  Substance Use Topics  . Smoking status: Never Smoker   . Smokeless tobacco: Never Used  . Alcohol Use: No   OB History   Grav Para Term Preterm Abortions TAB SAB Ect Mult Living   3 3 3       3      Review of Systems  Constitutional: Positive for fever and chills.  Eyes: Negative for visual disturbance.  Respiratory: Negative for cough.   Gastrointestinal: Positive for nausea and vomiting.  Genitourinary: Negative for dysuria and urgency.  Musculoskeletal:       Pain and swelling left axilla and left upper chest wall  Skin: Positive for wound.  Neurological: Positive for headaches. Negative for syncope and light-headedness.  Psychiatric/Behavioral: Negative for confusion. The patient is not nervous/anxious.       Allergies  Clonazepam; Tramadol; Flexeril; Naproxen; Prednisone; Cefixime; and Vistaril  Home Medications   Prior to Admission medications   Medication Sig Start Date End Date Taking? Authorizing Provider  ALPRAZolam Prudy Feeler) 1 MG tablet Take 1 mg by mouth 3 (three) times daily.    Yes Historical Provider, MD  doxycycline (VIBRAMYCIN) 100 MG capsule Take 1 capsule (  100 mg total) by mouth 2 (two) times daily. For 10 days 09/03/13  Yes Tammy L. Triplett, PA-C  DULoxetine (CYMBALTA) 30 MG capsule Take 30 mg by mouth daily.   Yes Historical Provider, MD  HYDROcodone-acetaminophen (NORCO/VICODIN) 5-325 MG per tablet Take 1 tablet by mouth 2 (two) times daily.   Yes Historical Provider, MD  ibuprofen (ADVIL,MOTRIN) 800 MG tablet Take 800 mg by mouth every 8 (eight) hours as needed for moderate pain.   Yes Historical Provider, MD  oxyCODONE-acetaminophen (PERCOCET/ROXICET) 5-325 MG per tablet Take 1 tablet by mouth every 4 (four) hours as  needed. 09/03/13  Yes Tammy L. Triplett, PA-C  etonogestrel (NEXPLANON) 68 MG IMPL implant Inject 1 each into the skin once.    Historical Provider, MD  SUMAtriptan (IMITREX) 25 MG tablet Take 25 mg by mouth every 2 (two) hours as needed for migraine or headache. May repeat in 2 hours if headache persists or recurs.    Historical Provider, MD   BP 109/69  Pulse 82  Temp(Src) 98.3 F (36.8 C) (Oral)  Resp 18  SpO2 100% Physical Exam  Nursing note and vitals reviewed. Constitutional: She is oriented to person, place, and time. She appears well-developed and well-nourished. No distress.  HENT:  Head: Normocephalic.  Eyes: Conjunctivae and EOM are normal.  Neck: Normal range of motion. Neck supple.  Cardiovascular: Normal rate.   Pulmonary/Chest: Effort normal.  Abdominal: Soft. There is no tenderness.  Musculoskeletal:  Mid left axilla with one small raised tender area noted. There is a second area that starts in the medial aspect of the axilla and extends to the left chest wall and the left breast. The second area is approximately 8 cm. The area is tender and raised.   Neurological: She is alert and oriented to person, place, and time. No cranial nerve deficit.  Skin: Skin is warm and dry.  Psychiatric: She has a normal mood and affect. Her behavior is normal.   Results for orders placed during the hospital encounter of 09/07/13 (from the past 24 hour(s))  URINALYSIS, ROUTINE W REFLEX MICROSCOPIC     Status: None   Collection Time    09/07/13  1:40 PM      Result Value Ref Range   Color, Urine YELLOW  YELLOW   APPearance CLEAR  CLEAR   Specific Gravity, Urine 1.010  1.005 - 1.030   pH 5.5  5.0 - 8.0   Glucose, UA NEGATIVE  NEGATIVE mg/dL   Hgb urine dipstick NEGATIVE  NEGATIVE   Bilirubin Urine NEGATIVE  NEGATIVE   Ketones, ur NEGATIVE  NEGATIVE mg/dL   Protein, ur NEGATIVE  NEGATIVE mg/dL   Urobilinogen, UA 0.2  0.0 - 1.0 mg/dL   Nitrite NEGATIVE  NEGATIVE   Leukocytes, UA  NEGATIVE  NEGATIVE  PREGNANCY, URINE     Status: None   Collection Time    09/07/13  1:40 PM      Result Value Ref Range   Preg Test, Ur NEGATIVE  NEGATIVE  CBC WITH DIFFERENTIAL     Status: Abnormal   Collection Time    09/07/13  1:42 PM      Result Value Ref Range   WBC 9.6  4.0 - 10.5 K/uL   RBC 3.46 (*) 3.87 - 5.11 MIL/uL   Hemoglobin 10.3 (*) 12.0 - 15.0 g/dL   HCT 16.130.8 (*) 09.636.0 - 04.546.0 %   MCV 89.0  78.0 - 100.0 fL   MCH 29.8  26.0 - 34.0 pg  MCHC 33.4  30.0 - 36.0 g/dL   RDW 40.914.4  81.111.5 - 91.415.5 %   Platelets 250  150 - 400 K/uL   Neutrophils Relative % 82 (*) 43 - 77 %   Neutro Abs 7.9 (*) 1.7 - 7.7 K/uL   Lymphocytes Relative 11 (*) 12 - 46 %   Lymphs Abs 1.0  0.7 - 4.0 K/uL   Monocytes Relative 7  3 - 12 %   Monocytes Absolute 0.6  0.1 - 1.0 K/uL   Eosinophils Relative 0  0 - 5 %   Eosinophils Absolute 0.0  0.0 - 0.7 K/uL   Basophils Relative 0  0 - 1 %   Basophils Absolute 0.0  0.0 - 0.1 K/uL  COMPREHENSIVE METABOLIC PANEL     Status: Abnormal   Collection Time    09/07/13  1:42 PM      Result Value Ref Range   Sodium 145  137 - 147 mEq/L   Potassium 3.8  3.7 - 5.3 mEq/L   Chloride 105  96 - 112 mEq/L   CO2 28  19 - 32 mEq/L   Glucose, Bld 98  70 - 99 mg/dL   BUN 13  6 - 23 mg/dL   Creatinine, Ser 7.820.51  0.50 - 1.10 mg/dL   Calcium 9.4  8.4 - 95.610.5 mg/dL   Total Protein 7.2  6.0 - 8.3 g/dL   Albumin 3.3 (*) 3.5 - 5.2 g/dL   AST 14  0 - 37 U/L   ALT 15  0 - 35 U/L   Alkaline Phosphatase 137 (*) 39 - 117 U/L   Total Bilirubin 0.4  0.3 - 1.2 mg/dL   GFR calc non Af Amer >90  >90 mL/min   GFR calc Af Amer >90  >90 mL/min   Anion gap 12  5 - 15    ED Course: Call from Radiologist and he is concerned that the patient may have an extensive abscess vs inflammatory malignant  Breast disease.   Procedures Labs, IV, pain management, nausea management, IV Vancomycin  Ct Chest W Contrast  09/07/2013   CLINICAL DATA:  Left axillary and upper chest mass noted for 5 days   EXAM: CT CHEST WITH CONTRAST  TECHNIQUE: Multidetector CT imaging of the chest was performed during intravenous contrast administration.  CONTRAST:  80mL OMNIPAQUE IOHEXOL 300 MG/ML  SOLN  COMPARISON:  None.  FINDINGS: There is an abnormal soft tissue density (16 HU) mass centered in the lateral aspect of the left breast extending into the axillary region. Its margins are ill defined. There are vascular structures within it. The overlying skin surface is deformed. There are mildly enlarged left axillary lymph nodes. There is also mild prominence of the anterior aspect of the latissimus muscle on the left consistent with edema hemorrhage. There is no intra thoracic extension of the mass. The lung parenchyma exhibits no parenchymal masses. There is a 2 mm diameter subpleural nodule in the right upper lobe posteriorly on image 13.  The heart and mediastinum structures are normal. There is no pleural nor pericardial effusion. There is no intrathoracic lymphadenopathy.  Within the upper abdomen the observed portions of the liver are normal. The spleen is borderline enlarged but is incompletely included in the field of view.  The observed portions of the left clavicle and scapula are intact. No lytic or blastic rib lesion is demonstrated. The vertebral bodies and sternum are normal.  IMPRESSION: 1. There is an abnormal mass in the lateral  aspect of the left breast extending into the left axillary region consistent with the clinically suspected abscess. Inflammatory malignancy or less likely hemorrhage could produce similar findings. 2. There is no intra thoracic extension of the abnormalities. 3. No acute cardiopulmonary abnormality is demonstrated. There is a 2 mm diameter subpleural right upper lobe nodule. 4. There is splenomegaly. 5. These results were called by telephone at the time of interpretation on 09/07/2013 at 2:43 PM to Dr. Kerrie Buffalo , who verbally acknowledged these results.   Electronically Signed   By:  David  [place]   On: 09/07/2013 14:43    MDM  23 y.o. female with tenderness and mass to the left axilla extending to the left breast. Consult with Dr. Karilyn Cota and he will admit the patient.     [facility] Orlene Och, [place] 09/07/13 1536

## 2013-09-08 LAB — COMPREHENSIVE METABOLIC PANEL
ALT: 11 U/L (ref 0–35)
AST: 12 U/L (ref 0–37)
Albumin: 2.5 g/dL — ABNORMAL LOW (ref 3.5–5.2)
Alkaline Phosphatase: 123 U/L — ABNORMAL HIGH (ref 39–117)
Anion gap: 13 (ref 5–15)
BUN: 10 mg/dL (ref 6–23)
CO2: 26 mEq/L (ref 19–32)
Calcium: 8.5 mg/dL (ref 8.4–10.5)
Chloride: 105 mEq/L (ref 96–112)
Creatinine, Ser: 0.47 mg/dL — ABNORMAL LOW (ref 0.50–1.10)
GFR calc Af Amer: >90 mL/min (ref >90)
GFR calc non Af Amer: >90 mL/min (ref >90)
Glucose, Bld: 125 mg/dL — ABNORMAL HIGH (ref 70–99)
Potassium: 3.6 mEq/L — ABNORMAL LOW (ref 3.7–5.3)
Sodium: 144 mEq/L (ref 137–147)
Total Bilirubin: 0.2 mg/dL — ABNORMAL LOW (ref 0.3–1.2)
Total Protein: 5.9 g/dL — ABNORMAL LOW (ref 6.0–8.3)

## 2013-09-08 LAB — CBC
HCT: 27.4 % — ABNORMAL LOW (ref 36.0–46.0)
Hemoglobin: 9.3 g/dL — ABNORMAL LOW (ref 12.0–15.0)
MCH: 30.3 pg (ref 26.0–34.0)
MCHC: 33.9 g/dL (ref 30.0–36.0)
MCV: 89.3 fL (ref 78.0–100.0)
Platelets: 232 10*3/uL (ref 150–400)
RBC: 3.07 MIL/uL — ABNORMAL LOW (ref 3.87–5.11)
RDW: 14.3 % (ref 11.5–15.5)
WBC: 8 10*3/uL (ref 4.0–10.5)

## 2013-09-08 MED ORDER — HYDROMORPHONE HCL PF 1 MG/ML IJ SOLN
1.0000 mg | INTRAMUSCULAR | Status: DC | PRN
  Administered 2013-09-08: 1 mg via INTRAVENOUS
  Filled 2013-09-08: qty 1

## 2013-09-08 MED ORDER — OXYCODONE HCL 5 MG PO TABS
5.0000 mg | ORAL_TABLET | ORAL | Status: DC | PRN
  Administered 2013-09-08 – 2013-09-09 (×2): 5 mg via ORAL
  Filled 2013-09-08 (×2): qty 1

## 2013-09-08 MED ORDER — PROMETHAZINE HCL 25 MG/ML IJ SOLN
12.5000 mg | Freq: Four times a day (QID) | INTRAMUSCULAR | Status: DC | PRN
  Administered 2013-09-08: 12.5 mg via INTRAVENOUS
  Filled 2013-09-08: qty 1

## 2013-09-08 MED ORDER — ONDANSETRON HCL 4 MG/2ML IJ SOLN
4.0000 mg | Freq: Three times a day (TID) | INTRAMUSCULAR | Status: DC | PRN
  Administered 2013-09-08: 4 mg via INTRAVENOUS
  Filled 2013-09-08: qty 2

## 2013-09-08 MED ORDER — HYDROMORPHONE HCL PF 1 MG/ML IJ SOLN
1.0000 mg | INTRAMUSCULAR | Status: DC | PRN
  Administered 2013-09-08 – 2013-09-10 (×17): 1 mg via INTRAVENOUS
  Filled 2013-09-08 (×16): qty 1

## 2013-09-08 MED ORDER — PIPERACILLIN-TAZOBACTAM 3.375 G IVPB
3.3750 g | Freq: Three times a day (TID) | INTRAVENOUS | Status: DC
  Administered 2013-09-08 – 2013-09-10 (×6): 3.375 g via INTRAVENOUS
  Filled 2013-09-08 (×16): qty 50

## 2013-09-08 NOTE — Progress Notes (Signed)
ANTIBIOTIC CONSULT NOTE - follow up  Pharmacy Consult for Vancomycin and Zosyn Indication: wound infection  Allergies  Allergen Reactions  . Clonazepam Other (See Comments)    Homicidal thoughts  . Tramadol Itching and Other (See Comments)    Really bad itching, seizures Pt reports being able to tolerate morphine, dilaudid, percocet, vicodin with no problems noted in the past.  . Flexeril [Cyclobenzaprine Hcl] Other (See Comments)    'really bad muscle spasms'  . Naproxen Nausea And Vomiting and Other (See Comments)    Bad abdominal pain Pt can take ibuprofen  . Prednisone     Hallucinations,  Anger   . Trazodone And Nefazodone Other (See Comments)    Night terror  . Cefixime Hives  . Vistaril [Hydroxyzine Hcl] Other (See Comments)    'makes me really edgy'   Patient Measurements: Height: 5\' 5"  (165.1 cm) Weight: 124 lb 6.4 oz (56.427 kg) IBW/kg (Calculated) : 57  Vital Signs: Temp: 98.3 F (36.8 C) (07/08 0559) Temp src: Oral (07/08 0559) BP: 95/61 mmHg (07/08 0559) Pulse Rate: 76 (07/08 0559) Intake/Output from previous day: 07/07 0701 - 07/08 0700 In: -  Out: 1650 [Urine:1650] Intake/Output from this shift:    Labs:  Recent Labs  09/07/13 1342 09/08/13 0513  WBC 9.6 8.0  HGB 10.3* 9.3*  PLT 250 232  CREATININE 0.51 0.47*   Estimated Creatinine Clearance: 96.5 ml/min (by C-G formula based on Cr of 0.47). No results found for this basename: VANCOTROUGH, VANCOPEAK, VANCORANDOM, GENTTROUGH, GENTPEAK, GENTRANDOM, TOBRATROUGH, TOBRAPEAK, TOBRARND, AMIKACINPEAK, AMIKACINTROU, AMIKACIN,  in the last 72 hours   Microbiology: No results found for this or any previous visit (from the past 720 hour(s)).  Medical History: Past Medical History  Diagnosis Date  . Bipolar 1 disorder   . Depression   . Anxiety   . Bipolar disorder   . PONV (postoperative nausea and vomiting)   . Headache(784.0)   . GERD (gastroesophageal reflux disease)     pregnancy  related-rolaids prn   Vancomycin 7/7 >> Zosyn 7/8 >>  Assessment: 23yo female admitted with wound infection. Vancomycin was started 7/7 and asked to add Zosyn today.  Pt has good renal fxn.  Pt is afebrile with normal WBC.   Estimated Creatinine Clearance: 96.5 ml/min (by C-G formula based on Cr of 0.47).  Goal of Therapy:  Vancomycin trough level 10-15 mcg/ml  Plan:  Continue Vancomycin 750mg  IV q12hrs Check trough at steady state Add Zosyn 3.375gm IV q8h, each dose over 4 hrs Monitor labs, renal fxn, and cultures Deescalate / transition to PO antibiotics when appropriate  Valrie HartHall, Berdie Malter A 09/08/2013,9:06 AM

## 2013-09-08 NOTE — Progress Notes (Signed)
Late entry: pt lost IV access at approximately 1700 this evening. [facility] and she attempted 2 IVs on her, but was unsuccessful. Pt was therefore unable to receive IV ABX this evening. Dr. Juanetta GoslingHawkins was made aware and order was received for po pain medications to help until IV access is regained. Sheryn BisonGordon, Kacen Mellinger Warner

## 2013-09-08 NOTE — Progress Notes (Signed)
Late entry for 1600. When  I entered the room at approximately 1600, pt asked me to help her to the bathroom because she had fallen earlier. When I asked her when she fell she stated that it was approximately 1300, after her parents had left. The fall was unwitnessed. Pt says that she fell to her knees. Pt is safe and no complications resulted. Sheryn BisonGordon, Nonie Lochner Warner

## 2013-09-08 NOTE — Consult Note (Signed)
Reason for Consult: Left breast/axillary abscess Referring Physician: Dr. Waldron Session Harmon is an 23 y.o. female.  HPI: Patient is a 23 year old white female presents with worsening pain and swelling high left breast towards the left axilla. She states she had a small pimple in the left axilla previously, but this new spot started worsening several days ago. She has a history of MRSA infection.  Past Medical History  Diagnosis Date  . Bipolar 1 disorder   . Depression   . Anxiety   . Bipolar disorder   . PONV (postoperative nausea and vomiting)   . Headache(784.0)   . GERD (gastroesophageal reflux disease)     pregnancy related-rolaids prn    Past Surgical History  Procedure Laterality Date  . Cesarean section  2011,2008  . Cesarean section N/A 05/18/2012    Procedure: CESAREAN SECTION;  Surgeon: Jonnie Kind, MD;  Location: [facility];  Service: Obstetrics;  Laterality: N/A;    Family History  Problem Relation Age of Onset  . Diabetes Other   . Hypertension Other   . Cancer Other   . Coronary artery disease Other     Social History:  reports that she has never smoked. She has never used smokeless tobacco. She reports that she does not drink alcohol or use illicit drugs.  Allergies:  Allergies  Allergen Reactions  . Clonazepam Other (See Comments)    Homicidal thoughts  . Tramadol Itching and Other (See Comments)    Really bad itching, seizures Pt reports being able to tolerate morphine, dilaudid, percocet, vicodin with no problems noted in the past.  . Flexeril [Cyclobenzaprine Hcl] Other (See Comments)    'really bad muscle spasms'  . Naproxen Nausea And Vomiting and Other (See Comments)    Bad abdominal pain Pt can take ibuprofen  . Prednisone     Hallucinations,  Anger   . Trazodone And Nefazodone Other (See Comments)    Night terror  . Cefixime Hives  . Vistaril [Hydroxyzine Hcl] Other (See Comments)    'makes me really edgy'    Medications: I  have reviewed the patient's current medications.  Results for orders placed during the hospital encounter of 09/07/13 (from the past 48 hour(s))  URINALYSIS, ROUTINE W REFLEX MICROSCOPIC     Status: None   Collection Time    09/07/13  1:40 PM      Result Value Ref Range   Color, Urine YELLOW  YELLOW   APPearance CLEAR  CLEAR   Specific Gravity, Urine 1.010  1.005 - 1.030   pH 5.5  5.0 - 8.0   Glucose, UA NEGATIVE  NEGATIVE mg/dL   Hgb urine dipstick NEGATIVE  NEGATIVE   Bilirubin Urine NEGATIVE  NEGATIVE   Ketones, ur NEGATIVE  NEGATIVE mg/dL   Protein, ur NEGATIVE  NEGATIVE mg/dL   Urobilinogen, UA 0.2  0.0 - 1.0 mg/dL   Nitrite NEGATIVE  NEGATIVE   Leukocytes, UA NEGATIVE  NEGATIVE   Comment: MICROSCOPIC NOT DONE ON URINES WITH NEGATIVE PROTEIN, BLOOD, LEUKOCYTES, NITRITE, OR GLUCOSE <1000 mg/dL.  PREGNANCY, URINE     Status: None   Collection Time    09/07/13  1:40 PM      Result Value Ref Range   Preg Test, Ur NEGATIVE  NEGATIVE  CBC WITH DIFFERENTIAL     Status: Abnormal   Collection Time    09/07/13  1:42 PM      Result Value Ref Range   WBC 9.6  4.0 - 10.5 K/uL  RBC 3.46 (*) 3.87 - 5.11 MIL/uL   Hemoglobin 10.3 (*) 12.0 - 15.0 g/dL   HCT 30.8 (*) 36.0 - 46.0 %   MCV 89.0  78.0 - 100.0 fL   MCH 29.8  26.0 - 34.0 pg   MCHC 33.4  30.0 - 36.0 g/dL   RDW 14.4  11.5 - 15.5 %   Platelets 250  150 - 400 K/uL   Neutrophils Relative % 82 (*) 43 - 77 %   Neutro Abs 7.9 (*) 1.7 - 7.7 K/uL   Lymphocytes Relative 11 (*) 12 - 46 %   Lymphs Abs 1.0  0.7 - 4.0 K/uL   Monocytes Relative 7  3 - 12 %   Monocytes Absolute 0.6  0.1 - 1.0 K/uL   Eosinophils Relative 0  0 - 5 %   Eosinophils Absolute 0.0  0.0 - 0.7 K/uL   Basophils Relative 0  0 - 1 %   Basophils Absolute 0.0  0.0 - 0.1 K/uL  COMPREHENSIVE METABOLIC PANEL     Status: Abnormal   Collection Time    09/07/13  1:42 PM      Result Value Ref Range   Sodium 145  137 - 147 mEq/L   Potassium 3.8  3.7 - 5.3 mEq/L    Chloride 105  96 - 112 mEq/L   CO2 28  19 - 32 mEq/L   Glucose, Bld 98  70 - 99 mg/dL   BUN 13  6 - 23 mg/dL   Creatinine, Ser 0.51  0.50 - 1.10 mg/dL   Calcium 9.4  8.4 - 10.5 mg/dL   Total Protein 7.2  6.0 - 8.3 g/dL   Albumin 3.3 (*) 3.5 - 5.2 g/dL   AST 14  0 - 37 U/L   ALT 15  0 - 35 U/L   Alkaline Phosphatase 137 (*) 39 - 117 U/L   Total Bilirubin 0.4  0.3 - 1.2 mg/dL   GFR calc non Af Amer >90  >90 mL/min   GFR calc Af Amer >90  >90 mL/min   Comment: (NOTE)     The eGFR has been calculated using the CKD EPI equation.     This calculation has not been validated in all clinical situations.     eGFR's persistently <90 mL/min signify possible Chronic Kidney     Disease.   Anion gap 12  5 - 15  COMPREHENSIVE METABOLIC PANEL     Status: Abnormal   Collection Time    09/08/13  5:13 AM      Result Value Ref Range   Sodium 144  137 - 147 mEq/L   Potassium 3.6 (*) 3.7 - 5.3 mEq/L   Chloride 105  96 - 112 mEq/L   CO2 26  19 - 32 mEq/L   Glucose, Bld 125 (*) 70 - 99 mg/dL   BUN 10  6 - 23 mg/dL   Creatinine, Ser 0.47 (*) 0.50 - 1.10 mg/dL   Calcium 8.5  8.4 - 10.5 mg/dL   Total Protein 5.9 (*) 6.0 - 8.3 g/dL   Albumin 2.5 (*) 3.5 - 5.2 g/dL   AST 12  0 - 37 U/L   ALT 11  0 - 35 U/L   Alkaline Phosphatase 123 (*) 39 - 117 U/L   Total Bilirubin 0.2 (*) 0.3 - 1.2 mg/dL   GFR calc non Af Amer >90  >90 mL/min   GFR calc Af Amer >90  >90 mL/min   Comment: (NOTE)  The eGFR has been calculated using the CKD EPI equation.     This calculation has not been validated in all clinical situations.     eGFR's persistently <90 mL/min signify possible Chronic Kidney     Disease.   Anion gap 13  5 - 15  CBC     Status: Abnormal   Collection Time    09/08/13  5:13 AM      Result Value Ref Range   WBC 8.0  4.0 - 10.5 K/uL   RBC 3.07 (*) 3.87 - 5.11 MIL/uL   Hemoglobin 9.3 (*) 12.0 - 15.0 g/dL   HCT 27.4 (*) 36.0 - 46.0 %   MCV 89.3  78.0 - 100.0 fL   MCH 30.3  26.0 - 34.0 pg   MCHC  33.9  30.0 - 36.0 g/dL   RDW 14.3  11.5 - 15.5 %   Platelets 232  150 - 400 K/uL    Ct Chest W Contrast  09/07/2013   CLINICAL DATA:  Left axillary and upper chest mass noted for 5 days  EXAM: CT CHEST WITH CONTRAST  TECHNIQUE: Multidetector CT imaging of the chest was performed during intravenous contrast administration.  CONTRAST:  26m OMNIPAQUE IOHEXOL 300 MG/ML  SOLN  COMPARISON:  None.  FINDINGS: There is an abnormal soft tissue density (16 HU) mass centered in the lateral aspect of the left breast extending into the axillary region. Its margins are ill defined. There are vascular structures within it. The overlying skin surface is deformed. There are mildly enlarged left axillary lymph nodes. There is also mild prominence of the anterior aspect of the latissimus muscle on the left consistent with edema hemorrhage. There is no intra thoracic extension of the mass. The lung parenchyma exhibits no parenchymal masses. There is a 2 mm diameter subpleural nodule in the right upper lobe posteriorly on image 13.  The heart and mediastinum structures are normal. There is no pleural nor pericardial effusion. There is no intrathoracic lymphadenopathy.  Within the upper abdomen the observed portions of the liver are normal. The spleen is borderline enlarged but is incompletely included in the field of view.  The observed portions of the left clavicle and scapula are intact. No lytic or blastic rib lesion is demonstrated. The vertebral bodies and sternum are normal.  IMPRESSION: 1. There is an abnormal mass in the lateral aspect of the left breast extending into the left axillary region consistent with the clinically suspected abscess. Inflammatory malignancy or less likely hemorrhage could produce similar findings. 2. There is no intra thoracic extension of the abnormalities. 3. No acute cardiopulmonary abnormality is demonstrated. There is a 2 mm diameter subpleural right upper lobe nodule. 4. There is  splenomegaly. 5. These results were called by telephone at the time of interpretation on 09/07/2013 at 2:43 PM to Dr. HDebroah Baller, who verbally acknowledged these results.   Electronically Signed   By: David  JMartinique  On: 09/07/2013 14:43    ROS: See chart Blood pressure 95/61, pulse 76, temperature 98.3 F (36.8 C), temperature source Oral, resp. rate 16, height _0  (1.651 m), weight 56.427 kg (124 lb 6.4 oz), SpO2 91.00%, not currently breastfeeding. Physical Exam: Anxious white female Left breast/axilla examination reveals an indurated area high in the upper, outer quadrant of the left breast at the level of the inferior portion of the axilla. Difficult to appreciate fluctuance. Extremely tender to touch. No purulent drainage noted.  Assessment/Plan: Impression: Developing cellulitis/abscess, most likely from  left axilla, though a primary breast abscess could also be present. Plan: Would continue IV antibiotics. Agree with adding Zosyn. Will at this cellulitis mature to become more fluctuant. Have temporarily scheduled her for incision and drainage of this area on 09/10/2013.  Tonya Harmon A 09/08/2013, 11:51 AM

## 2013-09-08 NOTE — Progress Notes (Signed)
UR chart review completed.  

## 2013-09-08 NOTE — Progress Notes (Signed)
Tonya Harmon WUJ:[id number] DOB: 02/03/1991 DOA: 09/07/2013 PCP: Milana ObeyKNOWLTON,STEPHEN D, MD   Subjective: This lady was admitted with left exerting cellulitis/abscess with possible underlying mass. She continues to have pain. She is on intravenous vancomycin. She is awaiting surgical consultation.           Physical Exam: Blood pressure 95/61, pulse 76, temperature 98.3 F (36.8 C), temperature source Oral, resp. rate 16, height 5\' 5"  (1.651 m), weight 56.427 kg (124 lb 6.4 oz), SpO2 91.00%, not currently breastfeeding. Nontoxic or septic. She still continues to have the cellulitis, does not seem to be improved.   Investigations:  No results found for this or any previous visit (from the past 240 hour(s)).   Basic Metabolic Panel:  Recent Labs  95/62/1305/09/15 1342 09/08/13 0513  NA 145 144  K 3.8 3.6*  CL 105 105  CO2 28 26  GLUCOSE 98 125*  BUN 13 10  CREATININE 0.51 0.47*  CALCIUM 9.4 8.5   Liver Function Tests:  Recent Labs  09/07/13 1342 09/08/13 0513  AST 14 12  ALT 15 11  ALKPHOS 137* 123*  BILITOT 0.4 0.2*  PROT 7.2 5.9*  ALBUMIN 3.3* 2.5*     CBC:  Recent Labs  09/07/13 1342 09/08/13 0513  WBC 9.6 8.0  NEUTROABS 7.9*  --   HGB 10.3* 9.3*  HCT 30.8* 27.4*  MCV 89.0 89.3  PLT 250 232    Ct Chest W Contrast  09/07/2013   CLINICAL DATA:  Left axillary and upper chest mass noted for 5 days  EXAM: CT CHEST WITH CONTRAST  TECHNIQUE: Multidetector CT imaging of the chest was performed during intravenous contrast administration.  CONTRAST:  80mL OMNIPAQUE IOHEXOL 300 MG/ML  SOLN  COMPARISON:  None.  FINDINGS: There is an abnormal soft tissue density (16 HU) mass centered in the lateral aspect of the left breast extending into the axillary region. Its margins are ill defined. There are vascular structures within it. The overlying skin surface is deformed. There are mildly enlarged left axillary lymph nodes. There is also mild prominence of the anterior  aspect of the latissimus muscle on the left consistent with edema hemorrhage. There is no intra thoracic extension of the mass. The lung parenchyma exhibits no parenchymal masses. There is a 2 mm diameter subpleural nodule in the right upper lobe posteriorly on image 13.  The heart and mediastinum structures are normal. There is no pleural nor pericardial effusion. There is no intrathoracic lymphadenopathy.  Within the upper abdomen the observed portions of the liver are normal. The spleen is borderline enlarged but is incompletely included in the field of view.  The observed portions of the left clavicle and scapula are intact. No lytic or blastic rib lesion is demonstrated. The vertebral bodies and sternum are normal.  IMPRESSION: 1. There is an abnormal mass in the lateral aspect of the left breast extending into the left axillary region consistent with the clinically suspected abscess. Inflammatory malignancy or less likely hemorrhage could produce similar findings. 2. There is no intra thoracic extension of the abnormalities. 3. No acute cardiopulmonary abnormality is demonstrated. There is a 2 mm diameter subpleural right upper lobe nodule. 4. There is splenomegaly. 5. These results were called by telephone at the time of interpretation on 09/07/2013 at 2:43 PM to Dr. Kerrie BuffaloHOPE NEESE , who verbally acknowledged these results.   Electronically Signed   By: David  [place]   On: 09/07/2013 14:43      Medications: I have reviewed the  patient's current medications.  Impression: 1. Left axillary cellulitis with possible underlying mass. 2. Anxiety and depression.     Plan: 1. Continue with intravenous vancomycin. Add intravenous Zosyn for broader coverage. 2. Await surgical consultation. I think she probably needs some sort of biopsy or incision and drainage.  Consultants:  Surgical consultation pending.   Procedures:  None.   Antibiotics:  Intravenous vancomycin and intravenous Zosyn.                    Code Status: Full code.  Family Communication: I discussed the plan with the patient's family and her at the bedside.   Disposition Plan: Home when medically stable.  Time spent: 15 minutes.   LOS: 1 day   Dallas Torok C   09/08/2013, 8:59 AM

## 2013-09-09 LAB — CBC
HCT: 28.5 % — ABNORMAL LOW (ref 36.0–46.0)
Hemoglobin: 9.7 g/dL — ABNORMAL LOW (ref 12.0–15.0)
MCH: 30.3 pg (ref 26.0–34.0)
MCHC: 34 g/dL (ref 30.0–36.0)
MCV: 89.1 fL (ref 78.0–100.0)
Platelets: 272 10*3/uL (ref 150–400)
RBC: 3.2 MIL/uL — ABNORMAL LOW (ref 3.87–5.11)
RDW: 14 % (ref 11.5–15.5)
WBC: 9.6 10*3/uL (ref 4.0–10.5)

## 2013-09-09 LAB — BASIC METABOLIC PANEL
Anion gap: 9 (ref 5–15)
BUN: 7 mg/dL (ref 6–23)
CO2: 30 mEq/L (ref 19–32)
Calcium: 8.6 mg/dL (ref 8.4–10.5)
Chloride: 102 mEq/L (ref 96–112)
Creatinine, Ser: 0.61 mg/dL (ref 0.50–1.10)
GFR calc Af Amer: >90 mL/min (ref >90)
GFR calc non Af Amer: >90 mL/min (ref >90)
Glucose, Bld: 91 mg/dL (ref 70–99)
Potassium: 3.3 mEq/L — ABNORMAL LOW (ref 3.7–5.3)
Sodium: 141 mEq/L (ref 137–147)

## 2013-09-09 LAB — SURGICAL PCR SCREEN
MRSA, PCR: NEGATIVE
Staphylococcus aureus: NEGATIVE

## 2013-09-09 MED ORDER — POTASSIUM CHLORIDE CRYS ER 20 MEQ PO TBCR
20.0000 meq | EXTENDED_RELEASE_TABLET | Freq: Two times a day (BID) | ORAL | Status: DC
  Administered 2013-09-09: 20 meq via ORAL
  Filled 2013-09-09: qty 1

## 2013-09-09 MED ORDER — POTASSIUM CHLORIDE CRYS ER 20 MEQ PO TBCR
40.0000 meq | EXTENDED_RELEASE_TABLET | Freq: Once | ORAL | Status: AC
  Administered 2013-09-09: 40 meq via ORAL
  Filled 2013-09-09: qty 2

## 2013-09-09 MED ORDER — POTASSIUM CHLORIDE CRYS ER 20 MEQ PO TBCR: 20.0000 meq | EXTENDED_RELEASE_TABLET | Freq: Two times a day (BID) | ORAL | Status: DC

## 2013-09-09 NOTE — Plan of Care (Signed)
Problem: Phase I Progression Outcomes Goal: Pain controlled with appropriate interventions Outcome: Progressing Pain level has been at approx 6 throughout the day which is an improvement per pt. Sheryn BisonGordon, Roseann Kees Warner

## 2013-09-09 NOTE — Progress Notes (Signed)
Tonya Harmon:[id number] DOB: [birth year] DOA: 09/07/2013 PCP: Milana Obey, MD   Subjective: This lady was admitted with left exerting cellulitis/abscess with possible underlying mass. She continues to have pain. She is on intravenous vancomycin and Zosyn. Surgical consultation is appreciated.           Physical Exam: Blood pressure 139/84, pulse 99, temperature 99.7 F (37.6 C), temperature source Oral, resp. rate 16, height 5\' 5"  (1.651 m), weight 56.427 kg (124 lb 6.4 oz), SpO2 99.00%, not currently breastfeeding. Nontoxic or septic. She still continues to have the cellulitis, does not seem to be improved.   Investigations:  No results found for this or any previous visit (from the past 240 hour(s)).   Basic Metabolic Panel:  Recent Labs  44/01/02 0513 09/09/13 0620  NA 144 141  K 3.6* 3.3*  CL 105 102  CO2 26 30  GLUCOSE 125* 91  BUN 10 7  CREATININE 0.47* 0.61  CALCIUM 8.5 8.6   Liver Function Tests:  Recent Labs  09/07/13 1342 09/08/13 0513  AST 14 12  ALT 15 11  ALKPHOS 137* 123*  BILITOT 0.4 0.2*  PROT 7.2 5.9*  ALBUMIN 3.3* 2.5*     CBC:  Recent Labs  09/07/13 1342 09/08/13 0513 09/09/13 0620  WBC 9.6 8.0 9.6  NEUTROABS 7.9*  --   --   HGB 10.3* 9.3* 9.7*  HCT 30.8* 27.4* 28.5*  MCV 89.0 89.3 89.1  PLT 250 232 272    Ct Chest W Contrast  09/07/2013   CLINICAL DATA:  Left axillary and upper chest mass noted for 5 days  EXAM: CT CHEST WITH CONTRAST  TECHNIQUE: Multidetector CT imaging of the chest was performed during intravenous contrast administration.  CONTRAST:  80mL OMNIPAQUE IOHEXOL 300 MG/ML  SOLN  COMPARISON:  None.  FINDINGS: There is an abnormal soft tissue density (16 HU) mass centered in the lateral aspect of the left breast extending into the axillary region. Its margins are ill defined. There are vascular structures within it. The overlying skin surface is deformed. There are mildly enlarged left axillary  lymph nodes. There is also mild prominence of the anterior aspect of the latissimus muscle on the left consistent with edema hemorrhage. There is no intra thoracic extension of the mass. The lung parenchyma exhibits no parenchymal masses. There is a 2 mm diameter subpleural nodule in the right upper lobe posteriorly on image 13.  The heart and mediastinum structures are normal. There is no pleural nor pericardial effusion. There is no intrathoracic lymphadenopathy.  Within the upper abdomen the observed portions of the liver are normal. The spleen is borderline enlarged but is incompletely included in the field of view.  The observed portions of the left clavicle and scapula are intact. No lytic or blastic rib lesion is demonstrated. The vertebral bodies and sternum are normal.  IMPRESSION: 1. There is an abnormal mass in the lateral aspect of the left breast extending into the left axillary region consistent with the clinically suspected abscess. Inflammatory malignancy or less likely hemorrhage could produce similar findings. 2. There is no intra thoracic extension of the abnormalities. 3. No acute cardiopulmonary abnormality is demonstrated. There is a 2 mm diameter subpleural right upper lobe nodule. 4. There is splenomegaly. 5. These results were called by telephone at the time of interpretation on 09/07/2013 at 2:43 PM to Dr. Kerrie Buffalo , who verbally acknowledged these results.   Electronically Signed   By: David  [place]   On: 09/07/2013  14:43      Medications: I have reviewed the patient's current medications.  Impression: 1. Left axillary cellulitis with possible underlying mass. 2. Anxiety and depression.     Plan: 1. Continue with intravenous vancomycin and Zosyn. 2. For incision and drainage tomorrow of the abscess.  Consultants:  Surgical consultation   Procedures:  None.   Antibiotics:  Intravenous vancomycin and intravenous Zosyn.                   Code Status: Full  code.  Family Communication: I discussed the plan with the patient's family and her at the bedside.   Disposition Plan: Home when medically stable.  Time spent: 15 minutes.   LOS: 2 days   Talicia Sui C   09/09/2013, 9:02 AM

## 2013-09-09 NOTE — Care Management Note (Signed)
    Page 1 of 1   09/09/2013     11:17:23 AM CARE MANAGEMENT NOTE 09/09/2013  Patient:  Tonya Harmon,Tonya Harmon   Account Number:  [id number]  Date Initiated:  09/09/2013  Documentation initiated by:  Sharrie RothmanBLACKWELL,Zaidyn Claire C  Subjective/Objective Assessment:   Pt admitted from home with cellulitis and abcess. Pt lives with her husband and 3 children and will return home at discharge. Pt is independent with ADL's.     Action/Plan:   Pt for surgery on 09/10/13. Will follow for possible HH needs for dressing changes.   Anticipated DC Date:  09/11/2013   Anticipated DC Plan:  HOME/SELF CARE      DC Planning Services  CM consult      Choice offered to / List presented to:             Status of service:  Completed, signed off Medicare Important Message given?   (If response is "NO", the following Medicare IM given date fields will be blank) Date Medicare IM given:   Medicare IM given by:   Date Additional Medicare IM given:   Additional Medicare IM given by:    Discharge Disposition:    Per UR Regulation:    If discussed at Long Length of Stay Meetings, dates discussed:    Comments:  09/09/13 1115 Arlyss Queenammy Tamlyn Sides, RN BSN CM

## 2013-09-09 NOTE — ED Provider Notes (Signed)
Medical screening examination/treatment/procedure(s) were conducted as a shared visit with non-physician practitioner(s) and myself.  I personally evaluated the patient during the encounter.   EKG Interpretation None     Large palpable mass on left superior anterior chest wall with some extension to the axilla. Suspect abscess or possible malignancy. CT scan pending. IV antibiotics. Admit to general medicine.  Donnetta HutchingBrian Seriah Brotzman, MD 09/09/13 [phone]

## 2013-09-09 NOTE — Progress Notes (Signed)
Left axillary induration still present and not draining. We'll proceed with incision and drainage of left axillary abscess tomorrow. The risks and benefits of the procedure were fully explained to the patient, who gave informed consent.

## 2013-09-10 ENCOUNTER — Encounter (HOSPITAL_COMMUNITY): Payer: Medicaid Other | Admitting: Anesthesiology

## 2013-09-10 ENCOUNTER — Inpatient Hospital Stay (HOSPITAL_COMMUNITY): Payer: Medicaid Other | Admitting: Anesthesiology

## 2013-09-10 ENCOUNTER — Encounter (HOSPITAL_COMMUNITY)
Admission: EM | Disposition: A | Discharge status: Home / Self Care | Payer: Self-pay | Source: Home / Self Care | Attending: Internal Medicine

## 2013-09-10 HISTORY — PX: INCISION AND DRAINAGE ABSCESS: SHX5864

## 2013-09-10 LAB — BASIC METABOLIC PANEL
Anion gap: 9 (ref 5–15)
BUN: 7 mg/dL (ref 6–23)
CO2: 29 mEq/L (ref 19–32)
Calcium: 8.5 mg/dL (ref 8.4–10.5)
Chloride: 103 mEq/L (ref 96–112)
Creatinine, Ser: 0.53 mg/dL (ref 0.50–1.10)
GFR calc Af Amer: >90 mL/min (ref >90)
GFR calc non Af Amer: >90 mL/min (ref >90)
Glucose, Bld: 93 mg/dL (ref 70–99)
Potassium: 4.5 mEq/L (ref 3.7–5.3)
Sodium: 141 mEq/L (ref 137–147)

## 2013-09-10 SURGERY — INCISION AND DRAINAGE, ABSCESS
Anesthesia: General | Site: Axilla | Laterality: Left

## 2013-09-10 MED ORDER — VANCOMYCIN HCL IN DEXTROSE 750-5 MG/150ML-% IV SOLN
750.0000 mg | Freq: Two times a day (BID) | INTRAVENOUS | Status: DC
  Administered 2013-09-10 – 2013-09-12 (×4): 750 mg via INTRAVENOUS
  Filled 2013-09-10 (×6): qty 150

## 2013-09-10 MED ORDER — CHLORHEXIDINE GLUCONATE 4 % EX LIQD
1.0000 "application " | Freq: Once | CUTANEOUS | Status: AC
  Administered 2013-09-10: 1 via TOPICAL
  Filled 2013-09-10: qty 15

## 2013-09-10 MED ORDER — ONDANSETRON HCL 4 MG/2ML IJ SOLN
4.0000 mg | Freq: Once | INTRAMUSCULAR | Status: AC | PRN
  Administered 2013-09-10: 4 mg via INTRAVENOUS

## 2013-09-10 MED ORDER — EPHEDRINE SULFATE 50 MG/ML IJ SOLN
INTRAMUSCULAR | Status: AC
  Filled 2013-09-10: qty 1

## 2013-09-10 MED ORDER — ONDANSETRON HCL 4 MG/2ML IJ SOLN
4.0000 mg | Freq: Once | INTRAMUSCULAR | Status: AC
  Administered 2013-09-10: 4 mg via INTRAVENOUS

## 2013-09-10 MED ORDER — DULOXETINE HCL 30 MG PO CPEP
30.0000 mg | ORAL_CAPSULE | Freq: Every day | ORAL | Status: DC
  Administered 2013-09-10 – 2013-09-12 (×3): 30 mg via ORAL
  Filled 2013-09-10 (×3): qty 1

## 2013-09-10 MED ORDER — SODIUM CHLORIDE 0.9 % IR SOLN
Status: DC | PRN
  Administered 2013-09-10: 1000 mL

## 2013-09-10 MED ORDER — PROPOFOL 10 MG/ML IV BOLUS
INTRAVENOUS | Status: DC | PRN
  Administered 2013-09-10: 100 mg via INTRAVENOUS
  Administered 2013-09-10: 200 mg via INTRAVENOUS

## 2013-09-10 MED ORDER — FENTANYL CITRATE 0.05 MG/ML IJ SOLN
INTRAMUSCULAR | Status: AC
  Filled 2013-09-10: qty 2

## 2013-09-10 MED ORDER — HYDROMORPHONE HCL PF 1 MG/ML IJ SOLN
1.0000 mg | INTRAMUSCULAR | Status: DC | PRN
  Administered 2013-09-10 – 2013-09-12 (×16): 1 mg via INTRAVENOUS
  Filled 2013-09-10 (×17): qty 1

## 2013-09-10 MED ORDER — POTASSIUM CHLORIDE CRYS ER 20 MEQ PO TBCR
20.0000 meq | EXTENDED_RELEASE_TABLET | Freq: Two times a day (BID) | ORAL | Status: DC
  Administered 2013-09-10 – 2013-09-12 (×4): 20 meq via ORAL
  Filled 2013-09-10 (×4): qty 1

## 2013-09-10 MED ORDER — PROMETHAZINE HCL 25 MG/ML IJ SOLN: 12.5000 mg | Freq: Four times a day (QID) | INTRAMUSCULAR | Status: DC | PRN

## 2013-09-10 MED ORDER — MIDAZOLAM HCL 2 MG/2ML IJ SOLN
1.0000 mg | INTRAMUSCULAR | Status: AC | PRN
  Administered 2013-09-10 (×2): 1 mg via INTRAVENOUS
  Administered 2013-09-10: 2 mg via INTRAVENOUS
  Filled 2013-09-10: qty 2

## 2013-09-10 MED ORDER — PROPOFOL 10 MG/ML IV EMUL
INTRAVENOUS | Status: AC
  Filled 2013-09-10: qty 20

## 2013-09-10 MED ORDER — FENTANYL CITRATE 0.05 MG/ML IJ SOLN
25.0000 ug | INTRAMUSCULAR | Status: DC | PRN
  Administered 2013-09-10 (×4): 50 ug via INTRAVENOUS

## 2013-09-10 MED ORDER — LACTATED RINGERS IV SOLN
INTRAVENOUS | Status: DC
  Administered 2013-09-10 (×2): 1000 mL via INTRAVENOUS

## 2013-09-10 MED ORDER — GLYCOPYRROLATE 0.2 MG/ML IJ SOLN
INTRAMUSCULAR | Status: AC
  Filled 2013-09-10: qty 1

## 2013-09-10 MED ORDER — LIDOCAINE HCL (PF) 1 % IJ SOLN
INTRAMUSCULAR | Status: AC
  Filled 2013-09-10: qty 5

## 2013-09-10 MED ORDER — FENTANYL CITRATE 0.05 MG/ML IJ SOLN
INTRAMUSCULAR | Status: AC
Start: 2013-09-10 — End: 2013-09-10
  Filled 2013-09-10: qty 2

## 2013-09-10 MED ORDER — ALPRAZOLAM 1 MG PO TABS
1.0000 mg | ORAL_TABLET | Freq: Three times a day (TID) | ORAL | Status: DC
  Administered 2013-09-10 – 2013-09-12 (×6): 1 mg via ORAL
  Filled 2013-09-10 (×6): qty 1

## 2013-09-10 MED ORDER — GLYCOPYRROLATE 0.2 MG/ML IJ SOLN
0.2000 mg | Freq: Once | INTRAMUSCULAR | Status: AC
  Administered 2013-09-10: 0.2 mg via INTRAVENOUS

## 2013-09-10 MED ORDER — ONDANSETRON HCL 4 MG/2ML IJ SOLN
INTRAMUSCULAR | Status: AC
Start: 2013-09-10 — End: 2013-09-10
  Filled 2013-09-10: qty 2

## 2013-09-10 MED ORDER — PIPERACILLIN-TAZOBACTAM 3.375 G IVPB
3.3750 g | Freq: Three times a day (TID) | INTRAVENOUS | Status: DC
  Administered 2013-09-10 – 2013-09-12 (×5): 3.375 g via INTRAVENOUS
  Filled 2013-09-10 (×9): qty 50

## 2013-09-10 MED ORDER — ONDANSETRON HCL 4 MG/2ML IJ SOLN
INTRAMUSCULAR | Status: AC
  Filled 2013-09-10: qty 2

## 2013-09-10 MED ORDER — LACTATED RINGERS IV SOLN
INTRAVENOUS | Status: DC
  Administered 2013-09-10 – 2013-09-11 (×2): via INTRAVENOUS

## 2013-09-10 MED ORDER — FENTANYL CITRATE 0.05 MG/ML IJ SOLN
INTRAMUSCULAR | Status: DC | PRN
  Administered 2013-09-10 (×2): 50 ug via INTRAVENOUS
  Administered 2013-09-10: 150 ug via INTRAVENOUS

## 2013-09-10 MED ORDER — OXYCODONE HCL 5 MG PO TABS
5.0000 mg | ORAL_TABLET | ORAL | Status: DC | PRN
  Administered 2013-09-10 – 2013-09-12 (×8): 5 mg via ORAL
  Filled 2013-09-10 (×9): qty 1

## 2013-09-10 MED ORDER — LIDOCAINE HCL (CARDIAC) 10 MG/ML IV SOLN
INTRAVENOUS | Status: DC | PRN
  Administered 2013-09-10: 20 mg via INTRAVENOUS

## 2013-09-10 MED ORDER — MIDAZOLAM HCL 2 MG/2ML IJ SOLN
INTRAMUSCULAR | Status: AC
  Filled 2013-09-10: qty 2

## 2013-09-10 MED ORDER — TEMAZEPAM 15 MG PO CAPS
30.0000 mg | ORAL_CAPSULE | Freq: Every day | ORAL | Status: DC
  Administered 2013-09-10 – 2013-09-11 (×2): 30 mg via ORAL
  Filled 2013-09-10 (×2): qty 2

## 2013-09-10 MED ORDER — FENTANYL CITRATE 0.05 MG/ML IJ SOLN
INTRAMUSCULAR | Status: AC
  Filled 2013-09-10: qty 5

## 2013-09-10 MED ORDER — SODIUM CHLORIDE 0.9 % IJ SOLN
INTRAMUSCULAR | Status: AC
  Filled 2013-09-10: qty 10

## 2013-09-10 MED ORDER — FENTANYL CITRATE 0.05 MG/ML IJ SOLN
25.0000 ug | INTRAMUSCULAR | Status: AC
  Administered 2013-09-10 (×2): 25 ug via INTRAVENOUS

## 2013-09-10 MED ORDER — ONDANSETRON HCL 4 MG/2ML IJ SOLN: 4.0000 mg | Freq: Three times a day (TID) | INTRAMUSCULAR | Status: DC | PRN

## 2013-09-10 MED ORDER — SEVOFLURANE IN SOLN
RESPIRATORY_TRACT | Status: AC
  Filled 2013-09-10: qty 250

## 2013-09-10 MED ORDER — IBUPROFEN 800 MG PO TABS
800.0000 mg | ORAL_TABLET | Freq: Three times a day (TID) | ORAL | Status: DC | PRN
  Administered 2013-09-11 – 2013-09-12 (×2): 800 mg via ORAL
  Filled 2013-09-10 (×2): qty 1

## 2013-09-10 SURGICAL SUPPLY — 34 items
APPLIER CLIP 9.375 SM OPEN (CLIP) ×2
APR CLP SM 9.3 20 MLT OPN (CLIP) ×1
BAG HAMPER (MISCELLANEOUS) ×2 IMPLANT
BNDG CONFORM 2 STRL LF (GAUZE/BANDAGES/DRESSINGS) ×1 IMPLANT
CLIP APPLIE 9.375 SM OPEN (CLIP) IMPLANT
CLOTH BEACON ORANGE TIMEOUT ST (SAFETY) ×2 IMPLANT
COVER LIGHT HANDLE STERIS (MISCELLANEOUS) ×4 IMPLANT
ELECT REM PT RETURN 9FT ADLT (ELECTROSURGICAL) ×2
ELECTRODE REM PT RTRN 9FT ADLT (ELECTROSURGICAL) ×1 IMPLANT
GAUZE PACKING FOLDED 1IN STRL (GAUZE/BANDAGES/DRESSINGS) ×1 IMPLANT
GAUZE PACKING IODOFORM 1 (PACKING) ×1 IMPLANT
GAUZE SPONGE 4X4 12PLY STRL (GAUZE/BANDAGES/DRESSINGS) IMPLANT
GLOVE BIO SURGEON STRL SZ 6.5 (GLOVE) ×1 IMPLANT
GLOVE BIO SURGEON STRL SZ7 (GLOVE) ×1 IMPLANT
GLOVE BIOGEL M STRL SZ7.5 (GLOVE) ×2 IMPLANT
GLOVE EXAM NITRILE MD LF STRL (GLOVE) ×1 IMPLANT
GLOVE INDICATOR 7.0 STRL GRN (GLOVE) ×1 IMPLANT
GLOVE INDICATOR 7.5 STRL GRN (GLOVE) ×1 IMPLANT
GLOVE SURG SS PI 7.5 STRL IVOR (GLOVE) ×2 IMPLANT
GOWN STRL REUS W/TWL LRG LVL3 (GOWN DISPOSABLE) ×4 IMPLANT
KIT ROOM TURNOVER APOR (KITS) ×2 IMPLANT
MANIFOLD NEPTUNE II (INSTRUMENTS) ×2 IMPLANT
MARKER SKIN DUAL TIP RULER LAB (MISCELLANEOUS) ×2 IMPLANT
NS IRRIG 1000ML POUR BTL (IV SOLUTION) ×2 IMPLANT
PACK BASIC LIMB (CUSTOM PROCEDURE TRAY) IMPLANT
PACK MINOR (CUSTOM PROCEDURE TRAY) ×2 IMPLANT
PAD ABD 5X9 TENDERSORB (GAUZE/BANDAGES/DRESSINGS) IMPLANT
PAD ARMBOARD 7.5X6 YLW CONV (MISCELLANEOUS) ×2 IMPLANT
SET BASIN LINEN APH (SET/KITS/TRAYS/PACK) ×2 IMPLANT
SPONGE GAUZE 4X4 12PLY (GAUZE/BANDAGES/DRESSINGS) ×1 IMPLANT
SPONGE LAP 18X18 X RAY DECT (DISPOSABLE) ×1 IMPLANT
SWAB CULTURE LIQ STUART DBL (MISCELLANEOUS) ×1 IMPLANT
SYR BULB IRRIGATION 50ML (SYRINGE) ×2 IMPLANT
TUBE ANAEROBIC PORT A CUL  W/M (MISCELLANEOUS) ×1 IMPLANT

## 2013-09-10 NOTE — Transfer of Care (Addendum)
Immediate Anesthesia Transfer of Care Note  Patient: Tonya Harmon  Procedure(s) Performed: Procedure(s): INCISION AND DRAINAGE ABSCESS, left breast/axilla (Bilateral)  Patient Location: PACU  Anesthesia Type:General  Level of Consciousness: awake and patient cooperative  Airway & Oxygen Therapy: Spontaneous breathing Post-op Assessment: Report given to PACU RN, Post -op Vital signs reviewed and stable and Patient moving all extremities  Post vital signs: Reviewed and stable  Complications: No apparent anesthesia complications

## 2013-09-10 NOTE — Anesthesia Procedure Notes (Signed)
Procedure Name: LMA Insertion Date/Time: 09/10/2013 11:46 AM Performed by: Franco NonesYATES, Mohsen Odenthal S Pre-anesthesia Checklist: Patient identified, Patient being monitored, Emergency Drugs available, Timeout performed and Suction available Patient Re-evaluated:Patient Re-evaluated prior to inductionOxygen Delivery Method: Circle System Utilized Preoxygenation: Pre-oxygenation with 100% oxygen Intubation Type: IV induction Ventilation: Mask ventilation without difficulty LMA: LMA inserted LMA Size: 3.0 Number of attempts: 1 Placement Confirmation: positive ETCO2 and breath sounds checked- equal and bilateral Tube secured with: Tape

## 2013-09-10 NOTE — Progress Notes (Signed)
ANTIBIOTIC CONSULT NOTE - follow up  Pharmacy Consult for Vancomycin and Zosyn Indication: wound infection  Allergies  Allergen Reactions  . Clonazepam Other (See Comments)    Homicidal thoughts  . Tramadol Itching and Other (See Comments)    Really bad itching, seizures Pt reports being able to tolerate morphine, dilaudid, percocet, vicodin with no problems noted in the past.  . Flexeril [Cyclobenzaprine Hcl] Other (See Comments)    'really bad muscle spasms'  . Naproxen Nausea And Vomiting and Other (See Comments)    Bad abdominal pain Pt can take ibuprofen  . Prednisone     Hallucinations,  Anger   . Trazodone And Nefazodone Other (See Comments)    Night terror  . Cefixime Hives  . Vistaril [Hydroxyzine Hcl] Other (See Comments)    'makes me really edgy'   Patient Measurements: Height: 5\' 5"  (165.1 cm) Weight: 124 lb 6.4 oz (56.427 kg) IBW/kg (Calculated) : 57  Vital Signs: Temp: 98.1 F (36.7 C) (07/10 1341) Temp src: Oral (07/10 1341) BP: 94/55 mmHg (07/10 1341) Pulse Rate: 66 (07/10 1341) Intake/Output from previous day: 07/09 0701 - 07/10 0700 In: 2418.8 [P.O.:240; I.V.:1728.8; IV Piggyback:450] Out: 3700 [Urine:3700] Intake/Output from this shift: Total I/O In: 930 [P.O.:30; I.V.:900] Out: 715 [Urine:700; Blood:15]  Labs:  Recent Labs  09/08/13 0513 09/09/13 0620 09/10/13 0529  WBC 8.0 9.6  --   HGB 9.3* 9.7*  --   PLT 232 272  --   CREATININE 0.47* 0.61 0.53   Estimated Creatinine Clearance: 96.5 ml/min (by C-G formula based on Cr of 0.53). No results found for this basename: VANCOTROUGH, Leodis Binet, VANCORANDOM, GENTTROUGH, GENTPEAK, GENTRANDOM, TOBRATROUGH, TOBRAPEAK, TOBRARND, AMIKACINPEAK, AMIKACINTROU, AMIKACIN,  in the last 72 hours   Microbiology: Recent Results (from the past 720 hour(s))  SURGICAL PCR SCREEN     Status: None   Collection Time    09/09/13  9:40 PM      Result Value Ref Range Status   MRSA, PCR NEGATIVE  NEGATIVE Final    Staphylococcus aureus NEGATIVE  NEGATIVE Final   Comment:            The Xpert SA Assay (FDA     approved for NASAL specimens     in patients over 59 years of age),     is one component of     a comprehensive surveillance     program.  Test performance has     been validated by [employer] for patients greater     than or equal to 63 year old.     It is not intended     to diagnose infection nor to     guide or monitor treatment.   Medical History: Past Medical History  Diagnosis Date  . Bipolar 1 disorder   . Depression   . Anxiety   . Bipolar disorder   . PONV (postoperative nausea and vomiting)   . Headache(784.0)   . GERD (gastroesophageal reflux disease)     pregnancy related-rolaids prn   Vancomycin 7/7 >> Zosyn 7/8 >>  Assessment: 23yo female admitted with wound infection. Pt has good renal fxn.  Pt is afebrile with normal WBC.   Estimated Creatinine Clearance: 96.5 ml/min (by C-G formula based on Cr of 0.53).  S/P I&D of left axillary abscess today.  Goal of Therapy:  Vancomycin trough level 10-15 mcg/ml  Plan:  Continue Vancomycin 750mg  IV q12hrs Check trough at steady state Continue Zosyn 3.375gm IV  q8h, each dose over 4 hrs Monitor labs, renal fxn, and cultures Deescalate / transition to PO antibiotics when appropriate  Valrie HartHall, Rocklyn Mayberry A 09/10/2013,1:52 PM

## 2013-09-10 NOTE — Addendum Note (Signed)
Addendum created 09/10/13 1301 by Franco Noneseresa S Laylonie Marzec, CRNA   Modules edited: Anesthesia Events

## 2013-09-10 NOTE — Anesthesia Preprocedure Evaluation (Signed)
Anesthesia Evaluation  Patient identified by MRN, date of birth, ID band Patient awake    Reviewed: Allergy & Precautions, H&P , NPO status , Patient's Chart, lab work & pertinent test results  History of Anesthesia Complications (+) PONV and history of anesthetic complications  Airway Mallampati: I TM Distance: >3 FB Neck ROM: Full    Dental  (+) Teeth Intact   Pulmonary neg pulmonary ROS,  breath sounds clear to auscultation        Cardiovascular negative cardio ROS  Rhythm:Regular Rate:Normal     Neuro/Psych  Headaches, PSYCHIATRIC DISORDERS Anxiety Depression Bipolar Disorder    GI/Hepatic Medicated and Controlled,  Endo/Other    Renal/GU      Musculoskeletal   Abdominal   Peds  Hematology   Anesthesia Other Findings   Reproductive/Obstetrics                           Anesthesia Physical Anesthesia Plan  ASA: II  Anesthesia Plan: General   Post-op Pain Management:    Induction: Intravenous  Airway Management Planned: LMA  Additional Equipment:   Intra-op Plan:   Post-operative Plan: Extubation in OR  Informed Consent: I have reviewed the patients History and Physical, chart, labs and discussed the procedure including the risks, benefits and alternatives for the proposed anesthesia with the patient or authorized representative who has indicated his/her understanding and acceptance.     Plan Discussed with:   Anesthesia Plan Comments:         Anesthesia Quick Evaluation

## 2013-09-10 NOTE — Progress Notes (Signed)
  Tonya Harmon ZOX:[id number] DOB: 06/09/1990 DOA: 09/07/2013 PCP: Milana ObeyKNOWLTON,STEPHEN D, MD   Subjective: This lady was admitted with left exerting cellulitis/abscess with possible underlying mass. She continues to have pain. She is on intravenous vancomycin and Zosyn. Surgical consultation is appreciated.           Physical Exam: Blood pressure 97/58, pulse 85, temperature 99.1 F (37.3 C), temperature source Oral, resp. rate 16, height 5\' 5"  (1.651 m), weight 56.427 kg (124 lb 6.4 oz), SpO2 99.00%, not currently breastfeeding. Nontoxic or septic. She still continues to have the cellulitis, the abscess seems to be somewhat larger.   Investigations:  Recent Results (from the past 240 hour(s))  SURGICAL PCR SCREEN     Status: None   Collection Time    09/09/13  9:40 PM      Result Value Ref Range Status   MRSA, PCR NEGATIVE  NEGATIVE Final   Staphylococcus aureus NEGATIVE  NEGATIVE Final   Comment:            The Xpert SA Assay (FDA     approved for NASAL specimens     in patients over 25 years of age),     is one component of     a comprehensive surveillance     program.  Test performance has     been validated by [facility] for patients greater     than or equal to 431 year old.     It is not intended     to diagnose infection nor to     guide or monitor treatment.     Basic Metabolic Panel:  Recent Labs  81/19/1405/11/16 0620 09/10/13 0529  NA 141 141  K 3.3* 4.5  CL 102 103  CO2 30 29  GLUCOSE 91 93  BUN 7 7  CREATININE 0.61 0.53  CALCIUM 8.6 8.5   Liver Function Tests:  Recent Labs  09/07/13 1342 09/08/13 0513  AST 14 12  ALT 15 11  ALKPHOS 137* 123*  BILITOT 0.4 0.2*  PROT 7.2 5.9*  ALBUMIN 3.3* 2.5*     CBC:  Recent Labs  09/07/13 1342 09/08/13 0513 09/09/13 0620  WBC 9.6 8.0 9.6  NEUTROABS 7.9*  --   --   HGB 10.3* 9.3* 9.7*  HCT 30.8* 27.4* 28.5*  MCV 89.0 89.3 89.1  PLT 250 232 272    No results  found.    Medications: I have reviewed the patient's current medications.  Impression: 1. Left axillary cellulitis with abscess. 2. Anxiety and depression.     Plan: 1. Continue with intravenous vancomycin and Zosyn. 2. For incision and drainage today.  Consultants:  Surgical consultation   Procedures:  None.   Antibiotics:  Intravenous vancomycin and intravenous Zosyn.                   Code Status: Full code.  Family Communication: I discussed the plan with the patient's family and her at the bedside.   Disposition Plan: Home when medically stable.  Time spent: 15 minutes.   LOS: 3 days   Abid Bolla C   09/10/2013, 9:15 AM

## 2013-09-10 NOTE — Op Note (Signed)
Patient:  Tonya Harmon  DOB:  10/28/1990  MRN:  [id number]   Preop Diagnosis:  Left axillary abscess  Postop Diagnosis:  Same  Procedure:  Incision and drainage of left axillary abscess  Surgeon:  Franky MachoMark Adar Rase, M.D.  Anes:  General  Indications:  Patient is a 23 year old white female presents with worsening pain and swelling in the left axilla. It is along the inferior aspect of the left axilla towards the breast. Patient now comes the operating room for incision and drainage of left axillary abscess. The risks and benefits of the procedure including bleeding and infection were fully explained to the patient, who gave informed consent.  Procedure note:  The patient is placed the supine position. After general anesthesia was administered, the left axilla was prepped and draped using usual sterile technique with DuraPrep. Surgical site confirmation was performed.  Incision was made over the indurated area of the left axilla that was in chair in and inferiorly positioned in the left axilla. It extends into the upper, outer quadrant of left breast. Face amount of indurated soft tissue was found. A small pus pocket was found which was higher up in the left axilla. Aerobic and anaerobic cultures were taken and sent to microbiology. The wound was irrigated normal saline. Any necrotic tissue was debrided using a curette. A bleeding was controlled using small clips and Bovie electrocautery. 1 inch iodoform new gauze was then inserted into the wound. Dry sterile dressing was then applied.  All tape and needle counts were correct the end of the procedure. Patient was awakened and transferred to PACU condition.  Complications:  none  EBL:  minimal  Specimen:  Cultures of left axillary abcess

## 2013-09-10 NOTE — Addendum Note (Signed)
Addendum created 09/10/13 1727 by Franco Noneseresa S Leiliana Foody, CRNA   Modules edited: Notes Section   Notes Section:  File: [id number]

## 2013-09-10 NOTE — Anesthesia Postprocedure Evaluation (Signed)
  Anesthesia Post-op Note  Patient: Tonya Harmon  Procedure(s) Performed: Procedure(s): INCISION AND DRAINAGE ABSCESS, left breast/axilla (Bilateral)  Patient Location: PACU  Anesthesia Type:General  Level of Consciousness: awake and unresponsive  Airway and Oxygen Therapy: Patient Spontanous Breathing  Post-op Pain: moderate  Post-op Assessment: Post-op Vital signs reviewed, Patient's Cardiovascular Status Stable and Respiratory Function Stable  Post-op Vital Signs: Reviewed and stable    Complications: No apparent anesthesia complications

## 2013-09-10 NOTE — Anesthesia Postprocedure Evaluation (Signed)
  Anesthesia Post-op Note  Patient: Tonya Harmon  Procedure(s) Performed: Procedure(s): INCISION AND DRAINAGE ABSCESS, left breast/axilla (Left)  Patient Location: Nursing Unit  Anesthesia Type:General  Level of Consciousness: awake and patient cooperative  Airway and Oxygen Therapy: Patient Spontanous Breathing  Post-op Pain: moderate  Post-op Assessment: Post-op Vital signs reviewed, Patient's Cardiovascular Status Stable, Respiratory Function Stable, Patent Airway and No signs of Nausea or vomiting  Post-op Vital Signs: Reviewed and stable  Last Vitals:  Filed Vitals:   09/10/13 1620  BP: 107/57  Pulse: 91  Temp: 37 C  Resp: 18    Complications: No apparent anesthesia complications

## 2013-09-11 LAB — IRON AND TIBC
Iron: 58 ug/dL (ref 42–135)
Saturation Ratios: 28 % (ref 20–55)
TIBC: 206 ug/dL — ABNORMAL LOW (ref 250–470)
UIBC: 148 ug/dL (ref 125–400)

## 2013-09-11 LAB — BASIC METABOLIC PANEL
Anion gap: 8 (ref 5–15)
BUN: 6 mg/dL (ref 6–23)
CO2: 30 mEq/L (ref 19–32)
Calcium: 8.8 mg/dL (ref 8.4–10.5)
Chloride: 100 mEq/L (ref 96–112)
Creatinine, Ser: 0.62 mg/dL (ref 0.50–1.10)
GFR calc Af Amer: >90 mL/min (ref >90)
GFR calc non Af Amer: >90 mL/min (ref >90)
Glucose, Bld: 104 mg/dL — ABNORMAL HIGH (ref 70–99)
Potassium: 4.2 mEq/L (ref 3.7–5.3)
Sodium: 138 mEq/L (ref 137–147)

## 2013-09-11 LAB — RETICULOCYTES
RBC.: 3.03 MIL/uL — ABNORMAL LOW (ref 3.87–5.11)
Retic Count, Absolute: 45.5 10*3/uL (ref 19.0–186.0)
Retic Ct Pct: 1.5 % (ref 0.4–3.1)

## 2013-09-11 LAB — FERRITIN: Ferritin: 185 ng/mL (ref 10–291)

## 2013-09-11 LAB — CBC
HCT: 27.6 % — ABNORMAL LOW (ref 36.0–46.0)
Hemoglobin: 9.1 g/dL — ABNORMAL LOW (ref 12.0–15.0)
MCH: 29.5 pg (ref 26.0–34.0)
MCHC: 33 g/dL (ref 30.0–36.0)
MCV: 89.6 fL (ref 78.0–100.0)
Platelets: 347 10*3/uL (ref 150–400)
RBC: 3.08 MIL/uL — ABNORMAL LOW (ref 3.87–5.11)
RDW: 13.7 % (ref 11.5–15.5)
WBC: 6.9 10*3/uL (ref 4.0–10.5)

## 2013-09-11 LAB — VITAMIN B12: Vitamin B-12: 1441 pg/mL — ABNORMAL HIGH (ref 211–911)

## 2013-09-11 LAB — FOLATE: Folate: 15.1 ng/mL

## 2013-09-11 MED ORDER — HYDROGEN PEROXIDE 3 % EX SOLN
CUTANEOUS | Status: AC
  Filled 2013-09-11: qty 480

## 2013-09-11 NOTE — Progress Notes (Signed)
NAMMarland Kitchen:  Tonya Harmon, Tonya Harmon      ACCOUNT NO.:  [id number]  MEDICAL RECORD NO.:  [id number]  LOCATION:  A309                          FACILITY:  [facility]  PHYSICIAN:  Melvyn Novasichard Michael Janisa Labus, MDDATE OF BIRTH:  Dec 13, 1990  DATE OF PROCEDURE:  09/11/2013 DATE OF DISCHARGE:                                PROGRESS NOTE   HISTORY OF PRESENT ILLNESS:  A 23 year old white female status post I and D of left axillary abscess yesterday, currently controlled on vanc and Zosyn.  She is currently afebrile.  No signs of bacteremia, septicemia.  The patient fairly comfortable.  PHYSICAL EXAMINATION:  LUNGS:  Clear.  No rales, wheeze, or rhonchi. HEART:  Regular rhythm.  No murmurs, gallops, or rubs.  Eating well.  She likewise has anemia, hemoglobin 9.1, we will do an anemia panel today with CBC in a.m.  We will consider reticulocyte count if abnormal.  Continue vanc and Zosyn strategy as per surgery for timing of discharge.     Melvyn Novasichard Michael Ketrick Matney, MD     RMD/MEDQ  D:  09/11/2013  T:  09/11/2013  Job:  [id number]

## 2013-09-11 NOTE — Progress Notes (Signed)
[id number] 

## 2013-09-11 NOTE — Addendum Note (Signed)
Addendum created 09/11/13 1244 by Franco Noneseresa S Refugio Vandevoorde, CRNA   Modules edited: Notes Section   Notes Section:  File: [id number]

## 2013-09-11 NOTE — Progress Notes (Signed)
1 Day Post-Op  Subjective: Moderate incisional pain.  Objective: Vital signs in last 24 hours: Temp:  [98.1 F (36.7 C)-98.8 F (37.1 C)] 98.4 F (36.9 C) (07/11 0427) Pulse Rate:  [66-94] 81 (07/11 0427) Resp:  [10-22] 18 (07/11 0427) BP: (94-150)/(40-74) 94/61 mmHg (07/11 0427) SpO2:  [94 %-100 %] 98 % (07/11 0427) Last BM Date: 09/09/13  Intake/Output from previous day: 07/10 0701 - 07/11 0700 In: 2813.3 [P.O.:710; I.V.:1703.3; IV Piggyback:400] Out: 3515 [Urine:3500; Blood:15] Intake/Output this shift: Total I/O In: 360 [P.O.:360] Out: -   General appearance: alert, cooperative and no distress Skin: Left axillary wound healing well. Packing removed.  Lab Results:   Recent Labs  09/09/13 0620 09/11/13 0459  WBC 9.6 6.9  HGB 9.7* 9.1*  HCT 28.5* 27.6*  PLT 272 347   BMET  Recent Labs  09/10/13 0529 09/11/13 0459  NA 141 138  K 4.5 4.2  CL 103 100  CO2 29 30  GLUCOSE 93 104*  BUN 7 6  CREATININE 0.53 0.62  CALCIUM 8.5 8.8   PT/INR No results found for this basename: LABPROT, INR,  in the last 72 hours  Studies/Results: No results found.  Anti-infectives: Anti-infectives   Start     Dose/Rate Route Frequency Ordered Stop   09/10/13 1800  piperacillin-tazobactam (ZOSYN) IVPB 3.375 g     3.375 g 12.5 mL/hr over 240 Minutes Intravenous Every 8 hours 09/10/13 1340     09/10/13 1500  vancomycin (VANCOCIN) IVPB 750 mg/150 ml premix     750 mg 150 mL/hr over 60 Minutes Intravenous Every 12 hours 09/10/13 1340     09/08/13 1000  piperacillin-tazobactam (ZOSYN) IVPB 3.375 g  Status:  Discontinued     3.375 g 12.5 mL/hr over 240 Minutes Intravenous Every 8 hours 09/08/13 0905 09/10/13 1340   09/08/13 0200  vancomycin (VANCOCIN) IVPB 750 mg/150 ml premix  Status:  Discontinued     750 mg 150 mL/hr over 60 Minutes Intravenous Every 12 hours 09/07/13 1716 09/10/13 1340   09/07/13 1345  vancomycin (VANCOCIN) IVPB 1000 mg/200 mL premix     1,000 mg 200  mL/hr over 60 Minutes Intravenous STAT 09/07/13 1322 09/07/13 1550      Assessment/Plan: s/p Procedure(s): INCISION AND DRAINAGE ABSCESS, left breast/axilla Impression: Stable on postoperative day one. Final culture results pending. Plan: We'll switch to Q-tip/peroxide dressing changes every shift. Anticipate discharge tomorrow in a.m.  LOS: 4 days    Tonya Harmon A 09/11/2013

## 2013-09-11 NOTE — Anesthesia Postprocedure Evaluation (Signed)
  Anesthesia Post-op Note  Patient: Tonya ManisElizabeth Korff  Procedure(s) Performed: Procedure(s): INCISION AND DRAINAGE ABSCESS, left breast/axilla (Left)  Patient Location: Nursing Unit  Anesthesia Type:General  Level of Consciousness: awake and patient cooperative  Airway and Oxygen Therapy: Patient Spontanous Breathing  Post-op Pain: none  Post-op Assessment: Post-op Vital signs reviewed, Patient's Cardiovascular Status Stable, Respiratory Function Stable and No signs of Nausea or vomiting  Post-op Vital Signs: Reviewed and stable  Last Vitals:  Filed Vitals:   09/11/13 0427  BP: 94/61  Pulse: 81  Temp: 36.9 C  Resp: 18    Complications: No apparent anesthesia complications

## 2013-09-12 LAB — CBC WITH DIFFERENTIAL/PLATELET
Basophils Absolute: 0 10*3/uL (ref 0.0–0.1)
Basophils Relative: 0 % (ref 0–1)
Eosinophils Absolute: 0.2 10*3/uL (ref 0.0–0.7)
Eosinophils Relative: 4 % (ref 0–5)
HCT: 26.9 % — ABNORMAL LOW (ref 36.0–46.0)
Hemoglobin: 8.8 g/dL — ABNORMAL LOW (ref 12.0–15.0)
Lymphocytes Relative: 30 % (ref 12–46)
Lymphs Abs: 1.5 10*3/uL (ref 0.7–4.0)
MCH: 29.6 pg (ref 26.0–34.0)
MCHC: 32.7 g/dL (ref 30.0–36.0)
MCV: 90.6 fL (ref 78.0–100.0)
Monocytes Absolute: 0.3 10*3/uL (ref 0.1–1.0)
Monocytes Relative: 5 % (ref 3–12)
Neutro Abs: 3 10*3/uL (ref 1.7–7.7)
Neutrophils Relative %: 61 % (ref 43–77)
Platelets: 398 10*3/uL (ref 150–400)
RBC: 2.97 MIL/uL — ABNORMAL LOW (ref 3.87–5.11)
RDW: 13.7 % (ref 11.5–15.5)
WBC: 4.9 10*3/uL (ref 4.0–10.5)

## 2013-09-12 LAB — BASIC METABOLIC PANEL
Anion gap: 8 (ref 5–15)
BUN: 10 mg/dL (ref 6–23)
CO2: 31 mEq/L (ref 19–32)
Calcium: 8.9 mg/dL (ref 8.4–10.5)
Chloride: 101 mEq/L (ref 96–112)
Creatinine, Ser: 0.6 mg/dL (ref 0.50–1.10)
GFR calc Af Amer: >90 mL/min (ref >90)
GFR calc non Af Amer: >90 mL/min (ref >90)
Glucose, Bld: 90 mg/dL (ref 70–99)
Potassium: 4.4 mEq/L (ref 3.7–5.3)
Sodium: 140 mEq/L (ref 137–147)

## 2013-09-12 MED ORDER — OXYCODONE-ACETAMINOPHEN 5-325 MG PO TABS: 1.0000 | ORAL_TABLET | ORAL | Status: DC | PRN

## 2013-09-12 NOTE — Discharge Summary (Signed)
Physician Discharge Summary  Patient ID: Tonya Harmon MRN: [id number] DOB/AGE: 25/07/1990 25 y.o.  Admit date: 09/07/2013 Discharge date: 09/12/2013  Admission Diagnoses: Cellulitis, left axilla/left breast  Discharge Diagnoses: Cellulitis with small abscess, left axilla Active Problems:   Anxiety   Depression   Axillary mass   Cellulitis of axilla, left   Cellulitis   Discharged Condition: good  Hospital Course: Patient is a 23 year old white female who was admitted to the hospital for treatment of a left axillary/breast cellulitis. There is a question of whether she had an abscess in this region. It is difficult to a certain the source of the cellulitis. She stated that she had a small pimple higher up in the left axilla earlier. She failed outpatient medical therapy. Surgery was consulted and the patient subsequently underwent an I&D of the left axillary abscess on 09/10/2013. Minimal purulent fluid was found. The tissue is mostly indurated. It seemed to track higher up into the left axilla. I suspect this was from the left axillary process versus left breast process. Final cultures are still pending. She is being discharged home on postoperative day in good and improving condition. She is to clean her wound twice a day with a Q-tip and peroxide.  Treatments: surgery: Incision and drainage of left axillary abscess on 09/10/2013  Discharge Exam: Blood pressure 87/44, pulse 81, temperature 98.1 F (36.7 C), temperature source Oral, resp. rate 18, height 5\' 5"  (1.651 m), weight 56.427 kg (124 lb 6.4 oz), SpO2 98.00%, not currently breastfeeding. General appearance: alert, cooperative and no distress Resp: clear to auscultation bilaterally Cardio: regular rate and rhythm, S1, S2 normal, no murmur, click, rub or gallop Skin: Left axillary wound healing well with serosanguineous drainage.  Disposition: 01-Home or Self Care     Medication List    STOP taking these medications        HYDROcodone-acetaminophen 5-325 MG per tablet  Commonly known as:  NORCO/VICODIN      TAKE these medications       ALPRAZolam 1 MG tablet  Commonly known as:  XANAX  Take 1 mg by mouth 3 (three) times daily. Patient takes one tablet in the morning and 2 at night     doxycycline 100 MG capsule  Commonly known as:  VIBRAMYCIN  Take 1 capsule (100 mg total) by mouth 2 (two) times daily. For 10 days     DULoxetine 30 MG capsule  Commonly known as:  CYMBALTA  Take 30 mg by mouth daily.     ibuprofen 800 MG tablet  Commonly known as:  ADVIL,MOTRIN  Take 800 mg by mouth every 8 (eight) hours as needed for moderate pain.     NEXPLANON 68 MG Impl implant  Generic drug:  etonogestrel  Inject 1 each into the skin once.     oxyCODONE-acetaminophen 5-325 MG per tablet  Commonly known as:  PERCOCET/ROXICET  Take 1-2 tablets by mouth every 4 (four) hours as needed.     SUMAtriptan 25 MG tablet  Commonly known as:  IMITREX  Take 25 mg by mouth every 2 (two) hours as needed for migraine or headache. May repeat in 2 hours if headache persists or recurs.     temazepam 30 MG capsule  Commonly known as:  RESTORIL  Take 30 mg by mouth at bedtime.           Follow-up Information   Follow up with Dalia HeadingJENKINS,Kinzley Savell A, MD. Schedule an appointment as soon as possible for a visit on 09/16/2013.  Specialty:  General Surgery   Contact information:   [address] [place] [address] [phone]       Signed: Franky Macho A 09/12/2013, 10:45 AM

## 2013-09-12 NOTE — Discharge Instructions (Signed)
Clean wound with q-tip and peroxide twice a day.    Abscess An abscess is an infected area that contains a collection of pus and debris.It can occur in almost any part of the body. An abscess is also known as a furuncle or boil. CAUSES  An abscess occurs when tissue gets infected. This can occur from blockage of oil or sweat glands, infection of hair follicles, or a minor injury to the skin. As the body tries to fight the infection, pus collects in the area and creates pressure under the skin. This pressure causes pain. People with weakened immune systems have difficulty fighting infections and get certain abscesses more often.  SYMPTOMS Usually an abscess develops on the skin and becomes a painful mass that is red, warm, and tender. If the abscess forms under the skin, you may feel a moveable soft area under the skin. Some abscesses break open (rupture) on their own, but most will continue to get worse without care. The infection can spread deeper into the body and eventually into the bloodstream, causing you to feel ill.  DIAGNOSIS  Your caregiver will take your medical history and perform a physical exam. A sample of fluid may also be taken from the abscess to determine what is causing your infection. TREATMENT  Your caregiver may prescribe antibiotic medicines to fight the infection. However, taking antibiotics alone usually does not cure an abscess. Your caregiver may need to make a small cut (incision) in the abscess to drain the pus. In some cases, gauze is packed into the abscess to reduce pain and to continue draining the area. HOME CARE INSTRUCTIONS   Only take over-the-counter or prescription medicines for pain, discomfort, or fever as directed by your caregiver.  If you were prescribed antibiotics, take them as directed. Finish them even if you start to feel better.  If gauze is used, follow your caregiver's directions for changing the gauze.  To avoid spreading the  infection:  Keep your draining abscess covered with a bandage.  Wash your hands well.  Do not share personal care items, towels, or whirlpools with others.  Avoid skin contact with others.  Keep your skin and clothes clean around the abscess.  Keep all follow-up appointments as directed by your caregiver. SEEK MEDICAL CARE IF:   You have increased pain, swelling, redness, fluid drainage, or bleeding.  You have muscle aches, chills, or a general ill feeling.  You have a fever. MAKE SURE YOU:   Understand these instructions.  Will watch your condition.  Will get help right away if you are not doing well or get worse. Document Released: 11/28/2004 Document Revised: 08/20/2011 Document Reviewed: 05/03/2011 [facility] Patient Information 2015 [employer] [place] [employer], [place]. This information is not intended to replace advice given to you by your health care provider. Make sure you discuss any questions you have with your health care provider.

## 2013-09-12 NOTE — Progress Notes (Signed)
[id number] 

## 2013-09-12 NOTE — Plan of Care (Signed)
Problem: Discharge Progression Outcomes Goal: Other Discharge Outcomes/Goals Outcome: Completed/Met Date Met:  09/12/13 Dressing changed per order prior to discharge

## 2013-09-13 ENCOUNTER — Encounter (HOSPITAL_COMMUNITY): Payer: Self-pay | Admitting: General Surgery

## 2013-09-13 LAB — CULTURE, ROUTINE-ABSCESS: Gram Stain: NONE SEEN

## 2013-09-13 NOTE — Progress Notes (Signed)
UR chart review completed.  

## 2013-09-13 NOTE — Progress Notes (Signed)
NAME:  Tonya Harmon, Tonya Harmon      ACCOUNT NO.:  [id number]  MEDICAL RECORD NO.:  [id number]  LOCATION:                                 FACILITY:  PHYSICIAN:  Melvyn Novasichard Michael Kadence Mikkelson, MDDATE OF BIRTH:  1990/06/13  DATE OF PROCEDURE:  09/12/2013 DATE OF DISCHARGE:  09/12/2013                                PROGRESS NOTE   SUBJECTIVE:  A 23 year old white female with status post I and D for axillary abscess, currently on vanc and Zosyn.  Afebrile.  Likewise had anemia.  Today, her hemoglobin was 8.8, yesterday 9.1.  No evidence of GI bleed overtly.  Did anemia profile yesterday revealing normal iron, normal saturations, ferritin as well as folate, not sure the etiology of her anemia.  We will perform reticulocyte count, with CBC today and primary care doctor.  We will pursue further workup for anemia.  PHYSICAL EXAMINATION:  LUNGS:  Clear.  No rales, wheeze, or rhonchi. HEART:  Regular rhythm.  No murmurs, gallops, or rubs. ABDOMEN:  Soft, nontender.  Bowel sounds normoactive.     Melvyn Novasichard Michael Alaira Level, MD     RMD/MEDQ  D:  09/12/2013  T:  09/12/2013  Job:  [id number]

## 2013-09-15 LAB — ANAEROBIC CULTURE: Gram Stain: NONE SEEN

## 2014-01-03 ENCOUNTER — Encounter (HOSPITAL_COMMUNITY): Payer: Self-pay | Admitting: General Surgery

## 2014-05-31 ENCOUNTER — Ambulatory Visit: Payer: 59 | Admitting: Neurology

## 2014-06-08 ENCOUNTER — Encounter: Payer: Self-pay | Admitting: Neurology

## 2014-06-08 ENCOUNTER — Ambulatory Visit (INDEPENDENT_AMBULATORY_CARE_PROVIDER_SITE_OTHER): Payer: Medicaid Other | Admitting: Neurology

## 2014-06-08 VITALS — BP 119/65 | HR 85 | Ht 65.0 in | Wt 114.0 lb

## 2014-06-08 DIAGNOSIS — R569 Unspecified convulsions: Secondary | ICD-10-CM | POA: Insufficient documentation

## 2014-06-08 DIAGNOSIS — F419 Anxiety disorder, unspecified: Secondary | ICD-10-CM

## 2014-06-08 DIAGNOSIS — G43019 Migraine without aura, intractable, without status migrainosus: Secondary | ICD-10-CM

## 2014-06-08 HISTORY — DX: Migraine without aura, intractable, without status migrainosus: G43.019

## 2014-06-08 MED ORDER — TOPIRAMATE 25 MG PO TABS: ORAL_TABLET | ORAL | Status: DC

## 2014-06-08 NOTE — Patient Instructions (Signed)
 Topamax (topiramate) is a seizure medication that has an FDA approval for seizures and for migraine headache. Potential side effects of this medication include weight loss, cognitive slowing, tingling in the fingers and toes, and carbonated drinks will taste bad. If any significant side effects are noted on this drug, please contact our office.  Epilepsy Epilepsy is a disorder in which a person has repeated seizures over time. A seizure is a release of abnormal electrical activity in the brain. Seizures can cause a change in attention, behavior, or the ability to remain awake and alert (altered mental status). Seizures often involve uncontrollable shaking (convulsions).  Most people with epilepsy lead normal lives. However, people with epilepsy are at an increased risk of falls, accidents, and injuries. Therefore, it is important to begin treatment right away. CAUSES  Epilepsy has many possible causes. Anything that disturbs the normal pattern of brain cell activity can lead to seizures. This may include:   Head injury.  Birth trauma.  High fever as a child.  Stroke.  Bleeding into or around the brain.  Certain drugs.  Prolonged low oxygen, such as what occurs after CPR efforts.  Abnormal brain development.  Certain illnesses, such as meningitis, encephalitis (brain infection), malaria, and other infections.  An imbalance of nerve signaling chemicals (neurotransmitters).  SIGNS AND SYMPTOMS  The symptoms of a seizure can vary greatly from one person to another. Right before a seizure, you may have a warning (aura) that a seizure is about to occur. An aura may include the following symptoms:  Fear or anxiety.  Nausea.  Feeling like the room is spinning (vertigo).  Vision changes, such as seeing flashing lights or spots. Common symptoms during a seizure include:  Abnormal sensations, such as an abnormal smell or a bitter taste in the mouth.   Sudden, general body  stiffness.   Convulsions that involve rhythmic jerking of the face, arm, or leg on one or both sides.   Sudden change in consciousness.   Appearing to be awake but not responding.   Appearing to be asleep but cannot be awakened.   Grimacing, chewing, lip smacking, drooling, tongue biting, or loss of bowel or bladder control. After a seizure, you may feel sleepy for a while. DIAGNOSIS  Your health care provider will ask about your symptoms and take a medical history. Descriptions from any witnesses to your seizures will be very helpful in the diagnosis. A physical exam, including a detailed neurological exam, is necessary. Various tests may be done, such as:   An electroencephalogram (EEG). This is a painless test of your brain waves. In this test, a diagram is created of your brain waves. These diagrams can be interpreted by a specialist.  An MRI of the brain.   A CT scan of the brain.   A spinal tap (lumbar puncture, LP).  Blood tests to check for signs of infection or abnormal blood chemistry. TREATMENT  There is no cure for epilepsy, but it is generally treatable. Once epilepsy is diagnosed, it is important to begin treatment as soon as possible. For most people with epilepsy, seizures can be controlled with medicines. The following may also be used:  A pacemaker for the brain (vagus nerve stimulator) can be used for people with seizures that are not well controlled by medicine.  Surgery on the brain. For some people, epilepsy eventually goes away. HOME CARE INSTRUCTIONS   Follow your health care provider's recommendations on driving and safety in normal activities.  Get   enough rest. Lack of sleep can cause seizures.  Only take over-the-counter or prescription medicines as directed by your health care provider. Take any prescribed medicine exactly as directed.  Avoid any known triggers of your seizures.  Keep a seizure diary. Record what you recall about any  seizure, especially any possible trigger.   Make sure the people you live and work with know that you are prone to seizures. They should receive instructions on how to help you. In general, a witness to a seizure should:   Cushion your head and body.   Turn you on your side.   Avoid unnecessarily restraining you.   Not place anything inside your mouth.   Call for emergency medical help if there is any question about what has occurred.   Follow up with your health care provider as directed. You may need regular blood tests to monitor the levels of your medicine.  SEEK MEDICAL CARE IF:   You develop signs of infection or other illness. This might increase the risk of a seizure.   You seem to be having more frequent seizures.   Your seizure pattern is changing.  SEEK IMMEDIATE MEDICAL CARE IF:   You have a seizure that does not stop after a few moments.   You have a seizure that causes any difficulty in breathing.   You have a seizure that results in a very severe headache.   You have a seizure that leaves you with the inability to speak or use a part of your body.  Document Released: 02/18/2005 Document Revised: 12/09/2012 Document Reviewed: 09/30/2012 [employer] Patient Information 2015 [employer]. This information is not intended to replace advice given to you by your health care provider. Make sure you discuss any questions you have with your health care provider.  

## 2014-06-08 NOTE — Progress Notes (Signed)
Reason for visit: seizures  Referring physician: Dr. Rose PhiKnowlton  Tonya Harmon is a 24 y.o. female  History of present illness:  Ms. Tonya Harmon is a 24 year old right-handed white female with a history of seizures starting in 2005 associated with the use of Ultram. The patient stopped the medication, and she has not had any recurrence until 6 months ago. The patient had 2 seizures in 2005, and the patient has had several seizures over the last several months. The patient indicates that she will have no warning with the event, she will lose consciousness, jerk on all fours, and bite her tongue. The patient will have urinary incontinence, and occasional bowel incontinence. The patient may take up to 2 hours to fully recover from the seizure event. The patient last had a seizure on 05/05/2014. The patient still has been operating a motor vehicle off and on for short distance driving. The patient reports no prior history of head injury. The patient denies any family history of seizures. The patient reports some weakness in the hands, she has some occasional sharp pains in the back of her head, and some left arm tingling. She reports some slight imbalance issues. She has also had some memory loss since the seizure started 6 months ago. She has not received any workup for the seizures, she is not on any medications for the seizures. She is sent to this office for further evaluation.  The patient indicates that she does have frequent headaches, with headaches are essentially daily in nature. She indicates that she is on alprazolam for anxiety, she denies missing any doses prior to the onset of any of her seizures. She denies any use of illicit drugs.  Past Medical History  Diagnosis Date  . Bipolar 1 disorder   . Depression   . Anxiety   . Bipolar disorder   . PONV (postoperative nausea and vomiting)   . Headache(784.0)   . GERD (gastroesophageal reflux disease)     pregnancy  related-rolaids prn  . Seizures   . Back pain   . Sciatica   . Insomnia   . Common migraine with intractable migraine 06/08/2014    Past Surgical History  Procedure Laterality Date  . Cesarean section  2011,2008  . Cesarean section N/A 05/18/2012    Procedure: CESAREAN SECTION;  Surgeon: Tilda BurrowJohn V Ferguson, MD;  Location: [facility];  Service: Obstetrics;  Laterality: N/A;  . Incision and drainage abscess Left 09/10/2013    Procedure: INCISION AND DRAINAGE ABSCESS, left breast/axilla;  Surgeon: Dalia HeadingMark A Jenkins, MD;  Location: [facility];  Service: General;  Laterality: Left;    Family History  Problem Relation Age of Onset  . Diabetes Other   . Hypertension Other   . Cancer Other   . Coronary artery disease Other   . Diabetes Mother   . Fibromyalgia Mother   . Rheum arthritis Mother   . Tremor Maternal Grandmother   . Cancer Maternal Grandmother     breast, uterine  . Alzheimer's disease Maternal Grandmother   . Stroke Maternal Grandmother   . Coronary artery disease Maternal Grandfather   . Diabetes Maternal Grandfather   . Hypertension Maternal Grandfather   . Diabetes Paternal Grandmother   . Hypertension Paternal Grandmother   . Cancer Paternal Grandmother     breast  . Cancer Paternal Grandfather     pancreatic  . Hypertension Paternal Grandfather   . Seizures Neg Hx     Social history:  reports that she has never  smoked. She has never used smokeless tobacco. She reports that she does not drink alcohol or use illicit drugs.  Medications:  Prior to Admission medications   Medication Sig Start Date End Date Taking? Authorizing Provider  ALPRAZolam Prudy Feeler) 1 MG tablet Take 1 mg by mouth 3 (three) times daily. Patient takes one tablet in the morning and 2 at night   Yes Historical Provider, MD  CRANBERRY EXTRACT PO Take by mouth daily.   Yes Historical Provider, MD  doxycycline (VIBRAMYCIN) 100 MG capsule Take 1 capsule (100 mg total) by mouth 2 (two) times daily. For 10 days  09/03/13  Yes Tammi Triplett, PA-C  DULoxetine (CYMBALTA) 30 MG capsule Take 60 mg by mouth daily.    Yes Historical Provider, MD  etonogestrel (NEXPLANON) 68 MG IMPL implant Inject 1 each into the skin once.   Yes Historical Provider, MD  ibuprofen (ADVIL,MOTRIN) 800 MG tablet Take 800 mg by mouth every 8 (eight) hours as needed for moderate pain.   Yes Historical Provider, MD  indomethacin (INDOCIN) 50 MG capsule Take 50 mg by mouth 3 (three) times daily with meals.   Yes Historical Provider, MD  SUMAtriptan (IMITREX) 25 MG tablet Take 25 mg by mouth every 2 (two) hours as needed for migraine or headache. May repeat in 2 hours if headache persists or recurs.   Yes Historical Provider, MD  temazepam (RESTORIL) 30 MG capsule Take 30 mg by mouth at bedtime.   Yes Historical Provider, MD  zolpidem (AMBIEN) 5 MG tablet Take 5 mg by mouth at bedtime as needed for sleep.   Yes Historical Provider, MD      Allergies  Allergen Reactions  . Clonazepam Other (See Comments)    Homicidal thoughts  . Tramadol Itching and Other (See Comments)    Really bad itching, seizures Pt reports being able to tolerate morphine, dilaudid, percocet, vicodin with no problems noted in the past.  . Flexeril [Cyclobenzaprine Hcl] Other (See Comments)    'really bad muscle spasms'  . Naproxen Nausea And Vomiting and Other (See Comments)    Bad abdominal pain Pt can take ibuprofen  . Prednisone     Hallucinations,  Anger   . Trazodone And Nefazodone Other (See Comments)    Night terror  . Cefixime Hives  . Vistaril [Hydroxyzine Hcl] Other (See Comments)    'makes me really edgy'    ROS:  Out of a complete 14 system review of symptoms, the patient complains only of the following symptoms, and all other reviewed systems are negative.  Weight loss, fatigue Ringing in the ears Itching Anemia Feeling hot, increased thirst Joint pain, achy muscles Runny nose Memory loss, confusion, headache, weakness,  seizures Depression, anxiety, not enough sleep, decreased energy, disinterest in activities, racing thoughts Insomnia, restless legs  Blood pressure 119/65, pulse 85, height  (1.651 m), weight 114 lb (51.71 kg).  Physical Exam  General: The patient is alert and cooperative at the time of the examination.  Eyes: Pupils are equal, round, and reactive to light. Discs are flat bilaterally.  Neck: The neck is supple, no carotid bruits are noted.  Respiratory: The respiratory examination is clear.  Cardiovascular: The cardiovascular examination reveals a regular rate and rhythm, no obvious murmurs or rubs are noted.  Skin: Extremities are without significant edema.  Neurologic Exam  Mental status: The patient is alert and oriented x 3 at the time of the examination. The patient has apparent normal recent and remote memory, with an  apparently normal attention span and concentration ability.  Cranial nerves: Facial symmetry is present. There is good sensation of the face to pinprick and soft touch bilaterally. The strength of the facial muscles and the muscles to head turning and shoulder shrug are normal bilaterally. Speech is well enunciated, no aphasia or dysarthria is noted. Extraocular movements are full. Visual fields are full. The tongue is midline, and the patient has symmetric elevation of the soft palate. No obvious hearing deficits are noted.  Motor: The motor testing reveals 5 over 5 strength of all 4 extremities. Good symmetric motor tone is noted throughout.  Sensory: Sensory testing is intact to pinprick, soft touch, vibration sensation, and position sense on all 4 extremities. No evidence of extinction is noted.  Coordination: Cerebellar testing reveals good finger-nose-finger and heel-to-shin bilaterally.  Gait and station: Gait is normal. Tandem gait is normal. Romberg is negative. No drift is seen.  Reflexes: Deep tendon reflexes are symmetric and normal bilaterally.  Toes are downgoing bilaterally.   Assessment/Plan:  1. History of seizure-type events  2. Migraine headache  3. Anxiety and depression  The patient is not to operate a motor vehicle until further notice. We will recommend no driving for least 6 months after the last clinical seizure event. The patient will be started on Topamax for the migraine and for the seizures. She will begin at 25 mg twice daily for 2 weeks, then go to 50 mg twice daily. MRI of the brain will be ordered, with and without gadolinium enhancement. An EEG study will be done. The patient will follow-up in 4 months.  Marlan Palau MD 06/08/2014 7:37 PM  [facility] [address] [place], [place] [address]  Phone [phone] Fax [phone]

## 2014-06-09 ENCOUNTER — Other Ambulatory Visit: Payer: Medicaid Other

## 2014-06-13 ENCOUNTER — Encounter: Payer: Self-pay | Admitting: Neurology

## 2014-06-17 ENCOUNTER — Other Ambulatory Visit: Payer: Medicaid Other | Admitting: Radiology

## 2014-06-28 ENCOUNTER — Encounter: Payer: Self-pay | Admitting: Neurology

## 2014-10-10 ENCOUNTER — Telehealth: Payer: Self-pay | Admitting: *Deleted

## 2014-10-10 NOTE — Telephone Encounter (Signed)
I attempted to call pt and was not able to LM (VM full).  (regarding tests ordered).  (MRI and EEG not done).

## 2014-10-11 ENCOUNTER — Telehealth: Payer: Self-pay

## 2014-10-11 ENCOUNTER — Ambulatory Visit: Payer: Medicaid Other | Admitting: Adult Health

## 2014-10-11 NOTE — Telephone Encounter (Signed)
Noted. Thanks.

## 2014-10-11 NOTE — Telephone Encounter (Signed)
Tried to call patient voice mail full. I will try to call back later in the morning.

## 2014-10-11 NOTE — Telephone Encounter (Signed)
Patient NO -Showed her apt.

## 2014-12-27 ENCOUNTER — Other Ambulatory Visit: Payer: Self-pay | Admitting: Neurology

## 2015-10-04 ENCOUNTER — Encounter: Payer: Self-pay | Admitting: Adult Health

## 2015-10-04 ENCOUNTER — Ambulatory Visit (INDEPENDENT_AMBULATORY_CARE_PROVIDER_SITE_OTHER): Payer: Medicaid Other | Admitting: Adult Health

## 2015-10-04 ENCOUNTER — Other Ambulatory Visit (HOSPITAL_COMMUNITY)
Admission: RE | Admit: 2015-10-04 | Discharge: 2015-10-04 | Disposition: A | Discharge status: Home / Self Care | Payer: Medicaid Other | Source: Ambulatory Visit | Attending: Adult Health | Admitting: Adult Health

## 2015-10-04 VITALS — BP 104/60 | HR 80 | Ht 65.0 in | Wt 117.5 lb

## 2015-10-04 DIAGNOSIS — Z308 Encounter for other contraceptive management: Secondary | ICD-10-CM

## 2015-10-04 DIAGNOSIS — R102 Pelvic and perineal pain: Secondary | ICD-10-CM | POA: Insufficient documentation

## 2015-10-04 DIAGNOSIS — Z113 Encounter for screening for infections with a predominantly sexual mode of transmission: Secondary | ICD-10-CM | POA: Diagnosis present

## 2015-10-04 DIAGNOSIS — Z975 Presence of (intrauterine) contraceptive device: Secondary | ICD-10-CM | POA: Insufficient documentation

## 2015-10-04 DIAGNOSIS — Z01419 Encounter for gynecological examination (general) (routine) without abnormal findings: Secondary | ICD-10-CM | POA: Insufficient documentation

## 2015-10-04 DIAGNOSIS — Z3009 Encounter for other general counseling and advice on contraception: Secondary | ICD-10-CM

## 2015-10-04 HISTORY — DX: Presence of (intrauterine) contraceptive device: Z97.5

## 2015-10-04 HISTORY — DX: Pelvic and perineal pain: R10.2

## 2015-10-04 NOTE — Progress Notes (Signed)
Patient ID: Tonya Harmon, female   DOB: Aug 29, 1990, 25 y.o.   MRN: [id number] History of Present Illness: Tonya Harmon is a 25 year old white female, separated in for a well woman gyn exam and pap, has family planning medicaid.She has nexplanon that is past due to be removed and wants it out and start on OCs.She has had pain in right side on and off for 6 months.She said her husband cheated on her and has left.  PCP is Dr Sudie Bailey.   Current Medications, Allergies, Past Medical History, Past Surgical History, Family History and Social History were reviewed in [employer] record.     Review of Systems: Patient denies any headaches, hearing loss, fatigue, blurred vision, shortness of breath, chest pain,  problems with bowel movements, urination, or intercourse. No joint pain or mood swings. See HPI for positives.   Physical Exam:BP 104/60 (BP Location: Left Arm, Patient Position: Sitting, Cuff Size: Normal)   Pulse 80   Ht 5\' 5"  (1.651 m)   Wt 117 lb 8 oz (53.3 kg)   LMP 10/02/2015 (Exact Date)   BMI 19.55 kg/m  General:  Well developed, well nourished, no acute distress Skin:  Warm and dry Neck:  Midline trachea, normal thyroid, good ROM, no lymphadenopathy Lungs; Clear to auscultation bilaterally Breast:  No dominant palpable mass, retraction, or nipple discharge Cardiovascular: Regular rate and rhythm Abdomen:  Soft, non tender, no hepatosplenomegaly Pelvic:  External genitalia is normal in appearance, no lesions.  The vagina is normal in appearance.Has period like blood. Urethra has no lesions or masses. The cervix is smooth.Pap with GC/CHL performed.  Uterus is felt to be normal size, shape, and contour,mildly tender.  No adnexal masses,mild tenderness noted.Bladder is non tender, no masses felt. Extremities/musculoskeletal:  No swelling or varicosities noted, no clubbing or cyanosis,nexplanon in left arm Psych:  No mood changes, alert and cooperative,seems  happy   Impression: Well woman gyn exam and pap Family planning Pelvic pain Nexplanon in place     Plan: Check HIV.RPR and HSV 2 Return in am for nexplanon removal, will rx OCs then and see if pain better after starting the pill,if not will get [place]  Physical in 1 year, pap in 3 if normal

## 2015-10-04 NOTE — Patient Instructions (Signed)
Return in am for nexplanon removal Physical in 1 year, pap in 3 if normal

## 2015-10-05 ENCOUNTER — Ambulatory Visit (INDEPENDENT_AMBULATORY_CARE_PROVIDER_SITE_OTHER): Payer: Medicaid Other | Admitting: Adult Health

## 2015-10-05 ENCOUNTER — Encounter: Payer: Self-pay | Admitting: Adult Health

## 2015-10-05 VITALS — BP 108/70 | HR 78 | Ht 65.0 in | Wt 120.0 lb

## 2015-10-05 DIAGNOSIS — Z309 Encounter for contraceptive management, unspecified: Secondary | ICD-10-CM | POA: Insufficient documentation

## 2015-10-05 DIAGNOSIS — Z3046 Encounter for surveillance of implantable subdermal contraceptive: Secondary | ICD-10-CM

## 2015-10-05 DIAGNOSIS — Z308 Encounter for other contraceptive management: Secondary | ICD-10-CM | POA: Diagnosis not present

## 2015-10-05 DIAGNOSIS — Z3202 Encounter for pregnancy test, result negative: Secondary | ICD-10-CM

## 2015-10-05 DIAGNOSIS — Z30011 Encounter for initial prescription of contraceptive pills: Secondary | ICD-10-CM

## 2015-10-05 HISTORY — DX: Encounter for contraceptive management, unspecified: Z30.9

## 2015-10-05 LAB — RPR: RPR Ser Ql: NONREACTIVE

## 2015-10-05 LAB — POCT URINE PREGNANCY: Preg Test, Ur: NEGATIVE

## 2015-10-05 LAB — HIV ANTIBODY (ROUTINE TESTING W REFLEX): HIV Screen 4th Generation wRfx: NONREACTIVE

## 2015-10-05 LAB — HSV 2 ANTIBODY, IGG: HSV 2 Glycoprotein G Ab, IgG: <0.91 index (ref 0.00–0.90)

## 2015-10-05 MED ORDER — NORETHIN-ETH ESTRAD-FE BIPHAS 1 MG-10 MCG / 10 MCG PO TABS: 1.0000 | ORAL_TABLET | Freq: Every day | ORAL | 11 refills | Status: DC

## 2015-10-05 NOTE — Progress Notes (Signed)
Subjective:     Patient ID: Tonya Harmon, female   DOB: [birth year], 25 y.o.   MRN: [id number]  HPI Tonya Harmon is a 25 year old white female in for nexplanon removal and get on OCs,she had a family planning pap and physical yesterday.  Review of Systems  For nexplanon removal Patient denies any headaches, hearing loss, fatigue, blurred vision, shortness of breath, chest pain, abdominal pain, problems with bowel movements, urination, or intercourse. No joint pain or mood swings.Reviewed past medical,surgical, social and family history. Reviewed medications and allergies.     Objective:   Physical Exam BP 108/70 (BP Location: Left Arm, Patient Position: Sitting, Cuff Size: Normal)   Pulse 78   Ht 5\' 5"  (1.651 m)   Wt 120 lb (54.4 kg)   LMP 10/02/2015 (Exact Date)   BMI 19.97 kg/m  UPT negative,consent signed,time out called. left arm cleansed with betadine, and injected with 1.5 cc 1% lidocaine and waited til numb.Under sterile technique a #11 blade was used to make small vertical incision, and a curved forceps was used to easily remove rod. Steri strips applied. Pressure dressing applied.   Will Rx lo loestrin to start today, and use condoms if has sex.  Assessment:     Nexplanon removal Contraceptive management    Plan:     Rx  Lo loestrin disp 1 pack take 1 daily with 11 refills,start today. Use condoms x 4 weeks, keep clean and dry x 24 hours, no heavy lifting, keep steri strips on x 72 hours, Keep pressure dressing on x 24 hours. Follow up prn problems.

## 2015-10-05 NOTE — Patient Instructions (Signed)
Use condoms x 4 weeks, keep clean and dry x 24 hours, no heavy lifting, keep steri strips on x 72 hours, Keep pressure dressing on x 24 hours. Follow up prn problems. Start lo loestin today

## 2015-10-06 LAB — CYTOLOGY - PAP

## 2016-01-04 ENCOUNTER — Ambulatory Visit (INDEPENDENT_AMBULATORY_CARE_PROVIDER_SITE_OTHER): Payer: Medicaid Other | Admitting: Adult Health

## 2016-01-04 ENCOUNTER — Encounter: Payer: Self-pay | Admitting: Adult Health

## 2016-01-04 VITALS — BP 110/70 | HR 74 | Ht 65.0 in | Wt 125.5 lb

## 2016-01-04 DIAGNOSIS — R252 Cramp and spasm: Secondary | ICD-10-CM

## 2016-01-04 DIAGNOSIS — O3680X0 Pregnancy with inconclusive fetal viability, not applicable or unspecified: Secondary | ICD-10-CM

## 2016-01-04 DIAGNOSIS — Z3201 Encounter for pregnancy test, result positive: Secondary | ICD-10-CM | POA: Diagnosis not present

## 2016-01-04 DIAGNOSIS — N926 Irregular menstruation, unspecified: Secondary | ICD-10-CM

## 2016-01-04 DIAGNOSIS — R112 Nausea with vomiting, unspecified: Secondary | ICD-10-CM

## 2016-01-04 DIAGNOSIS — O219 Vomiting of pregnancy, unspecified: Secondary | ICD-10-CM

## 2016-01-04 DIAGNOSIS — Z3490 Encounter for supervision of normal pregnancy, unspecified, unspecified trimester: Secondary | ICD-10-CM

## 2016-01-04 LAB — POCT URINE PREGNANCY: Preg Test, Ur: POSITIVE — AB

## 2016-01-04 MED ORDER — DOXYLAMINE-PYRIDOXINE 10-10 MG PO TBEC: DELAYED_RELEASE_TABLET | ORAL | 0 refills | Status: DC

## 2016-01-04 NOTE — Progress Notes (Signed)
Subjective:     Patient ID: Tonya Harmon, female   DOB: 12/23/1990, 25 y.o.   MRN: [id number]  HPI Tonya Harmon is a 25 year old white female in for UPT, has missed a period and has some cramping, no bleeding and nausea and vomiting, and trouble sleeping.  Review of Systems  +missed period + cramps + nausea and vomiting + trouble sleeping  Reviewed past medical,surgical, social and family history. Reviewed medications and allergies.     Objective:   Physical Exam BP 110/70 (BP Location: Left Arm, Patient Position: Sitting, Cuff Size: Normal)   Pulse 74   Ht 5\' 5"  (1.651 m)   Wt 125 lb 8 oz (56.9 kg)   LMP 11/03/2015 (Exact Date)   BMI 20.88 kg/m UPT + about 8+6 weeks by LMP with EDD 08/09/16, Skin warm and dry. Neck: mid line trachea, normal thyroid, good ROM, no lymphadenopathy noted. Lungs: clear to ausculation bilaterally. Cardiovascular: regular rate and rhythm.abdomen soft and non tender. PHQ 2 score 0.Will give diclegis to try.    Assessment:     1. Pregnancy examination or test, positive result   2. Pregnancy, unspecified gestational age   233. Nausea and vomiting during pregnancy   4. Encounter to determine fetal viability of pregnancy, not applicable or unspecified fetus       Plan:     Meds ordered this encounter  Medications  . Prenatal Vit-Fe Fum-FA-Omega (ONE-A-DAY WOMENS PRENATAL PO)    Sig: Take by mouth daily.  . Doxylamine-Pyridoxine (DICLEGIS) 10-10 MG TBEC    Sig: Take as directed    Dispense:  48 tablet    Refill:  0    Order Specific Question:   Supervising Provider    Answer:   Duane LopeEURE, LUTHER H [[id number]]  Return in 1 week for dating US Eat often and push fluids Review handout on first trimester

## 2016-01-04 NOTE — Patient Instructions (Signed)
First Trimester of Pregnancy The first trimester of pregnancy is from week 1 until the end of week 12 (months 1 through 3). A week after a sperm fertilizes an egg, the egg will implant on the wall of the uterus. This embryo will begin to develop into a baby. Genes from you and your partner are forming the baby. The female genes determine whether the baby is a boy or a girl. At 6-8 weeks, the eyes and face are formed, and the heartbeat can be seen on ultrasound. At the end of 12 weeks, all the baby's organs are formed.  Now that you are pregnant, you will want to do everything you can to have a healthy baby. Two of the most important things are to get good prenatal care and to follow your health care provider's instructions. Prenatal care is all the medical care you receive before the baby's birth. This care will help prevent, find, and treat any problems during the pregnancy and childbirth. BODY CHANGES Your body goes through many changes during pregnancy. The changes vary from woman to woman.   You may gain or lose a couple of pounds at first.  You may feel sick to your stomach (nauseous) and throw up (vomit). If the vomiting is uncontrollable, call your health care provider.  You may tire easily.  You may develop headaches that can be relieved by medicines approved by your health care provider.  You may urinate more often. Painful urination may mean you have a bladder infection.  You may develop heartburn as a result of your pregnancy.  You may develop constipation because certain hormones are causing the muscles that push waste through your intestines to slow down.  You may develop hemorrhoids or swollen, bulging veins (varicose veins).  Your breasts may begin to grow larger and become tender. Your nipples may stick out more, and the tissue that surrounds them (areola) may become darker.  Your gums may bleed and may be sensitive to brushing and flossing.  Dark spots or blotches (chloasma,  mask of pregnancy) may develop on your face. This will likely fade after the baby is born.  Your menstrual periods will stop.  You may have a loss of appetite.  You may develop cravings for certain kinds of food.  You may have changes in your emotions from day to day, such as being excited to be pregnant or being concerned that something may go wrong with the pregnancy and baby.  You may have more vivid and strange dreams.  You may have changes in your hair. These can include thickening of your hair, rapid growth, and changes in texture. Some women also have hair loss during or after pregnancy, or hair that feels dry or thin. Your hair will most likely return to normal after your baby is born. WHAT TO EXPECT AT YOUR PRENATAL VISITS During a routine prenatal visit:  You will be weighed to make sure you and the baby are growing normally.  Your blood pressure will be taken.  Your abdomen will be measured to track your baby's growth.  The fetal heartbeat will be listened to starting around week 10 or 12 of your pregnancy.  Test results from any previous visits will be discussed. Your health care provider may ask you:  How you are feeling.  If you are feeling the baby move.  If you have had any abnormal symptoms, such as leaking fluid, bleeding, severe headaches, or abdominal cramping.  If you are using any tobacco products,   including cigarettes, chewing tobacco, and electronic cigarettes.  If you have any questions. Other tests that may be performed during your first trimester include:  Blood tests to find your blood type and to check for the presence of any previous infections. They will also be used to check for low iron levels (anemia) and Rh antibodies. Later in the pregnancy, blood tests for diabetes will be done along with other tests if problems develop.  Urine tests to check for infections, diabetes, or protein in the urine.  An ultrasound to confirm the proper growth  and development of the baby.  An amniocentesis to check for possible genetic problems.  Fetal screens for spina bifida and Down syndrome.  You may need other tests to make sure you and the baby are doing well.  HIV (human immunodeficiency virus) testing. Routine prenatal testing includes screening for HIV, unless you choose not to have this test. HOME CARE INSTRUCTIONS  Medicines  Follow your health care provider's instructions regarding medicine use. Specific medicines may be either safe or unsafe to take during pregnancy.  Take your prenatal vitamins as directed.  If you develop constipation, try taking a stool softener if your health care provider approves. Diet  Eat regular, well-balanced meals. Choose a variety of foods, such as meat or vegetable-based protein, fish, milk and low-fat dairy products, vegetables, fruits, and whole grain breads and cereals. Your health care provider will help you determine the amount of weight gain that is right for you.  Avoid raw meat and uncooked cheese. These carry germs that can cause birth defects in the baby.  Eating four or five small meals rather than three large meals a day may help relieve nausea and vomiting. If you start to feel nauseous, eating a few soda crackers can be helpful. Drinking liquids between meals instead of during meals also seems to help nausea and vomiting.  If you develop constipation, eat more high-fiber foods, such as fresh vegetables or fruit and whole grains. Drink enough fluids to keep your urine clear or pale yellow. Activity and Exercise  Exercise only as directed by your health care provider. Exercising will help you:  Control your weight.  Stay in shape.  Be prepared for labor and delivery.  Experiencing pain or cramping in the lower abdomen or low back is a good sign that you should stop exercising. Check with your health care provider before continuing normal exercises.  Try to avoid standing for long  periods of time. Move your legs often if you must stand in one place for a long time.  Avoid heavy lifting.  Wear low-heeled shoes, and practice good posture.  You may continue to have sex unless your health care provider directs you otherwise. Relief of Pain or Discomfort  Wear a good support bra for breast tenderness.   Take warm sitz baths to soothe any pain or discomfort caused by hemorrhoids. Use hemorrhoid cream if your health care provider approves.   Rest with your legs elevated if you have leg cramps or low back pain.  If you develop varicose veins in your legs, wear support hose. Elevate your feet for 15 minutes, 3-4 times a day. Limit salt in your diet. Prenatal Care  Schedule your prenatal visits by the twelfth week of pregnancy. They are usually scheduled monthly at first, then more often in the last 2 months before delivery.  Write down your questions. Take them to your prenatal visits.  Keep all your prenatal visits as directed by your   health care provider. Safety  Wear your seat belt at all times when driving.  Make a list of emergency phone numbers, including numbers for family, friends, the hospital, and police and fire departments. General Tips  Ask your health care provider for a referral to a local prenatal education class. Begin classes no later than at the beginning of month 6 of your pregnancy.  Ask for help if you have counseling or nutritional needs during pregnancy. Your health care provider can offer advice or refer you to specialists for help with various needs.  Do not use hot tubs, steam rooms, or saunas.  Do not douche or use tampons or scented sanitary pads.  Do not cross your legs for long periods of time.  Avoid cat litter boxes and soil used by cats. These carry germs that can cause birth defects in the baby and possibly loss of the fetus by miscarriage or stillbirth.  Avoid all smoking, herbs, alcohol, and medicines not prescribed by  your health care provider. Chemicals in these affect the formation and growth of the baby.  Do not use any tobacco products, including cigarettes, chewing tobacco, and electronic cigarettes. If you need help quitting, ask your health care provider. You may receive counseling support and other resources to help you quit.  Schedule a dentist appointment. At home, brush your teeth with a soft toothbrush and be gentle when you floss. SEEK MEDICAL CARE IF:   You have dizziness.  You have mild pelvic cramps, pelvic pressure, or nagging pain in the abdominal area.  You have persistent nausea, vomiting, or diarrhea.  You have a bad smelling vaginal discharge.  You have pain with urination.  You notice increased swelling in your face, hands, legs, or ankles. SEEK IMMEDIATE MEDICAL CARE IF:   You have a fever.  You are leaking fluid from your vagina.  You have spotting or bleeding from your vagina.  You have severe abdominal cramping or pain.  You have rapid weight gain or loss.  You vomit blood or material that looks like coffee grounds.  You are exposed to [place] measles and have never had them.  You are exposed to fifth disease or chickenpox.  You develop a severe headache.  You have shortness of breath.  You have any kind of trauma, such as from a fall or a car accident.   This information is not intended to replace advice given to you by your health care provider. Make sure you discuss any questions you have with your health care provider.   Document Released: 02/12/2001 Document Revised: 03/11/2014 Document Reviewed: 12/29/2012 [employer] Interactive Patient Education 2016 [employer]  

## 2016-01-11 ENCOUNTER — Ambulatory Visit (INDEPENDENT_AMBULATORY_CARE_PROVIDER_SITE_OTHER): Payer: Medicaid Other

## 2016-01-11 DIAGNOSIS — Z3A10 10 weeks gestation of pregnancy: Secondary | ICD-10-CM | POA: Diagnosis not present

## 2016-01-11 DIAGNOSIS — O3680X0 Pregnancy with inconclusive fetal viability, not applicable or unspecified: Secondary | ICD-10-CM

## 2016-01-11 NOTE — Progress Notes (Signed)
US 9+6 wks,single IUP,crl 33.0 mm,normal ov's bilat,pos fht 167 bpm

## 2016-01-31 ENCOUNTER — Encounter: Payer: Self-pay | Admitting: Women's Health

## 2016-01-31 ENCOUNTER — Ambulatory Visit (INDEPENDENT_AMBULATORY_CARE_PROVIDER_SITE_OTHER): Payer: Medicaid Other | Admitting: Women's Health

## 2016-01-31 VITALS — BP 102/44 | HR 84 | Wt 130.0 lb

## 2016-01-31 DIAGNOSIS — Z331 Pregnant state, incidental: Secondary | ICD-10-CM

## 2016-01-31 DIAGNOSIS — Z3490 Encounter for supervision of normal pregnancy, unspecified, unspecified trimester: Secondary | ICD-10-CM | POA: Insufficient documentation

## 2016-01-31 DIAGNOSIS — Z3481 Encounter for supervision of other normal pregnancy, first trimester: Secondary | ICD-10-CM

## 2016-01-31 DIAGNOSIS — Z1389 Encounter for screening for other disorder: Secondary | ICD-10-CM

## 2016-01-31 DIAGNOSIS — Z3A13 13 weeks gestation of pregnancy: Secondary | ICD-10-CM | POA: Diagnosis not present

## 2016-01-31 DIAGNOSIS — Z98891 History of uterine scar from previous surgery: Secondary | ICD-10-CM | POA: Insufficient documentation

## 2016-01-31 DIAGNOSIS — R569 Unspecified convulsions: Secondary | ICD-10-CM

## 2016-01-31 DIAGNOSIS — Z3682 Encounter for antenatal screening for nuchal translucency: Secondary | ICD-10-CM

## 2016-01-31 DIAGNOSIS — O34219 Maternal care for unspecified type scar from previous cesarean delivery: Secondary | ICD-10-CM

## 2016-01-31 LAB — POCT URINALYSIS DIPSTICK
Blood, UA: NEGATIVE
Glucose, UA: NEGATIVE
Ketones, UA: NEGATIVE
Leukocytes, UA: NEGATIVE
Nitrite, UA: NEGATIVE
Protein, UA: NEGATIVE

## 2016-01-31 MED ORDER — LEVETIRACETAM 500 MG PO TABS: ORAL_TABLET | ORAL | 6 refills | Status: DC

## 2016-01-31 NOTE — Patient Instructions (Addendum)
Make an appointment with a neurologist as soon as you can   Tips to Help You Sleep Better:   Get into a bedtime routine, try to do the same thing every night before going to bed to try to help your body wind down  Warm baths  Avoid caffeine for at least 3 hours before going to sleep   Keep your room at a slightly cooler temperature, can try running a fan  Turn off TV, lights, phone, electronics  Lots of pillows if needed to help you get comfortable  Lavender scented items can help you sleep. You can place lavender essential oil on a cotton ball and place under your pillowcase, or place in a diffuser. [employer] has a lavender scented sleep line (plug-ins, sprays, etc). Look in the pillow aisle for lavender scented pillows.   If none of the above things help, you can try 1/2 to 1 tablet of benadryl, unisom, or tylenol pm. Do not take this every night, only when you really need it.    Nausea & Vomiting  Have saltine crackers or pretzels by your bed and eat a few bites before you raise your head out of bed in the morning  Eat small frequent meals throughout the day instead of large meals  Drink plenty of fluids throughout the day to stay hydrated, just don't drink a lot of fluids with your meals.  This can make your stomach fill up faster making you feel sick  Do not brush your teeth right after you eat  Products with real ginger are good for nausea, like ginger ale and ginger hard candy Make sure it says made with real ginger!  Sucking on sour candy like lemon heads is also good for nausea  If your prenatal vitamins make you nauseated, take them at night so you will sleep through the nausea  Sea Bands  If you feel like you need medicine for the nausea & vomiting please let us know  If you are unable to keep any fluids or food down please let us know   Constipation  Drink plenty of fluid, preferably water, throughout the day  Eat foods high in fiber such as fruits,  vegetables, and grains  Exercise, such as walking, is a good way to keep your bowels regular  Drink warm fluids, especially warm prune juice, or decaf coffee  Eat a 1/2 cup of real oatmeal (not instant), 1/2 cup applesauce, and 1/2-1 cup warm prune juice every day  If needed, you may take Colace (docusate sodium) stool softener once or twice a day to help keep the stool soft. If you are pregnant, wait until you are out of your first trimester (12-14 weeks of pregnancy)  If you still are having problems with constipation, you may take Miralax once daily as needed to help keep your bowels regular.  If you are pregnant, wait until you are out of your first trimester (12-14 weeks of pregnancy)   First Trimester of Pregnancy The first trimester of pregnancy is from week 1 until the end of week 12 (months 1 through 3). A week after a sperm fertilizes an egg, the egg will implant on the wall of the uterus. This embryo will begin to develop into a baby. Genes from you and your partner are forming the baby. The female genes determine whether the baby is a boy or a girl. At 6-8 weeks, the eyes and face are formed, and the heartbeat can be seen on ultrasound. At the  end of 12 weeks, all the baby's organs are formed.  Now that you are pregnant, you will want to do everything you can to have a healthy baby. Two of the most important things are to get good prenatal care and to follow your health care provider's instructions. Prenatal care is all the medical care you receive before the baby's birth. This care will help prevent, find, and treat any problems during the pregnancy and childbirth. BODY CHANGES Your body goes through many changes during pregnancy. The changes vary from woman to woman.   You may gain or lose a couple of pounds at first.  You may feel sick to your stomach (nauseous) and throw up (vomit). If the vomiting is uncontrollable, call your health care provider.  You may tire easily.  You may  develop headaches that can be relieved by medicines approved by your health care provider.  You may urinate more often. Painful urination may mean you have a bladder infection.  You may develop heartburn as a result of your pregnancy.  You may develop constipation because certain hormones are causing the muscles that push waste through your intestines to slow down.  You may develop hemorrhoids or swollen, bulging veins (varicose veins).  Your breasts may begin to grow larger and become tender. Your nipples may stick out more, and the tissue that surrounds them (areola) may become darker.  Your gums may bleed and may be sensitive to brushing and flossing.  Dark spots or blotches (chloasma, mask of pregnancy) may develop on your face. This will likely fade after the baby is born.  Your menstrual periods will stop.  You may have a loss of appetite.  You may develop cravings for certain kinds of food.  You may have changes in your emotions from day to day, such as being excited to be pregnant or being concerned that something may go wrong with the pregnancy and baby.  You may have more vivid and strange dreams.  You may have changes in your hair. These can include thickening of your hair, rapid growth, and changes in texture. Some women also have hair loss during or after pregnancy, or hair that feels dry or thin. Your hair will most likely return to normal after your baby is born. WHAT TO EXPECT AT YOUR PRENATAL VISITS During a routine prenatal visit:  You will be weighed to make sure you and the baby are growing normally.  Your blood pressure will be taken.  Your abdomen will be measured to track your baby's growth.  The fetal heartbeat will be listened to starting around week 10 or 12 of your pregnancy.  Test results from any previous visits will be discussed. Your health care provider may ask you:  How you are feeling.  If you are feeling the baby move.  If you have had  any abnormal symptoms, such as leaking fluid, bleeding, severe headaches, or abdominal cramping.  If you have any questions. Other tests that may be performed during your first trimester include:  Blood tests to find your blood type and to check for the presence of any previous infections. They will also be used to check for low iron levels (anemia) and Rh antibodies. Later in the pregnancy, blood tests for diabetes will be done along with other tests if problems develop.  Urine tests to check for infections, diabetes, or protein in the urine.  An ultrasound to confirm the proper growth and development of the baby.  An amniocentesis to check  for possible genetic problems.  Fetal screens for spina bifida and Down syndrome.  You may need other tests to make sure you and the baby are doing well. HOME CARE INSTRUCTIONS  Medicines  Follow your health care provider's instructions regarding medicine use. Specific medicines may be either safe or unsafe to take during pregnancy.  Take your prenatal vitamins as directed.  If you develop constipation, try taking a stool softener if your health care provider approves. Diet  Eat regular, well-balanced meals. Choose a variety of foods, such as meat or vegetable-based protein, fish, milk and low-fat dairy products, vegetables, fruits, and whole grain breads and cereals. Your health care provider will help you determine the amount of weight gain that is right for you.  Avoid raw meat and uncooked cheese. These carry germs that can cause birth defects in the baby.  Eating four or five small meals rather than three large meals a day may help relieve nausea and vomiting. If you start to feel nauseous, eating a few soda crackers can be helpful. Drinking liquids between meals instead of during meals also seems to help nausea and vomiting.  If you develop constipation, eat more high-fiber foods, such as fresh vegetables or fruit and whole grains. Drink  enough fluids to keep your urine clear or pale yellow. Activity and Exercise  Exercise only as directed by your health care provider. Exercising will help you:  Control your weight.  Stay in shape.  Be prepared for labor and delivery.  Experiencing pain or cramping in the lower abdomen or low back is a good sign that you should stop exercising. Check with your health care provider before continuing normal exercises.  Try to avoid standing for long periods of time. Move your legs often if you must stand in one place for a long time.  Avoid heavy lifting.  Wear low-heeled shoes, and practice good posture.  You may continue to have sex unless your health care provider directs you otherwise. Relief of Pain or Discomfort  Wear a good support bra for breast tenderness.   Take warm sitz baths to soothe any pain or discomfort caused by hemorrhoids. Use hemorrhoid cream if your health care provider approves.   Rest with your legs elevated if you have leg cramps or low back pain.  If you develop varicose veins in your legs, wear support hose. Elevate your feet for 15 minutes, 3-4 times a day. Limit salt in your diet. Prenatal Care  Schedule your prenatal visits by the twelfth week of pregnancy. They are usually scheduled monthly at first, then more often in the last 2 months before delivery.  Write down your questions. Take them to your prenatal visits.  Keep all your prenatal visits as directed by your health care provider. Safety  Wear your seat belt at all times when driving.  Make a list of emergency phone numbers, including numbers for family, friends, the hospital, and police and fire departments. General Tips  Ask your health care provider for a referral to a local prenatal education class. Begin classes no later than at the beginning of month 6 of your pregnancy.  Ask for help if you have counseling or nutritional needs during pregnancy. Your health care provider can  offer advice or refer you to specialists for help with various needs.  Do not use hot tubs, steam rooms, or saunas.  Do not douche or use tampons or scented sanitary pads.  Do not cross your legs for long periods of time.  Avoid cat litter boxes and soil used by cats. These carry germs that can cause birth defects in the baby and possibly loss of the fetus by miscarriage or stillbirth.  Avoid all smoking, herbs, alcohol, and medicines not prescribed by your health care provider. Chemicals in these affect the formation and growth of the baby.  Schedule a dentist appointment. At home, brush your teeth with a soft toothbrush and be gentle when you floss. SEEK MEDICAL CARE IF:   You have dizziness.  You have mild pelvic cramps, pelvic pressure, or nagging pain in the abdominal area.  You have persistent nausea, vomiting, or diarrhea.  You have a bad smelling vaginal discharge.  You have pain with urination.  You notice increased swelling in your face, hands, legs, or ankles. SEEK IMMEDIATE MEDICAL CARE IF:   You have a fever.  You are leaking fluid from your vagina.  You have spotting or bleeding from your vagina.  You have severe abdominal cramping or pain.  You have rapid weight gain or loss.  You vomit blood or material that looks like coffee grounds.  You are exposed to [place] measles and have never had them.  You are exposed to fifth disease or chickenpox.  You develop a severe headache.  You have shortness of breath.  You have any kind of trauma, such as from a fall or a car accident. Document Released: 02/12/2001 Document Revised: 07/05/2013 Document Reviewed: 12/29/2012 [facility] Patient Information 2015 [place], [place]. This information is not intended to replace advice given to you by your health care provider. Make sure you discuss any questions you have with your health care provider.

## 2016-01-31 NOTE — Progress Notes (Addendum)
Subjective:  Tonya Harmon is a 25 y.o. 274P3003 Caucasian female at 3724w5d by LMP c/w 9wk u/s, being seen today for her first obstetrical visit.  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 Sudie BaileyKnowlton since, stopped Topamax around end of Oct- no seizures since, last seizure 9/5, prior to that 9/1 and 8/18. .  Pregnancy history fully reviewed.  Patient reports n/v- taking 3 diclegis helps some. Denies vb, cramping, uti s/s, abnormal/malodorous vag d/c, or vulvovaginal itching/irritation.  BP (!) 102/44   Pulse 84   Wt 130 lb (59 kg)   LMP 11/03/2015 (Exact Date)   BMI 21.63 kg/m   HISTORY: OB History  Gravida Para Term Preterm AB Living  4 3 3     3   SAB TAB Ectopic Multiple Live Births          3    # Outcome Date GA Lbr Len/2nd Weight Sex Delivery Anes PTL Lv  4 Current           3 Term 05/18/12 983w2d  8 lb 12 oz (3.969 kg) F CS-LTranv Spinal N LIV  2 Term 08/11/09 6898w0d  8 lb 12 oz (3.969 kg) M CS-Unspec  N LIV  1 Term 02/16/07 3935w1d  8 lb 6 oz (3.799 kg) M CS-Unspec  N LIV     Past Medical History:  Diagnosis Date  . Anxiety   . Back pain   . Bipolar 1 disorder (HCC)   . Bipolar disorder (HCC)   . Common migraine with intractable migraine 06/08/2014  . Contraceptive management 10/05/2015  . Depression   . Epilepsy (HCC)   . GERD (gastroesophageal reflux disease)    pregnancy related-rolaids prn  . Headache(784.0)   . Insomnia   . Nexplanon in place 10/04/2015  . Pelvic pain in female 10/04/2015  . PONV (postoperative nausea and vomiting)   . Sciatica   . Seizures (HCC)    Past Surgical History:  Procedure Laterality Date  . CESAREAN SECTION  2011,2008  . CESAREAN SECTION N/A 05/18/2012   Procedure: CESAREAN SECTION;  Surgeon: Tilda BurrowJohn V Ferguson, MD;  Location: [facility];  Service: Obstetrics;  Laterality: N/A;  .  INCISION AND DRAINAGE ABSCESS Left 09/10/2013   Procedure: INCISION AND DRAINAGE ABSCESS, left breast/axilla;  Surgeon: Dalia HeadingMark A Jenkins, MD;  Location: [facility];  Service: General;  Laterality: Left;   Family History  Problem Relation Age of Onset  . Diabetes Mother   . Fibromyalgia Mother   . Rheum arthritis Mother   . Diabetes Other   . Hypertension Other   . Cancer Other   . Coronary artery disease Other   . Tremor Maternal Grandmother   . Cancer Maternal Grandmother     breast, uterine  . Alzheimer's disease Maternal Grandmother   . Stroke Maternal Grandmother   . Coronary artery disease Maternal Grandfather   . Diabetes Maternal Grandfather   . Hypertension Maternal Grandfather   . Diabetes Paternal Grandmother   . Hypertension Paternal Grandmother   . Cancer Paternal Grandmother     breast  . Cancer Paternal Grandfather     pancreatic  . Hypertension Paternal Grandfather   . Seizures Neg Hx     Exam   System:     General: Well developed & nourished, no acute distress   Skin: Warm & dry, normal coloration and turgor,  no rashes   Neurologic: Alert & oriented, normal mood   Cardiovascular: Regular rate & rhythm   Respiratory: Effort & rate normal, LCTAB, acyanotic   Abdomen: Soft, non tender   Extremities: normal strength, tone   Thin prep pap smear 10/04/15 neg FHR: 160 via doppler   Assessment:   Pregnancy: [id number] Patient Active Problem List   Diagnosis Date Noted  . Contraceptive management 10/05/2015  . Pelvic pain in female 10/04/2015  . Nexplanon in place 10/04/2015  . Seizures (HCC) 06/08/2014  . Common migraine with intractable migraine 06/08/2014  . Axillary mass 09/07/2013  . Cellulitis of axilla, left 09/07/2013  . Cellulitis 09/07/2013  . Previous cesarean delivery, delivered, with or without mention of antepartum condition 05/25/2012  . Anxiety 05/06/2012  . Depression 05/06/2012    1324w5d G4P3003 New OB visit Epilepsy Prev c/s x 3  Plan:   Initial labs drawn Continue prenatal vitamins Problem list reviewed and updated Reviewed n/v relief measures and warning s/s to report, can increase diclegis to total of 4/day- if not helping let us know Discussed h/o epilepsy w/ LHE, start Keppra 500mg  daily x 1wk, then BID thereafter Pt to make appt w/ neuro Reviewed recommended weight gain based on pre-gravid BMI Encouraged well-balanced diet Genetic Screening discussed Integrated Screen: requested Cystic fibrosis screening discussed declined Ultrasound discussed; fetal survey: requested Follow up in asap for 1st it/nt (no visit), then 2 weeks for visit [facility] completed Flu shot today Too late for high dose folic acid prophylaxis, although Topamax  Marge DuncansBooker, Trisha Morandi Randall CNM, [facility]NP-BC 01/31/2016 3:11 PM

## 2016-02-01 ENCOUNTER — Other Ambulatory Visit: Payer: Medicaid Other

## 2016-02-01 ENCOUNTER — Ambulatory Visit (INDEPENDENT_AMBULATORY_CARE_PROVIDER_SITE_OTHER): Payer: Medicaid Other

## 2016-02-01 DIAGNOSIS — Z3682 Encounter for antenatal screening for nuchal translucency: Secondary | ICD-10-CM

## 2016-02-01 DIAGNOSIS — Z3A13 13 weeks gestation of pregnancy: Secondary | ICD-10-CM | POA: Diagnosis not present

## 2016-02-01 LAB — URINALYSIS, ROUTINE W REFLEX MICROSCOPIC
Bilirubin, UA: NEGATIVE
Glucose, UA: NEGATIVE
Ketones, UA: NEGATIVE
Leukocytes, UA: NEGATIVE
Nitrite, UA: NEGATIVE
Protein, UA: NEGATIVE
RBC, UA: NEGATIVE
Specific Gravity, UA: 1.018 (ref 1.005–1.030)
Urobilinogen, Ur: 0.2 mg/dL (ref 0.2–1.0)
pH, UA: 7.5 (ref 5.0–7.5)

## 2016-02-01 LAB — PMP SCREEN PROFILE (10S), URINE
Amphetamine Screen, Ur: NEGATIVE ng/mL
Barbiturate Screen, Ur: NEGATIVE ng/mL
Benzodiazepine Screen, Urine: NEGATIVE ng/mL
Cannabinoids Ur Ql Scn: NEGATIVE ng/mL
Cocaine(Metab.)Screen, Urine: NEGATIVE ng/mL
Creatinine(Crt), U: 55.6 mg/dL (ref 20.0–300.0)
Methadone Scn, Ur: NEGATIVE ng/mL
Opiate Scrn, Ur: POSITIVE ng/mL
Oxycodone+Oxymorphone Ur Ql Scn: NEGATIVE ng/mL
PCP Scrn, Ur: NEGATIVE ng/mL
Ph of Urine: 7.7 (ref 4.5–8.9)
Propoxyphene, Screen: NEGATIVE ng/mL

## 2016-02-01 LAB — CBC
Hematocrit: 34.4 % (ref 34.0–46.6)
Hemoglobin: 11 g/dL — ABNORMAL LOW (ref 11.1–15.9)
MCH: 29.6 pg (ref 26.6–33.0)
MCHC: 32 g/dL (ref 31.5–35.7)
MCV: 93 fL (ref 79–97)
Platelets: 240 10*3/uL (ref 150–379)
RBC: 3.71 x10E6/uL — ABNORMAL LOW (ref 3.77–5.28)
RDW: 14.1 % (ref 12.3–15.4)
WBC: 7 10*3/uL (ref 3.4–10.8)

## 2016-02-01 LAB — ABO/RH: Rh Factor: POSITIVE

## 2016-02-01 LAB — VARICELLA ZOSTER ANTIBODY, IGG: Varicella zoster IgG: 454 index (ref >165)

## 2016-02-01 LAB — RPR: RPR Ser Ql: NONREACTIVE

## 2016-02-01 LAB — HEPATITIS B SURFACE ANTIGEN: Hepatitis B Surface Ag: NEGATIVE

## 2016-02-01 LAB — RUBELLA SCREEN: Rubella Antibodies, IGG: 3.39 index (ref >0.99)

## 2016-02-01 LAB — HIV ANTIBODY (ROUTINE TESTING W REFLEX): HIV Screen 4th Generation wRfx: NONREACTIVE

## 2016-02-01 LAB — ANTIBODY SCREEN: Antibody Screen: NEGATIVE

## 2016-02-01 NOTE — Progress Notes (Signed)
US 12+6 wks,measurements c/w dates,fhr 153 bpm,crl 73.1 mm,post pl gr 0,normal ov bilat,NB present,NT 1.4 mm

## 2016-02-02 LAB — GC/CHLAMYDIA PROBE AMP
Chlamydia trachomatis, NAA: NEGATIVE
Neisseria gonorrhoeae by PCR: NEGATIVE

## 2016-02-02 LAB — URINE CULTURE

## 2016-02-05 LAB — MATERNAL SCREEN, INTEGRATED #1
Crown Rump Length: 73.1 mm
Gest. Age on Collection Date: 13.1 weeks
Maternal Age at EDD: 27.8 years
Nuchal Translucency (NT): 1.4 mm
Number of Fetuses: 1
PAPP-A Value: 749.8 ng/mL
Weight: 130 [lb_av]

## 2016-02-07 ENCOUNTER — Encounter: Payer: Self-pay | Admitting: Women's Health

## 2016-02-07 DIAGNOSIS — O99891 Other specified diseases and conditions complicating pregnancy: Secondary | ICD-10-CM | POA: Insufficient documentation

## 2016-02-07 DIAGNOSIS — O9989 Other specified diseases and conditions complicating pregnancy, childbirth and the puerperium: Secondary | ICD-10-CM

## 2016-02-07 DIAGNOSIS — R8271 Bacteriuria: Secondary | ICD-10-CM | POA: Insufficient documentation

## 2016-02-09 ENCOUNTER — Telehealth: Payer: Self-pay | Admitting: Women's Health

## 2016-02-09 MED ORDER — NITROFURANTOIN MONOHYD MACRO 100 MG PO CAPS: 100.0000 mg | ORAL_CAPSULE | Freq: Two times a day (BID) | ORAL | 0 refills | Status: DC

## 2016-02-09 NOTE — Telephone Encounter (Signed)
Called pt, no answer, voicemail box full. Rx macrobid bid x 7d for +urine culture.  Cheral MarkerKimberly R. Vania Rosero, CNM, [facility]NP-BC 02/09/2016 1:34 PM

## 2016-02-12 ENCOUNTER — Telehealth: Payer: Self-pay | Admitting: Women's Health

## 2016-02-12 NOTE — Telephone Encounter (Signed)
Called pt to notify of UTI, no answer, mailbox full, has appt 12/13. Rx already sent in for macrobid.  Cheral MarkerKimberly R. Rylah Fukuda, CNM, [facility]NP-BC 02/12/2016 9:43 AM

## 2016-02-14 ENCOUNTER — Encounter: Payer: Self-pay | Admitting: Women's Health

## 2016-02-14 ENCOUNTER — Ambulatory Visit (INDEPENDENT_AMBULATORY_CARE_PROVIDER_SITE_OTHER): Payer: Medicaid Other | Admitting: Women's Health

## 2016-02-14 VITALS — BP 90/60 | HR 100 | Temp 98.4°F | Wt 128.0 lb

## 2016-02-14 DIAGNOSIS — Z3A15 15 weeks gestation of pregnancy: Secondary | ICD-10-CM

## 2016-02-14 DIAGNOSIS — Z3482 Encounter for supervision of other normal pregnancy, second trimester: Secondary | ICD-10-CM

## 2016-02-14 DIAGNOSIS — Z1389 Encounter for screening for other disorder: Secondary | ICD-10-CM

## 2016-02-14 DIAGNOSIS — Z331 Pregnant state, incidental: Secondary | ICD-10-CM

## 2016-02-14 DIAGNOSIS — F1190 Opioid use, unspecified, uncomplicated: Secondary | ICD-10-CM

## 2016-02-14 DIAGNOSIS — O9989 Other specified diseases and conditions complicating pregnancy, childbirth and the puerperium: Secondary | ICD-10-CM

## 2016-02-14 DIAGNOSIS — Z363 Encounter for antenatal screening for malformations: Secondary | ICD-10-CM

## 2016-02-14 DIAGNOSIS — O99322 Drug use complicating pregnancy, second trimester: Secondary | ICD-10-CM

## 2016-02-14 DIAGNOSIS — R8271 Bacteriuria: Secondary | ICD-10-CM

## 2016-02-14 LAB — POCT URINALYSIS DIPSTICK
Blood, UA: NEGATIVE
Glucose, UA: NEGATIVE
Ketones, UA: NEGATIVE
Leukocytes, UA: NEGATIVE
Protein, UA: NEGATIVE

## 2016-02-14 NOTE — Patient Instructions (Addendum)
Make appointment with neurologist  For Headaches:   Stay well hydrated, drink enough water so that your urine is clear, sometimes if you are dehydrated you can get headaches  Eat small frequent meals and snacks, sometimes if you are hungry you can get headaches  Sometimes you get headaches during pregnancy from the pregnancy hormones  You can try tylenol (1-2 regular strength 325mg  or 1-2 extra strength 500mg ) as directed on the box. The least amount of medication that works is best.   Cool compresses (cool wet washcloth or ice pack) to area of head that is hurting  You can also try drinking a caffeinated drink to see if this will help  If not helping, try below:  For Prevention of Headaches/Migraines:  CoQ10 100mg  three times daily  Vitamin B2 400mg  daily  Magnesium Oxide 400-600mg  daily  If You Get a Bad Headache/Migraine:  Benadryl 25mg    Magnesium Oxide  1 large Gatorade  2 extra strength Tylenol (1,000mg  total)  1 cup coffee or Coke  If this doesn't help please call us @ [phone]   Second Trimester of Pregnancy The second trimester is from week 13 through week 28 (months 4 through 6). The second trimester is often a time when you feel your best. Your body has also adjusted to being pregnant, and you begin to feel better physically. Usually, morning sickness has lessened or quit completely, you may have more energy, and you may have an increase in appetite. The second trimester is also a time when the fetus is growing rapidly. At the end of the sixth month, the fetus is about 9 inches long and weighs about 1 pounds. You will likely begin to feel the baby move (quickening) between 18 and 20 weeks of the pregnancy. Body changes during your second trimester Your body continues to go through many changes during your second trimester. The changes vary from woman to woman.  Your weight will continue to increase. You will notice your lower abdomen bulging out.  You  may begin to get stretch marks on your hips, abdomen, and breasts.  You may develop headaches that can be relieved by medicines. The medicines should be approved by your health care provider.  You may urinate more often because the fetus is pressing on your bladder.  You may develop or continue to have heartburn as a result of your pregnancy.  You may develop constipation because certain hormones are causing the muscles that push waste through your intestines to slow down.  You may develop hemorrhoids or swollen, bulging veins (varicose veins).  You may have back pain. This is caused by:  Weight gain.  Pregnancy hormones that are relaxing the joints in your pelvis.  A shift in weight and the muscles that support your balance.  Your breasts will continue to grow and they will continue to become tender.  Your gums may bleed and may be sensitive to brushing and flossing.  Dark spots or blotches (chloasma, mask of pregnancy) may develop on your face. This will likely fade after the baby is born.  A dark line from your belly button to the pubic area (linea nigra) may appear. This will likely fade after the baby is born.  You may have changes in your hair. These can include thickening of your hair, rapid growth, and changes in texture. Some women also have hair loss during or after pregnancy, or hair that feels dry or thin. Your hair will most likely return to normal after your baby  is born. What to expect at prenatal visits During a routine prenatal visit:  You will be weighed to make sure you and the fetus are growing normally.  Your blood pressure will be taken.  Your abdomen will be measured to track your baby's growth.  The fetal heartbeat will be listened to.  Any test results from the previous visit will be discussed. Your health care provider may ask you:  How you are feeling.  If you are feeling the baby move.  If you have had any abnormal symptoms, such as leaking  fluid, bleeding, severe headaches, or abdominal cramping.  If you are using any tobacco products, including cigarettes, chewing tobacco, and electronic cigarettes.  If you have any questions. Other tests that may be performed during your second trimester include:  Blood tests that check for:  Low iron levels (anemia).  Gestational diabetes (between 24 and 28 weeks).  Rh antibodies. This is to check for a protein on red blood cells (Rh factor).  Urine tests to check for infections, diabetes, or protein in the urine.  An ultrasound to confirm the proper growth and development of the baby.  An amniocentesis to check for possible genetic problems.  Fetal screens for spina bifida and Down syndrome.  HIV (human immunodeficiency virus) testing. Routine prenatal testing includes screening for HIV, unless you choose not to have this test. Follow these instructions at home: Eating and drinking  Continue to eat regular, healthy meals.  Avoid raw meat, uncooked cheese, cat litter boxes, and soil used by cats. These carry germs that can cause birth defects in the baby.  Take your prenatal vitamins.  Take 1500-2000 mg of calcium daily starting at the 20th week of pregnancy until you deliver your baby.  If you develop constipation:  Take over-the-counter or prescription medicines.  Drink enough fluid to keep your urine clear or pale yellow.  Eat foods that are high in fiber, such as fresh fruits and vegetables, whole grains, and beans.  Limit foods that are high in fat and processed sugars, such as fried and sweet foods. Activity  Exercise only as directed by your health care provider. Experiencing uterine cramps is a good sign to stop exercising.  Avoid heavy lifting, wear low heel shoes, and practice good posture.  Wear your seat belt at all times when driving.  Rest with your legs elevated if you have leg cramps or low back pain.  Wear a good support bra for breast  tenderness.  Do not use hot tubs, steam rooms, or saunas. Lifestyle  Avoid all smoking, herbs, alcohol, and unprescribed drugs. These chemicals affect the formation and growth of the baby.  Do not use any products that contain nicotine or tobacco, such as cigarettes and e-cigarettes. If you need help quitting, ask your health care provider.  A sexual relationship may be continued unless your health care provider directs you otherwise. General instructions  Follow your health care provider's instructions regarding medicine use. There are medicines that are either safe or unsafe to take during pregnancy.  Take warm sitz baths to soothe any pain or discomfort caused by hemorrhoids. Use hemorrhoid cream if your health care provider approves.  If you develop varicose veins, wear support hose. Elevate your feet for 15 minutes, 3-4 times a day. Limit salt in your diet.  Visit your dentist if you have not gone yet during your pregnancy. Use a soft toothbrush to brush your teeth and be gentle when you floss.  Keep all follow-up  prenatal visits as told by your health care provider. This is important. Contact a health care provider if:  You have dizziness.  You have mild pelvic cramps, pelvic pressure, or nagging pain in the abdominal area.  You have persistent nausea, vomiting, or diarrhea.  You have a bad smelling vaginal discharge.  You have pain with urination. Get help right away if:  You have a fever.  You are leaking fluid from your vagina.  You have spotting or bleeding from your vagina.  You have severe abdominal cramping or pain.  You have rapid weight gain or weight loss.  You have shortness of breath with chest pain.  You notice sudden or extreme swelling of your face, hands, ankles, feet, or legs.  You have not felt your baby move in over an hour.  You have severe headaches that do not go away with medicine.  You have vision changes. Summary  The second  trimester is from week 13 through week 28 (months 4 through 6). It is also a time when the fetus is growing rapidly.  Your body goes through many changes during pregnancy. The changes vary from woman to woman.  Avoid all smoking, herbs, alcohol, and unprescribed drugs. These chemicals affect the formation and growth your baby.  Do not use any tobacco products, such as cigarettes, chewing tobacco, and e-cigarettes. If you need help quitting, ask your health care provider.  Contact your health care provider if you have any questions. Keep all prenatal visits as told by your health care provider. This is important. This information is not intended to replace advice given to you by your health care provider. Make sure you discuss any questions you have with your health care provider. Document Released: 02/12/2001 Document Revised: 07/27/2015 Document Reviewed: 04/21/2012 Elsevier Interactive Patient Education  2017 [employer].

## 2016-02-14 NOTE — Progress Notes (Signed)
Low-risk OB appointment [id number] 2537w5d Estimated Date of Delivery: 08/09/16 BP 90/60   Pulse 100   Temp 98.4 F (36.9 C)   Wt 128 lb (58.1 kg)   LMP 11/03/2015 (Exact Date)   BMI 21.30 kg/m   BP, weight, and urine reviewed.  Refer to obstetrical flow sheet for FH & FHR.  No fm yet. Denies cramping, lof, vb, or uti s/s. Hasn't made appt w/ neurologist d/t transportation issues. To make appt asap to discuss seizures. Reports she's doing well on Keppra, no seizures since starting. Discussed uds +for opiates, no rx in [employer] [facility], states she took 1 of her relatives hydrocodone 5mg  for bad headache- hasn't had any since last visit. Will repeat uds today. Advised not to take other people's meds.  Notified of +urine culture, have been unable to contact her by phone. Rx for macrobid sent in on 12/8, to go ahead and start asap Reviewed normal nt u/s she had after last visit, warning s/s to report. Plan:  Continue routine obstetrical care  F/U in 4wks for OB appointment, anatomy u/s, and 2nd IT

## 2016-02-15 LAB — PMP SCREEN PROFILE (10S), URINE
Amphetamine Screen, Ur: NEGATIVE ng/mL
Barbiturate Screen, Ur: NEGATIVE ng/mL
Benzodiazepine Screen, Urine: POSITIVE ng/mL
Cannabinoids Ur Ql Scn: NEGATIVE ng/mL
Cocaine(Metab.)Screen, Urine: NEGATIVE ng/mL
Creatinine(Crt), U: 105.4 mg/dL (ref 20.0–300.0)
Methadone Scn, Ur: NEGATIVE ng/mL
Opiate Scrn, Ur: NEGATIVE ng/mL
Oxycodone+Oxymorphone Ur Ql Scn: NEGATIVE ng/mL
PCP Scrn, Ur: NEGATIVE ng/mL
Ph of Urine: 6.8 (ref 4.5–8.9)
Propoxyphene, Screen: NEGATIVE ng/mL

## 2016-03-05 ENCOUNTER — Encounter (HOSPITAL_COMMUNITY): Payer: Self-pay | Admitting: Emergency Medicine

## 2016-03-05 ENCOUNTER — Emergency Department (HOSPITAL_COMMUNITY)
Admission: EM | Admit: 2016-03-05 | Discharge: 2016-03-06 | Disposition: A | Discharge status: Home / Self Care | Payer: Medicaid Other | Attending: Emergency Medicine | Admitting: Emergency Medicine

## 2016-03-05 DIAGNOSIS — Z79899 Other long term (current) drug therapy: Secondary | ICD-10-CM | POA: Insufficient documentation

## 2016-03-05 DIAGNOSIS — Z87891 Personal history of nicotine dependence: Secondary | ICD-10-CM | POA: Insufficient documentation

## 2016-03-05 DIAGNOSIS — H1033 Unspecified acute conjunctivitis, bilateral: Secondary | ICD-10-CM

## 2016-03-05 DIAGNOSIS — Z3A17 17 weeks gestation of pregnancy: Secondary | ICD-10-CM | POA: Diagnosis not present

## 2016-03-05 DIAGNOSIS — O2312 Infections of bladder in pregnancy, second trimester: Secondary | ICD-10-CM | POA: Insufficient documentation

## 2016-03-05 DIAGNOSIS — N3000 Acute cystitis without hematuria: Secondary | ICD-10-CM

## 2016-03-05 DIAGNOSIS — O9989 Other specified diseases and conditions complicating pregnancy, childbirth and the puerperium: Secondary | ICD-10-CM | POA: Insufficient documentation

## 2016-03-05 DIAGNOSIS — R52 Pain, unspecified: Secondary | ICD-10-CM | POA: Diagnosis present

## 2016-03-05 MED ORDER — ERYTHROMYCIN 5 MG/GM OP OINT
TOPICAL_OINTMENT | Freq: Three times a day (TID) | OPHTHALMIC | Status: DC
  Administered 2016-03-05: 1 via OPHTHALMIC
  Filled 2016-03-05: qty 3.5

## 2016-03-05 NOTE — ED Provider Notes (Signed)
[facility] Provider Note   CSN: [id number] Arrival date & time: 03/05/16  2153   By signing my name below, I, Bobbie Stack, attest that this documentation has been prepared under the direction and in the presence of Zadie Rhine, MD. Electronically Signed: Bobbie Stack, Scribe. 03/05/16. 11:31 PM. History   Chief Complaint Chief Complaint  Patient presents with  . Generalized Body Aches     The history is provided by the patient. No language interpreter was used.   HPI Comments: Tonya Harmon is a 26 y.o. female who presents to the Emergency Department complaining of a eye irritation, nausea, vomiting, body aches, and a subjective fever that began 2 to 3 days ago. Pt reports that 3 days ago she began to have an allergic reaction to some laundry detergent. She states that she took some benadryl which resulted in significant relief at that time. Patient reports that 2 days ago she started to develop eye discharge, fever, nausea, and vomiting. She denies sore throat and blood in vomit. Patient is currently 17 weeks into her pregnancy with no complications. She denies dysuria and vaginal discharge.   Past Medical History:  Diagnosis Date  . Anxiety   . Back pain   . Bipolar 1 disorder (HCC)   . Bipolar disorder (HCC)   . Common migraine with intractable migraine 06/08/2014  . Contraceptive management 10/05/2015  . Depression   . Epilepsy (HCC)   . GERD (gastroesophageal reflux disease)    pregnancy related-rolaids prn  . Headache(784.0)   . Insomnia   . Nexplanon in place 10/04/2015  . Pelvic pain in female 10/04/2015  . PONV (postoperative nausea and vomiting)   . Sciatica   . Seizures [facility])     Patient Active Problem List   Diagnosis Date Noted  . Asymptomatic bacteriuria during pregnancy in first trimester 02/07/2016  . Supervision of normal pregnancy 01/31/2016  . Pregnancy with history of cesarean section, antepartum 01/31/2016  . Seizures (HCC)  06/08/2014  . Common migraine with intractable migraine 06/08/2014  . Axillary mass 09/07/2013  . Cellulitis of axilla, left 09/07/2013  . Anxiety 05/06/2012  . Depression 05/06/2012    Past Surgical History:  Procedure Laterality Date  . CESAREAN SECTION  2011,2008  . CESAREAN SECTION N/A 05/18/2012   Procedure: CESAREAN SECTION;  Surgeon: Tilda Burrow, MD;  Location: [facility];  Service: Obstetrics;  Laterality: N/A;  . INCISION AND DRAINAGE ABSCESS Left 09/10/2013   Procedure: INCISION AND DRAINAGE ABSCESS, left breast/axilla;  Surgeon: Dalia Heading, MD;  Location: [facility];  Service: General;  Laterality: Left;    OB History    Gravida Para Term Preterm AB Living   4 3 3     3    SAB TAB Ectopic Multiple Live Births           3       Home Medications    Prior to Admission medications   Medication Sig Start Date End Date Taking? Authorizing Provider  acetaminophen (TYLENOL) 325 MG tablet Take 650 mg by mouth every 6 (six) hours as needed.    Historical Provider, MD  Doxylamine-Pyridoxine (DICLEGIS) 10-10 MG TBEC Take as directed 01/04/16   Adline Potter, NP  levETIRAcetam (KEPPRA) 500 MG tablet 500mg  once daily x 1 week, then 500mg  BID 01/31/16   Cheral Marker, CNM  nitrofurantoin, macrocrystal-monohydrate, (MACROBID) 100 MG capsule Take 1 capsule (100 mg total) by mouth 2 (two) times daily. X 7 days 02/09/16   Cala Bradford  Mady Gemma Booker, CNM  Prenatal Vit-Fe Fum-FA-Omega (ONE-A-DAY WOMENS PRENATAL PO) Take by mouth daily.    Historical Provider, MD    Family History Family History  Problem Relation Age of Onset  . Diabetes Mother   . Fibromyalgia Mother   . Rheum arthritis Mother   . Diabetes Other   . Hypertension Other   . Cancer Other   . Coronary artery disease Other   . Tremor Maternal Grandmother   . Cancer Maternal Grandmother     breast, uterine  . Alzheimer's disease Maternal Grandmother   . Stroke Maternal Grandmother   . Coronary artery disease Maternal  Grandfather   . Diabetes Maternal Grandfather   . Hypertension Maternal Grandfather   . Diabetes Paternal Grandmother   . Hypertension Paternal Grandmother   . Cancer Paternal Grandmother     breast  . Cancer Paternal Grandfather     pancreatic  . Hypertension Paternal Grandfather   . Seizures Neg Hx     Social History Social History  Substance Use Topics  . Smoking status: Former Smoker    Types: Cigarettes  . Smokeless tobacco: Never Used  . Alcohol use No     Allergies   Clonazepam; Tramadol; Flexeril [cyclobenzaprine hcl]; Naproxen; Prednisone; Trazodone and nefazodone; Cefixime; and Vistaril [hydroxyzine hcl]   Review of Systems Review of Systems  Constitutional: Positive for fever.  HENT: Negative for sore throat.   Eyes: Positive for discharge.  Respiratory: Negative for cough.   Gastrointestinal: Positive for vomiting.  Genitourinary: Negative for dysuria, vaginal bleeding and vaginal discharge.  All other systems reviewed and are negative.    Physical Exam Updated Vital Signs BP 114/68 (BP Location: Left Arm)   Pulse 107   Temp 98.2 F (36.8 C) (Oral)   Resp 16   Ht 5\' 5"  (1.651 m)   Wt 132 lb (59.9 kg)   LMP 11/03/2015 (Exact Date)   SpO2 98%   BMI 21.97 kg/m   Physical Exam CONSTITUTIONAL: Well developed/well nourished HEAD: Normocephalic/atraumatic  EYES: EOMI/PERRL. Bilateral conjunctival injection/erythema. Yellowish discharge is noted bilaterally. No proptosis ENMT: Mucous membranes moist, no angioedema NECK: supple no meningeal signs SPINE/BACK:entire spine nontender CV: S1/S2 noted, no murmurs/rubs/gallops noted LUNGS: Lungs are clear to auscultation bilaterally, no apparent distress ABDOMEN: soft, minimal diffuse tenderness, no rebound or guarding, bowel sounds noted throughout abdomen GU:no cva tenderness NEURO: Pt is awake/alert/appropriate, moves all extremitiesx4. EXTREMITIES: pulses normal/equal, full ROM SKIN: warm, color  normal, no urticaria PSYCH: no abnormalities of mood noted, alert and oriented to situation   ED Treatments / Results  DIAGNOSTIC STUDIES: Oxygen Saturation is 98% on RA, normal by my interpretation.    COORDINATION OF CARE: 11:31 PM Discussed treatment plan with pt at bedside and pt agreed to plan.  Labs (all labs ordered are listed, but only abnormal results are displayed) Labs Reviewed  URINALYSIS, ROUTINE W REFLEX MICROSCOPIC - Abnormal; Notable for the following:       Result Value   Color, Urine AMBER (*)    APPearance HAZY (*)    Bilirubin Urine SMALL (*)    Ketones, ur 20 (*)    Protein, ur 30 (*)    Nitrite POSITIVE (*)    Bacteria, UA FEW (*)    All other components within normal limits  URINE CULTURE    EKG  EKG Interpretation None       Radiology No results found.  Procedures Procedures (including critical care time)  Medications Ordered in ED Medications  erythromycin  ophthalmic ointment (1 application Both Eyes Given 03/05/16 2346)  acetaminophen (TYLENOL) tablet 650 mg (650 mg Oral Given 03/06/16 0116)     Initial Impression / Assessment and Plan / ED Course  I have reviewed the triage vital signs and the nursing notes.  Pertinent labs  results that were available during my care of the patient were reviewed by me and considered in my medical decision making (see chart for details).  Clinical Course     Pt well appearing Here for 2 issues - conjunctivitis and also nausea/vomiting and reported fever She is afebrile here She is nontoxic She is not septic appearing Her BP is low, but this has been present on previous OB exams She is less than 20 weeks (FHT noted per nursing) For her eyes - topical erythromycin ordered She does have evidence of recurrent UTI (recently completed therapy) Will restart macrobid (cef allergy noted) She needs to call her OB today for close followup She is also dehydrated She was given several cups of water to drink  here and she can ambulate   Proteinuria noted, but less than 20 weeks, no hypertension She can be monitored with her OB  Final Clinical Impressions(s) / ED Diagnoses   Final diagnoses:  Acute cystitis without hematuria  Acute conjunctivitis of both eyes, unspecified acute conjunctivitis type    New Prescriptions Discharge Medication List as of 03/06/2016  1:08 AM     I personally performed the services described in this documentation, which was scribed in my presence. The recorded information has been reviewed and is accurate.       Zadie Rhine, MD 03/06/16 [id number]

## 2016-03-05 NOTE — ED Triage Notes (Signed)
Pt c/o fever, eye irritation, n/v and body aches. Pt states she is [redacted] weeks pregnant.

## 2016-03-06 LAB — URINALYSIS, ROUTINE W REFLEX MICROSCOPIC
Glucose, UA: NEGATIVE mg/dL
Hgb urine dipstick: NEGATIVE
Ketones, ur: 20 mg/dL — AB
Leukocytes, UA: NEGATIVE
Nitrite: POSITIVE — AB
Protein, ur: 30 mg/dL — AB
Specific Gravity, Urine: 1.03 (ref 1.005–1.030)
pH: 6 (ref 5.0–8.0)

## 2016-03-06 MED ORDER — NITROFURANTOIN MONOHYD MACRO 100 MG PO CAPS: 100.0000 mg | ORAL_CAPSULE | Freq: Two times a day (BID) | ORAL | 0 refills | Status: DC

## 2016-03-06 MED ORDER — ACETAMINOPHEN 325 MG PO TABS
650.0000 mg | ORAL_TABLET | Freq: Once | ORAL | Status: AC
  Administered 2016-03-06: 650 mg via ORAL
  Filled 2016-03-06: qty 2

## 2016-03-07 LAB — URINE CULTURE

## 2016-03-13 ENCOUNTER — Ambulatory Visit (INDEPENDENT_AMBULATORY_CARE_PROVIDER_SITE_OTHER): Payer: Medicaid Other | Admitting: Women's Health

## 2016-03-13 ENCOUNTER — Encounter: Payer: Self-pay | Admitting: Women's Health

## 2016-03-13 ENCOUNTER — Ambulatory Visit (INDEPENDENT_AMBULATORY_CARE_PROVIDER_SITE_OTHER): Payer: Medicaid Other

## 2016-03-13 VITALS — BP 101/48 | HR 71 | Wt 134.0 lb

## 2016-03-13 DIAGNOSIS — R8271 Bacteriuria: Secondary | ICD-10-CM

## 2016-03-13 DIAGNOSIS — Z3482 Encounter for supervision of other normal pregnancy, second trimester: Secondary | ICD-10-CM

## 2016-03-13 DIAGNOSIS — Z3A19 19 weeks gestation of pregnancy: Secondary | ICD-10-CM

## 2016-03-13 DIAGNOSIS — O99012 Anemia complicating pregnancy, second trimester: Secondary | ICD-10-CM | POA: Diagnosis not present

## 2016-03-13 DIAGNOSIS — O99322 Drug use complicating pregnancy, second trimester: Secondary | ICD-10-CM

## 2016-03-13 DIAGNOSIS — Z1389 Encounter for screening for other disorder: Secondary | ICD-10-CM

## 2016-03-13 DIAGNOSIS — Z331 Pregnant state, incidental: Secondary | ICD-10-CM

## 2016-03-13 DIAGNOSIS — O9989 Other specified diseases and conditions complicating pregnancy, childbirth and the puerperium: Secondary | ICD-10-CM

## 2016-03-13 DIAGNOSIS — Z363 Encounter for antenatal screening for malformations: Secondary | ICD-10-CM | POA: Diagnosis not present

## 2016-03-13 DIAGNOSIS — O2342 Unspecified infection of urinary tract in pregnancy, second trimester: Secondary | ICD-10-CM

## 2016-03-13 DIAGNOSIS — Z3682 Encounter for antenatal screening for nuchal translucency: Secondary | ICD-10-CM

## 2016-03-13 DIAGNOSIS — F1910 Other psychoactive substance abuse, uncomplicated: Secondary | ICD-10-CM

## 2016-03-13 DIAGNOSIS — O34219 Maternal care for unspecified type scar from previous cesarean delivery: Secondary | ICD-10-CM

## 2016-03-13 LAB — POCT URINALYSIS DIPSTICK
Blood, UA: NEGATIVE
Glucose, UA: NEGATIVE
Ketones, UA: NEGATIVE
Nitrite, UA: NEGATIVE
Protein, UA: NEGATIVE

## 2016-03-13 LAB — POCT HEMOGLOBIN: Hemoglobin: 9.8 g/dL — AB (ref 12.2–16.2)

## 2016-03-13 MED ORDER — CITRANATAL ASSURE 35-1 & 300 MG PO MISC: ORAL | 11 refills | Status: DC

## 2016-03-13 MED ORDER — FERROUS SULFATE 325 (65 FE) MG PO TABS
325.0000 mg | ORAL_TABLET | Freq: Two times a day (BID) | ORAL | 3 refills | Status: DC
Start: 2016-03-13 — End: 2016-10-31

## 2016-03-13 NOTE — Progress Notes (Signed)
US 18+5 wks,measurements c/w dates,cephalic,cx 3.4 cm,post pl gr 0,bilat adnexa's wnl,svp of fluid 3.7 cm,fhr 149 bpm,efw 257 g,anatomy complete,no obvious abnormalities seen

## 2016-03-13 NOTE — Progress Notes (Signed)
Low-risk OB appointment [id number] [id number] Estimated Date of Delivery: 08/09/16 BP (!) 101/48   Pulse 71   Wt 134 lb (60.8 kg)   LMP 11/03/2015 (Exact Date)   BMI 22.30 kg/m   BP, weight, and urine reviewed.  Refer to obstetrical flow sheet for FH & FHR.  Reports good fm.  Denies regular uc's, lof, vb, or uti s/s. Went to ED 1/2 w/ pink eye, dx w/ another UTI, on macrobid- finished tomorrow. Will send urine cx for poc today.  Dizziness, fingerstick Hgb 9.8, rx fe bid, increase fe-rich foods Last UDS +for benzos- pt states she has a rx for xanax but doesn't remember taking any. Will repeat uds today.  Doing well on Keppra, no further seizures, hasn't had appt w/ neuro yet d/t no money for gas during holidays, states she will call and reschedule Wants BTL w/ repeat c/s Reviewed today's normal anatomy u/s, warning s/s to report, fm. Plan:  Continue routine obstetrical care  F/U in 4wks for OB appointment  2nd IT today

## 2016-03-13 NOTE — Patient Instructions (Addendum)
Second Trimester of Pregnancy The second trimester is from week 13 through week 28 (months 4 through 6). The second trimester is often a time when you feel your best. Your body has also adjusted to being pregnant, and you begin to feel better physically. Usually, morning sickness has lessened or quit completely, you may have more energy, and you may have an increase in appetite. The second trimester is also a time when the fetus is growing rapidly. At the end of the sixth month, the fetus is about 9 inches long and weighs about 1 pounds. You will likely begin to feel the baby move (quickening) between 18 and 20 weeks of the pregnancy. Body changes during your second trimester Your body continues to go through many changes during your second trimester. The changes vary from woman to woman.  Your weight will continue to increase. You will notice your lower abdomen bulging out.  You may begin to get stretch marks on your hips, abdomen, and breasts.  You may develop headaches that can be relieved by medicines. The medicines should be approved by your health care provider.  You may urinate more often because the fetus is pressing on your bladder.  You may develop or continue to have heartburn as a result of your pregnancy.  You may develop constipation because certain hormones are causing the muscles that push waste through your intestines to slow down.  You may develop hemorrhoids or swollen, bulging veins (varicose veins).  You may have back pain. This is caused by:  Weight gain.  Pregnancy hormones that are relaxing the joints in your pelvis.  A shift in weight and the muscles that support your balance.  Your breasts will continue to grow and they will continue to become tender.  Your gums may bleed and may be sensitive to brushing and flossing.  Dark spots or blotches (chloasma, mask of pregnancy) may develop on your face. This will likely fade after the baby is born.  A dark line  from your belly button to the pubic area (linea nigra) may appear. This will likely fade after the baby is born.  You may have changes in your hair. These can include thickening of your hair, rapid growth, and changes in texture. Some women also have hair loss during or after pregnancy, or hair that feels dry or thin. Your hair will most likely return to normal after your baby is born. What to expect at prenatal visits During a routine prenatal visit:  You will be weighed to make sure you and the fetus are growing normally.  Your blood pressure will be taken.  Your abdomen will be measured to track your baby's growth.  The fetal heartbeat will be listened to.  Any test results from the previous visit will be discussed. Your health care provider may ask you:  How you are feeling.  If you are feeling the baby move.  If you have had any abnormal symptoms, such as leaking fluid, bleeding, severe headaches, or abdominal cramping.  If you are using any tobacco products, including cigarettes, chewing tobacco, and electronic cigarettes.  If you have any questions. Other tests that may be performed during your second trimester include:  Blood tests that check for:  Low iron levels (anemia).  Gestational diabetes (between 24 and 28 weeks).  Rh antibodies. This is to check for a protein on red blood cells (Rh factor).  Urine tests to check for infections, diabetes, or protein in the urine.  An ultrasound to  confirm the proper growth and development of the baby.  An amniocentesis to check for possible genetic problems.  Fetal screens for spina bifida and Down syndrome.  HIV (human immunodeficiency virus) testing. Routine prenatal testing includes screening for HIV, unless you choose not to have this test. Follow these instructions at home: Eating and drinking  Continue to eat regular, healthy meals.  Avoid raw meat, uncooked cheese, cat litter boxes, and soil used by cats. These  carry germs that can cause birth defects in the baby.  Take your prenatal vitamins.  Take 1500-2000 mg of calcium daily starting at the 20th week of pregnancy until you deliver your baby.  If you develop constipation:  Take over-the-counter or prescription medicines.  Drink enough fluid to keep your urine clear or pale yellow.  Eat foods that are high in fiber, such as fresh fruits and vegetables, whole grains, and beans.  Limit foods that are high in fat and processed sugars, such as fried and sweet foods. Activity  Exercise only as directed by your health care provider. Experiencing uterine cramps is a good sign to stop exercising.  Avoid heavy lifting, wear low heel shoes, and practice good posture.  Wear your seat belt at all times when driving.  Rest with your legs elevated if you have leg cramps or low back pain.  Wear a good support bra for breast tenderness.  Do not use hot tubs, steam rooms, or saunas. Lifestyle  Avoid all smoking, herbs, alcohol, and unprescribed drugs. These chemicals affect the formation and growth of the baby.  Do not use any products that contain nicotine or tobacco, such as cigarettes and e-cigarettes. If you need help quitting, ask your health care provider.  A sexual relationship may be continued unless your health care provider directs you otherwise. General instructions  Follow your health care provider's instructions regarding medicine use. There are medicines that are either safe or unsafe to take during pregnancy.  Take warm sitz baths to soothe any pain or discomfort caused by hemorrhoids. Use hemorrhoid cream if your health care provider approves.  If you develop varicose veins, wear support hose. Elevate your feet for 15 minutes, 3-4 times a day. Limit salt in your diet.  Visit your dentist if you have not gone yet during your pregnancy. Use a soft toothbrush to brush your teeth and be gentle when you floss.  Keep all follow-up  prenatal visits as told by your health care provider. This is important. Contact a health care provider if:  You have dizziness.  You have mild pelvic cramps, pelvic pressure, or nagging pain in the abdominal area.  You have persistent nausea, vomiting, or diarrhea.  You have a bad smelling vaginal discharge.  You have pain with urination. Get help right away if:  You have a fever.  You are leaking fluid from your vagina.  You have spotting or bleeding from your vagina.  You have severe abdominal cramping or pain.  You have rapid weight gain or weight loss.  You have shortness of breath with chest pain.  You notice sudden or extreme swelling of your face, hands, ankles, feet, or legs.  You have not felt your baby move in over an hour.  You have severe headaches that do not go away with medicine.  You have vision changes. Summary  The second trimester is from week 13 through week 28 (months 4 through 6). It is also a time when the fetus is growing rapidly.  Your body goes  through many changes during pregnancy. The changes vary from woman to woman.  Avoid all smoking, herbs, alcohol, and unprescribed drugs. These chemicals affect the formation and growth your baby.  Do not use any tobacco products, such as cigarettes, chewing tobacco, and e-cigarettes. If you need help quitting, ask your health care provider.  Contact your health care provider if you have any questions. Keep all prenatal visits as told by your health care provider. This is important. This information is not intended to replace advice given to you by your health care provider. Make sure you discuss any questions you have with your health care provider. Document Released: 02/12/2001 Document Revised: 07/27/2015 Document Reviewed: 04/21/2012 Elsevier Interactive Patient Education  2017 [place].   Iron-Rich Diet Introduction Iron is a mineral that helps your body to produce hemoglobin. Hemoglobin is  a protein in your red blood cells that carries oxygen to your body's tissues. Eating too little iron may cause you to feel weak and tired, and it can increase your risk for infection. Eating enough iron is necessary for your body's metabolism, muscle function, and nervous system. Iron is naturally found in many foods. It can also be added to foods or fortified in foods. There are two types of dietary iron:  Heme iron. Heme iron is absorbed by the body more easily than nonheme iron. Heme iron is found in meat, poultry, and fish.  Nonheme iron. Nonheme iron is found in dietary supplements, iron-fortified grains, beans, and vegetables. You may need to follow an iron-rich diet if:  You have been diagnosed with iron deficiency or iron-deficiency anemia.  You have a condition that prevents you from absorbing dietary iron, such as:  Infection in your intestines.  Celiac disease. This involves long-lasting (chronic) inflammation of your intestines.  You do not eat enough iron.  You eat a diet that is high in foods that impair iron absorption.  You have lost a lot of blood.  You have heavy bleeding during your menstrual cycle.  You are pregnant. What is my plan? Your health care provider may help you to determine how much iron you need per day based on your condition. Generally, when a person consumes sufficient amounts of iron in the diet, the following iron needs are met:  Men.  69-26 years old: 11 mg per day.  62-61 years old: 8 mg per day.  Women.  13-63 years old: 15 mg per day.  31-48 years old: 18 mg per day.  Over 2 years old: 8 mg per day.  Pregnant women: 27 mg per day.  Breastfeeding women: 9 mg per day. What do I need to know about an iron-rich diet?  Eat fresh fruits and vegetables that are high in vitamin C along with foods that are high in iron. This will help increase the amount of iron that your body absorbs from food, especially with foods containing nonheme  iron. Foods that are high in vitamin C include oranges, peppers, tomatoes, and mango.  Take iron supplements only as directed by your health care provider. Overdose of iron can be life-threatening. If you were prescribed iron supplements, take them with orange juice or a vitamin C supplement.  Cook foods in pots and pans that are made from iron.  Eat nonheme iron-containing foods alongside foods that are high in heme iron. This helps to improve your iron absorption.  Certain foods and drinks contain compounds that impair iron absorption. Avoid eating these foods in the same meal as iron-rich  foods or with iron supplements. These include:  Coffee, black tea, and red wine.  Milk, dairy products, and foods that are high in calcium.  Beans, soybeans, and peas.  Whole grains.  When eating foods that contain both nonheme iron and compounds that impair iron absorption, follow these tips to absorb iron better.  Soak beans overnight before cooking.  Soak whole grains overnight and drain them before using.  Ferment flours before baking, such as using yeast in bread dough. What foods can I eat? Grains  Iron-fortified breakfast cereal. Iron-fortified whole-wheat bread. Enriched rice. Sprouted grains. Vegetables  Spinach. Potatoes with skin. Green peas. Broccoli. Red and green bell peppers. Fermented vegetables. Fruits  Prunes. Raisins. Oranges. Strawberries. Mango. Grapefruit. Meats and Other Protein Sources  Beef liver. Oysters. Beef. Shrimp. [place]. Chicken. [place]. Sardines. Chickpeas. Nuts. Tofu. Beverages  Tomato juice. Fresh orange juice. Prune juice. Hibiscus tea. Fortified instant breakfast shakes. Condiments  Tahini. Fermented soy sauce. Sweets and Desserts  Black-strap molasses. Other  Wheat germ. The items listed above may not be a complete list of recommended foods or beverages. Contact your dietitian for more options.  What foods are not recommended? Grains  Whole grains.  Bran cereal. Bran flour. Oats. Vegetables  Artichokes. Brussels sprouts. Kale. Fruits  Blueberries. Raspberries. Strawberries. Figs. Meats and Other Protein Sources  Soybeans. Products made from soy protein. Dairy  Milk. Cream. Cheese. Yogurt. Cottage cheese. Beverages  Coffee. Black tea. Red wine. Sweets and Desserts  Cocoa. Chocolate. Ice cream. Other  Basil. Oregano. Parsley. The items listed above may not be a complete list of foods and beverages to avoid. Contact your dietitian for more information.  This information is not intended to replace advice given to you by your health care provider. Make sure you discuss any questions you have with your health care provider. Document Released: 10/02/2004 Document Revised: 09/08/2015 Document Reviewed: 09/15/2013  2017 Elsevier

## 2016-03-13 NOTE — Addendum Note (Signed)
Addended by: Gaylyn RongEVANS, Catelin Manthe A on: 03/13/2016 09:59 AM   Modules accepted: Orders

## 2016-03-14 ENCOUNTER — Encounter: Payer: Self-pay | Admitting: Women's Health

## 2016-03-14 DIAGNOSIS — F1910 Other psychoactive substance abuse, uncomplicated: Secondary | ICD-10-CM | POA: Insufficient documentation

## 2016-03-14 LAB — PMP SCREEN PROFILE (10S), URINE
Amphetamine Screen, Ur: NEGATIVE ng/mL
Barbiturate Screen, Ur: NEGATIVE ng/mL
Benzodiazepine Screen, Urine: NEGATIVE ng/mL
Cannabinoids Ur Ql Scn: NEGATIVE ng/mL
Cocaine(Metab.)Screen, Urine: NEGATIVE ng/mL
Creatinine(Crt), U: 44.7 mg/dL (ref 20.0–300.0)
Methadone Scn, Ur: NEGATIVE ng/mL
Opiate Scrn, Ur: NEGATIVE ng/mL
Oxycodone+Oxymorphone Ur Ql Scn: NEGATIVE ng/mL
PCP Scrn, Ur: NEGATIVE ng/mL
Ph of Urine: 5.7 (ref 4.5–8.9)
Propoxyphene, Screen: NEGATIVE ng/mL

## 2016-03-15 LAB — URINE CULTURE

## 2016-03-17 LAB — MATERNAL SCREEN, INTEGRATED #2
AFP MoM: 1.03
Alpha-Fetoprotein: 52.1 ng/mL
Crown Rump Length: 73.1 mm
DIA MoM: 1.21
DIA Value: 247 pg/mL
Estriol, Unconjugated: 1.23 ng/mL
Gest. Age on Collection Date: 13.1 weeks
Gestational Age: 19 weeks
Maternal Age at EDD: 27.8 years
Nuchal Translucency (NT): 1.4 mm
Nuchal Translucency MoM: 0.78
Number of Fetuses: 1
PAPP-A MoM: 0.53
PAPP-A Value: 749.8 ng/mL
Test Results:: NEGATIVE
Weight: 130 [lb_av]
Weight: 130 [lb_av]
hCG MoM: 0.41
hCG Value: 10 IU/mL
uE3 MoM: 0.77

## 2016-04-10 ENCOUNTER — Encounter: Payer: Self-pay | Admitting: Advanced Practice Midwife

## 2016-04-10 ENCOUNTER — Ambulatory Visit (INDEPENDENT_AMBULATORY_CARE_PROVIDER_SITE_OTHER): Payer: Medicaid Other | Admitting: Advanced Practice Midwife

## 2016-04-10 VITALS — BP 90/60 | HR 72 | Wt 137.4 lb

## 2016-04-10 DIAGNOSIS — Z3A23 23 weeks gestation of pregnancy: Secondary | ICD-10-CM

## 2016-04-10 DIAGNOSIS — Z331 Pregnant state, incidental: Secondary | ICD-10-CM

## 2016-04-10 DIAGNOSIS — Z1389 Encounter for screening for other disorder: Secondary | ICD-10-CM

## 2016-04-10 DIAGNOSIS — Z3482 Encounter for supervision of other normal pregnancy, second trimester: Secondary | ICD-10-CM

## 2016-04-10 LAB — POCT URINALYSIS DIPSTICK
Blood, UA: NEGATIVE
Glucose, UA: NEGATIVE
Ketones, UA: NEGATIVE
Leukocytes, UA: NEGATIVE
Nitrite, UA: NEGATIVE
Protein, UA: NEGATIVE

## 2016-04-10 MED ORDER — NITROFURANTOIN MONOHYD MACRO 100 MG PO CAPS: 100.0000 mg | ORAL_CAPSULE | Freq: Every day | ORAL | 5 refills | Status: DC

## 2016-04-10 MED ORDER — ZOLPIDEM TARTRATE 10 MG PO TABS: 10.0000 mg | ORAL_TABLET | Freq: Every evening | ORAL | 3 refills | Status: DC | PRN

## 2016-04-10 NOTE — Progress Notes (Signed)
[id number] 4675w5d Estimated Date of Delivery: 08/09/16  Blood pressure 90/60, pulse 72, weight 137 lb 6.4 oz (62.3 kg), last menstrual period 11/03/2015.   BP weight and urine results all reviewed and noted.  Please refer to the obstetrical flow sheet for the fundal height and fetal heart rate documentation:  Patient reports good fetal movement, denies any bleeding and no rupture of membranes symptoms or regular contractions. Patient requests ambien rx.  Rx 10mg , break in 1/2 She has not been see her neurologist.  Wants to get in w/[facility].  Pt strongly advised to do this.  All questions were answered.  Orders Placed This Encounter  Procedures  . POCT urinalysis dipstick    Plan:  Continued routine obstetrical care, seen  Return in about 4 weeks (around 05/08/2016) for PN2/LROB.

## 2016-04-10 NOTE — Patient Instructions (Addendum)
1. Before your test, do not eat or drink anything for 8-10 hours prior to your  appointment (a small amount of water is allowed and you may take any medicines you normally take). Be sure to drink lots of water the day before. 2. When you arrive, your blood will be drawn for a 'fasting' blood sugar level.  Then you will be given a sweetened carbonated beverage to drink. You should  complete drinking this beverage within five minutes. After finishing the  beverage, you will have your blood drawn exactly 1 and 2 hours later. Having  your blood drawn on time is an important part of this test. A total of three blood  samples will be done. 3. The test takes approximately 2  hours. During the test, do not have anything to  eat or drink. Do not smoke, chew gum (not even sugarless gum) or use breath mints.  4. During the test you should remain close by and seated as much as possible and  avoid walking around. You may want to bring a book or something else to  occupy your time.  5. After your test, you may eat and drink as normal. You may want to bring a snack  to eat after the test is finished. Your provider will advise you as to the results of  this test and any follow-up if necessary  If your sugar test is positive for gestational diabetes, you will be given an phone call and further instructions discussed. If you wish to know all of your test results before your next appointment, feel free to call the office, or look up your test results on Mychart.  (The range that the lab uses for normal values of the sugar test are not necessarily the range that is used for pregnant women; if your results are within the normal range, they are definitely normal.  However, if a value is deemed "high" by the lab, it may not be too high for a pregnant woman.  We will need to discuss the results if your value(s) fall in the "high" category).     Tdap Vaccine  It is recommended that you get the Tdap vaccine during the  third trimester of EACH pregnancy to help protect your baby from getting pertussis (whooping cough)  27-36 weeks is the BEST time to do this so that you can pass the protection on to your baby. During pregnancy is better than after pregnancy, but if you are unable to get it during pregnancy it will be offered at the hospital.  You can get this vaccine at the health department or your family doctor, as well as some pharmacies.  Everyone who will be around your baby should also be up-to-date on their vaccines. Adults (who are not pregnant) only need 1 dose of Tdap during adulthood.     Dr Gerilyn [place] Neurologist

## 2016-05-08 ENCOUNTER — Ambulatory Visit (INDEPENDENT_AMBULATORY_CARE_PROVIDER_SITE_OTHER): Payer: Medicaid Other | Admitting: Advanced Practice Midwife

## 2016-05-08 ENCOUNTER — Ambulatory Visit (INDEPENDENT_AMBULATORY_CARE_PROVIDER_SITE_OTHER): Payer: Medicaid Other

## 2016-05-08 ENCOUNTER — Other Ambulatory Visit: Payer: Medicaid Other

## 2016-05-08 ENCOUNTER — Encounter: Payer: Self-pay | Admitting: Advanced Practice Midwife

## 2016-05-08 VITALS — BP 92/58 | HR 86 | Wt 138.0 lb

## 2016-05-08 DIAGNOSIS — Z131 Encounter for screening for diabetes mellitus: Secondary | ICD-10-CM

## 2016-05-08 DIAGNOSIS — O26842 Uterine size-date discrepancy, second trimester: Secondary | ICD-10-CM

## 2016-05-08 DIAGNOSIS — Z3A27 27 weeks gestation of pregnancy: Secondary | ICD-10-CM | POA: Diagnosis not present

## 2016-05-08 DIAGNOSIS — O321XX1 Maternal care for breech presentation, fetus 1: Secondary | ICD-10-CM | POA: Diagnosis not present

## 2016-05-08 DIAGNOSIS — Z3482 Encounter for supervision of other normal pregnancy, second trimester: Secondary | ICD-10-CM

## 2016-05-08 DIAGNOSIS — F1910 Other psychoactive substance abuse, uncomplicated: Secondary | ICD-10-CM

## 2016-05-08 DIAGNOSIS — Z3402 Encounter for supervision of normal first pregnancy, second trimester: Secondary | ICD-10-CM

## 2016-05-08 DIAGNOSIS — Z331 Pregnant state, incidental: Secondary | ICD-10-CM

## 2016-05-08 DIAGNOSIS — Z3A26 26 weeks gestation of pregnancy: Secondary | ICD-10-CM

## 2016-05-08 DIAGNOSIS — Z1389 Encounter for screening for other disorder: Secondary | ICD-10-CM

## 2016-05-08 DIAGNOSIS — O99322 Drug use complicating pregnancy, second trimester: Secondary | ICD-10-CM

## 2016-05-08 LAB — POCT URINALYSIS DIPSTICK
Blood, UA: NEGATIVE
Glucose, UA: NEGATIVE
Ketones, UA: NEGATIVE
Nitrite, UA: NEGATIVE
Protein, UA: NEGATIVE

## 2016-05-08 NOTE — Progress Notes (Signed)
US 26+5 wks,breech,cx 3.1 cm,post pl gr 1,normal ov's bilat,svp of fluid 4.3 cm,efw 972 g, 50%

## 2016-05-08 NOTE — Progress Notes (Addendum)
[id number] 5275w5d Estimated Date of Delivery: 08/09/16  Blood pressure (!) 92/58, pulse 86, weight 138 lb (62.6 kg), last menstrual period 11/03/2015.   BP weight and urine results all reviewed and noted.  Please refer to the obstetrical flow sheet for the fundal height and fetal heart rate documentation: size < dates.  BTL papers signed today   Patient reports good fetal movement, denies any bleeding and no rupture of membranes symptoms or regular contractions.  Having BH and some pressure for a few weeks not. PTL precautions discussed.  Try maternity belt Patient is without complaints other than normal pregnancy complaints All questions were answered.  Orders Placed This Encounter  Procedures  . US OB Follow Up  . Pain Management Screening Profile (10S)  . POCT Urinalysis Dipstick    Plan:  Continued routine obstetrical care, PN2 today  Return for within 1 week for EFW/AFI and 4 weeks for LROB.

## 2016-05-09 LAB — PMP SCREEN PROFILE (10S), URINE
Amphetamine Screen, Ur: NEGATIVE ng/mL
Barbiturate Screen, Ur: NEGATIVE ng/mL
Benzodiazepine Screen, Urine: NEGATIVE ng/mL
Cannabinoids Ur Ql Scn: NEGATIVE ng/mL
Cocaine(Metab.)Screen, Urine: NEGATIVE ng/mL
Creatinine(Crt), U: 95.2 mg/dL (ref 20.0–300.0)
Methadone Scn, Ur: NEGATIVE ng/mL
Opiate Scrn, Ur: NEGATIVE ng/mL
Oxycodone+Oxymorphone Ur Ql Scn: NEGATIVE ng/mL
PCP Scrn, Ur: NEGATIVE ng/mL
Ph of Urine: 6.4 (ref 4.5–8.9)
Propoxyphene, Screen: NEGATIVE ng/mL

## 2016-05-09 LAB — GLUCOSE TOLERANCE, 2 HOURS W/ 1HR
Glucose, 1 hour: 100 mg/dL (ref 65–179)
Glucose, 2 hour: 88 mg/dL (ref 65–152)
Glucose, Fasting: 76 mg/dL (ref 65–91)

## 2016-05-09 LAB — CBC
Hematocrit: 33.6 % — ABNORMAL LOW (ref 34.0–46.6)
Hemoglobin: 11 g/dL — ABNORMAL LOW (ref 11.1–15.9)
MCH: 30.1 pg (ref 26.6–33.0)
MCHC: 32.7 g/dL (ref 31.5–35.7)
MCV: 92 fL (ref 79–97)
Platelets: 246 10*3/uL (ref 150–379)
RBC: 3.65 x10E6/uL — ABNORMAL LOW (ref 3.77–5.28)
RDW: 13.9 % (ref 12.3–15.4)
WBC: 9.5 10*3/uL (ref 3.4–10.8)

## 2016-05-09 LAB — ANTIBODY SCREEN: Antibody Screen: NEGATIVE

## 2016-05-09 LAB — RPR: RPR Ser Ql: NONREACTIVE

## 2016-05-09 LAB — HIV ANTIBODY (ROUTINE TESTING W REFLEX): HIV Screen 4th Generation wRfx: NONREACTIVE

## 2016-05-15 ENCOUNTER — Other Ambulatory Visit: Payer: Medicaid Other

## 2016-06-05 ENCOUNTER — Encounter: Payer: Self-pay | Admitting: Women's Health

## 2016-06-05 ENCOUNTER — Ambulatory Visit (INDEPENDENT_AMBULATORY_CARE_PROVIDER_SITE_OTHER): Payer: Medicaid Other | Admitting: Women's Health

## 2016-06-05 VITALS — BP 96/58 | HR 80 | Wt 145.0 lb

## 2016-06-05 DIAGNOSIS — Z1389 Encounter for screening for other disorder: Secondary | ICD-10-CM

## 2016-06-05 DIAGNOSIS — R569 Unspecified convulsions: Secondary | ICD-10-CM

## 2016-06-05 DIAGNOSIS — Z331 Pregnant state, incidental: Secondary | ICD-10-CM

## 2016-06-05 DIAGNOSIS — Z3483 Encounter for supervision of other normal pregnancy, third trimester: Secondary | ICD-10-CM

## 2016-06-05 DIAGNOSIS — O99323 Drug use complicating pregnancy, third trimester: Secondary | ICD-10-CM

## 2016-06-05 LAB — POCT URINALYSIS DIPSTICK
Blood, UA: NEGATIVE
Glucose, UA: NEGATIVE
Ketones, UA: NEGATIVE
Leukocytes, UA: NEGATIVE
Nitrite, UA: NEGATIVE
Protein, UA: NEGATIVE

## 2016-06-05 MED ORDER — AMOXICILLIN 500 MG PO CAPS: 500.0000 mg | ORAL_CAPSULE | Freq: Three times a day (TID) | ORAL | 0 refills | Status: DC

## 2016-06-05 NOTE — Progress Notes (Signed)
Low-risk OB appointment [id number] [redacted]w[redacted]d Estimated Date of Delivery: 08/09/16 BP (!) 96/58   Pulse 80   Wt 145 lb (65.8 kg)   LMP 11/03/2015 (Exact Date)   BMI 24.13 kg/m   BP, weight, and urine reviewed.  Refer to obstetrical flow sheet for FH & FHR.  Reports good fm.  Denies regular uc's, lof, vb, or uti s/s. Tickbite Lt lower abd, pulled tick off ~3wks ago, area turned red>dark red>blue>red, itches, had fever of 101.3 5d later x 1, some headaches and dizziness. ~2cm fine outer ring, small red center w/ 2 red dots on periphery. Co-exam w/ [facility], states would go ahead and tx for Lyme. Rx amoxicillin TID x 14d.  Has still not had neuro appt, states has appt w/ Dr. Sudie Bailey next week so he can send referral. NO seizures at all while on Keppra.  Reviewed pn2 results, ptl s/s, fkc. Plan:  Continue routine obstetrical care  F/U in 2wks for OB appointment

## 2016-06-05 NOTE — Patient Instructions (Signed)
Call the office [phone]) or go to [facility] if:  You begin to have strong, frequent contractions  Your water breaks.  Sometimes it is a big gush of fluid, sometimes it is just a trickle that keeps getting your panties wet or running down your legs  You have vaginal bleeding.  It is normal to have a small amount of spotting if your cervix was checked.   You don't feel your baby moving like normal.  If you don't, get you something to eat and drink and lay down and focus on feeling your baby move.  You should feel at least 10 movements in 2 hours.  If you don't, you should call the office or go to [facility].    Tdap Vaccine  It is recommended that you get the Tdap vaccine during the third trimester of EACH pregnancy to help protect your baby from getting pertussis (whooping cough)  27-36 weeks is the BEST time to do this so that you can pass the protection on to your baby. During pregnancy is better than after pregnancy, but if you are unable to get it during pregnancy it will be offered at the hospital.   You can get this vaccine at the health department or your family doctor  Everyone who will be around your baby should also be up-to-date on their vaccines. Adults (who are not pregnant) only need 1 dose of Tdap during adulthood.    Lyme Disease Lyme disease is an infection that affects many parts of the body, including the skin, joints, and nervous system. It is a bacterial infection that starts from the bite of an infected tick. The infection can spread, and some of the symptoms are similar to the flu. If Lyme disease is not treated, it may cause joint pain, swelling, numbness, problems thinking, fatigue, muscle weakness, and other problems. What are the causes? This condition is caused by bacteria called Borrelia burgdorferi. You can get Lyme disease by being bitten by an infected tick. The tick must be attached to your skin to pass along the infection. Deer often carry  infected ticks. What increases the risk? The following factors may make you more likely to develop this condition:  Living in or visiting these areas in the [place]  [place].  The [place].  The [place].  Spending time in wooded or grassy areas.  Being outdoors with exposed skin.  Camping, gardening, hiking, fishing, or hunting outdoors.  Failing to remove a tick from your skin within 3-4 days. What are the signs or symptoms? Symptoms of this condition include:  A round, red rash that surrounds the center of the tick bite. This is the first sign of infection. The center of the rash may be blood colored or have tiny blisters.  Fatigue.  Headache.  Chills and fever.  General achiness.  Joint pain, often in the knees.  Muscle pain.  Swollen lymph glands.  Stiff neck. How is this diagnosed? This condition is diagnosed based on:  Your symptoms and medical history.  A physical exam.  A blood test. How is this treated? The main treatment for this condition is antibiotic medicine, which is usually taken by mouth (orally). The length of treatment depends on how soon after a tick bite you begin taking the medicine. In some cases, treatment is necessary for several weeks. If the infection is severe, antibiotics may need to be given through an IV tube that is inserted into one of your veins. Follow these  instructions at home:  Take your antibiotic medicine as told by your health care provider. Do not stop taking the antibiotic even if you start to feel better.  Ask your health care provider about takinga probiotic in between doses of your antibiotic to help avoid stomach upset or diarrhea.  Check with your health care provider before supplementing your treatment. Many alternative therapies have not been proven and may be harmful to you.  Keep all follow-up visits as told by your health care provider. This is important. How is this prevented? You can  become reinfected if you get another tick bite from an infected tick. Take these steps to help prevent an infection:  Cover your skin with light-colored clothing when you are outdoors in the spring and summer months.  Spray clothing and skin with bug spray. The spray should be 20-30% DEET.  Avoid wooded, grassy, and shaded areas.  Remove yard litter, brush, trash, and plants that attract deer and rodents.  Check yourself for ticks when you come indoors.  Wash clothing worn each day.  Check your pets for ticks before they come inside.  If you find a tick:  Remove it with tweezers.  Clean your hands and the bite area with rubbing alcohol or soap and water. Pregnant women should take special care to avoid tick bites because the infection can be passed along to the fetus. Contact a health care provider if:  You have symptoms after treatment.  You have removed a tick and want to bring it to your health care provider for testing. Get help right away if:  You have an irregular heartbeat.  You have nerve pain.  Your face feels numb. This information is not intended to replace advice given to you by your health care provider. Make sure you discuss any questions you have with your health care provider. Document Released: 05/27/2000 Document Revised: 10/10/2015 Document Reviewed: 10/10/2015 Elsevier Interactive Patient Education  2017 [employer].

## 2016-06-19 ENCOUNTER — Encounter: Payer: Self-pay | Admitting: Obstetrics and Gynecology

## 2016-06-19 ENCOUNTER — Ambulatory Visit (INDEPENDENT_AMBULATORY_CARE_PROVIDER_SITE_OTHER): Payer: Medicaid Other | Admitting: Obstetrics and Gynecology

## 2016-06-19 VITALS — BP 102/62 | HR 72 | Temp 98.1°F | Wt 137.6 lb

## 2016-06-19 DIAGNOSIS — O368130 Decreased fetal movements, third trimester, not applicable or unspecified: Secondary | ICD-10-CM | POA: Diagnosis not present

## 2016-06-19 DIAGNOSIS — G43019 Migraine without aura, intractable, without status migrainosus: Secondary | ICD-10-CM

## 2016-06-19 DIAGNOSIS — Z331 Pregnant state, incidental: Secondary | ICD-10-CM

## 2016-06-19 DIAGNOSIS — O34219 Maternal care for unspecified type scar from previous cesarean delivery: Secondary | ICD-10-CM

## 2016-06-19 DIAGNOSIS — Z3483 Encounter for supervision of other normal pregnancy, third trimester: Secondary | ICD-10-CM

## 2016-06-19 DIAGNOSIS — Z1389 Encounter for screening for other disorder: Secondary | ICD-10-CM

## 2016-06-19 DIAGNOSIS — Z3A32 32 weeks gestation of pregnancy: Secondary | ICD-10-CM | POA: Diagnosis not present

## 2016-06-19 LAB — POCT URINALYSIS DIPSTICK
Glucose, UA: NEGATIVE
Ketones, UA: NEGATIVE
Nitrite, UA: NEGATIVE
Protein, UA: NEGATIVE

## 2016-06-19 NOTE — Progress Notes (Signed)
[id number]  Estimated Date of Delivery: 08/09/16 [facility] [redacted]w[redacted]d  Chief Complaint  Patient presents with  . Routine Prenatal Visit    pulled 2 more ticks off, cold sweats, still on medication for lyme dz, fever X 2 days, decreased FM  ____  Patient reports decreased fetal movement,                           denies any bleeding , rupture of membranes,or regular contractions.  Blood pressure 102/62, pulse 72, temperature 98.1 F (36.7 C), weight 137 lb 9.6 oz (62.4 kg), last menstrual period 11/03/2015.   Urine results:notable for trace blood and 1+ leukocytes  refer to the ob flow sheet for FH and FHR, ,                          Physical Examination: General appearance - alert, well appearing, and in no distress                                      Abdomen - FH 31 ,                                                         -FHR 150 with accelerations to 170                                                         soft, nontender, nondistended, no masses or organomegaly                                      Pelvic - examination not indicated                                            Questions were answered. Assessment: LROB [id number] @ [redacted]w[redacted]d, Estimated Date of Delivery: 08/09/16 Decreased fetal movement with reactive NST  Plan:  Continued routine obstetrical care, desires BTL while in hospital for repeat cesarean section  F/u in 2 weeks for LROB   By signing my name below, I, Sonum Patel, attest that this documentation has been prepared under the direction and in the presence of Tilda Burrow, MD. Electronically Signed: Sonum Patel, Neurosurgeon. 06/19/16. 10:16 AM.  I personally performed the services described in this documentation, which was SCRIBED in my presence. The recorded information has been reviewed and considered accurate. It has been edited as necessary during review. Tilda Burrow, MD

## 2016-06-20 LAB — PMP SCREEN PROFILE (10S), URINE
Amphetamine Screen, Ur: NEGATIVE ng/mL
Barbiturate Screen, Ur: NEGATIVE ng/mL
Benzodiazepine Screen, Urine: NEGATIVE ng/mL
Cannabinoids Ur Ql Scn: NEGATIVE ng/mL
Cocaine(Metab.)Screen, Urine: NEGATIVE ng/mL
Creatinine(Crt), U: 60.9 mg/dL (ref 20.0–300.0)
Methadone Scn, Ur: NEGATIVE ng/mL
Opiate Scrn, Ur: NEGATIVE ng/mL
Oxycodone+Oxymorphone Ur Ql Scn: NEGATIVE ng/mL
PCP Scrn, Ur: NEGATIVE ng/mL
Ph of Urine: 5.9 (ref 4.5–8.9)
Propoxyphene, Screen: NEGATIVE ng/mL

## 2016-06-25 ENCOUNTER — Telehealth: Payer: Self-pay | Admitting: Obstetrics and Gynecology

## 2016-06-25 NOTE — Telephone Encounter (Signed)
Tonya Harmon called stating that Dr. Emelda Fear was suppose to send her a mychart message giving her a date and time for her induction. Tonya Harmon states that her mychart is not working. I let patient know that Dr. Emelda Fear was out and wont be in until next week. Tonya Harmon would like for a nurse to give a call back.

## 2016-06-25 NOTE — Telephone Encounter (Signed)
Patient called stating Dr Emelda Fear was going to schedule her c/s but never called her back. I informed patient that her c/s was not scheduled and usually would not be this far out. Patient verbalized understanding and can discuss with provider at next visit.

## 2016-07-03 ENCOUNTER — Encounter: Payer: Self-pay | Admitting: Advanced Practice Midwife

## 2016-07-03 ENCOUNTER — Ambulatory Visit (INDEPENDENT_AMBULATORY_CARE_PROVIDER_SITE_OTHER): Payer: Medicaid Other | Admitting: Advanced Practice Midwife

## 2016-07-03 VITALS — BP 98/60 | HR 80 | Wt 144.4 lb

## 2016-07-03 DIAGNOSIS — Z3483 Encounter for supervision of other normal pregnancy, third trimester: Secondary | ICD-10-CM

## 2016-07-03 DIAGNOSIS — Z1389 Encounter for screening for other disorder: Secondary | ICD-10-CM

## 2016-07-03 DIAGNOSIS — Z331 Pregnant state, incidental: Secondary | ICD-10-CM

## 2016-07-03 DIAGNOSIS — O34219 Maternal care for unspecified type scar from previous cesarean delivery: Secondary | ICD-10-CM

## 2016-07-03 DIAGNOSIS — R569 Unspecified convulsions: Secondary | ICD-10-CM

## 2016-07-03 LAB — POCT URINALYSIS DIPSTICK
Blood, UA: NEGATIVE
Glucose, UA: NEGATIVE
Ketones, UA: NEGATIVE
Leukocytes, UA: NEGATIVE
Nitrite, UA: NEGATIVE
Protein, UA: NEGATIVE

## 2016-07-03 NOTE — Progress Notes (Signed)
error 

## 2016-07-03 NOTE — Progress Notes (Signed)
[id number] [redacted]w[redacted]d Estimated Date of Delivery: 08/09/16  Blood pressure 98/60, pulse 80, weight 144 lb 6.4 oz (65.5 kg), last menstrual period 11/03/2015.   BP weight and urine results all reviewed and noted.  Please refer to the obstetrical flow sheet for the fundal height and fetal heart rate documentation:  Patient reports good fetal movement, denies any bleeding and no rupture of membranes symptoms or regular contractions. Patient is without complaints. All questions were answered.  Orders Placed This Encounter  Procedures  . POCT urinalysis dipstick    Plan:  Continued routine obstetrical care, Get CS scheduled (pt states that Dr. Emelda Fear was going to send her a mychart message w/date but has not heard yet). Prefers JVF but either MD would be OK.   Return in about 2 weeks (around 07/17/2016) for LROB.

## 2016-07-03 NOTE — Patient Instructions (Signed)

## 2016-07-08 ENCOUNTER — Telehealth: Payer: Self-pay | Admitting: Advanced Practice Midwife

## 2016-07-08 NOTE — Telephone Encounter (Signed)
Attempted to call patient- Mailbox full 

## 2016-07-09 ENCOUNTER — Telehealth: Payer: Self-pay | Admitting: *Deleted

## 2016-07-09 NOTE — Telephone Encounter (Signed)
Informed patient of scheduled c/s for June 1 @ 10:15. Advised someone from [facility] would call her closer to her surgery date for preop appointment . Pt verbalized understanding.

## 2016-07-17 ENCOUNTER — Ambulatory Visit (INDEPENDENT_AMBULATORY_CARE_PROVIDER_SITE_OTHER): Payer: Medicaid Other | Admitting: Women's Health

## 2016-07-17 ENCOUNTER — Encounter: Payer: Self-pay | Admitting: Women's Health

## 2016-07-17 ENCOUNTER — Ambulatory Visit (INDEPENDENT_AMBULATORY_CARE_PROVIDER_SITE_OTHER): Payer: Medicaid Other

## 2016-07-17 VITALS — BP 90/52 | HR 104 | Wt 142.0 lb

## 2016-07-17 DIAGNOSIS — Z331 Pregnant state, incidental: Secondary | ICD-10-CM

## 2016-07-17 DIAGNOSIS — Z3A36 36 weeks gestation of pregnancy: Secondary | ICD-10-CM

## 2016-07-17 DIAGNOSIS — O26843 Uterine size-date discrepancy, third trimester: Secondary | ICD-10-CM

## 2016-07-17 DIAGNOSIS — Z3483 Encounter for supervision of other normal pregnancy, third trimester: Secondary | ICD-10-CM

## 2016-07-17 DIAGNOSIS — O99323 Drug use complicating pregnancy, third trimester: Secondary | ICD-10-CM

## 2016-07-17 DIAGNOSIS — R569 Unspecified convulsions: Secondary | ICD-10-CM

## 2016-07-17 DIAGNOSIS — Z1389 Encounter for screening for other disorder: Secondary | ICD-10-CM

## 2016-07-17 DIAGNOSIS — N390 Urinary tract infection, site not specified: Secondary | ICD-10-CM

## 2016-07-17 LAB — POCT URINALYSIS DIPSTICK
Blood, UA: NEGATIVE
Glucose, UA: NEGATIVE
Ketones, UA: NEGATIVE
Leukocytes, UA: NEGATIVE
Nitrite, UA: NEGATIVE
Protein, UA: NEGATIVE

## 2016-07-17 NOTE — Progress Notes (Signed)
Low-risk OB appointment [id number] 5249w5d Estimated Date of Delivery: 08/09/16 BP (!) 90/52   Pulse (!) 104   Wt 142 lb (64.4 kg)   LMP 11/03/2015 (Exact Date)   BMI 23.63 kg/m   BP, weight, and urine reviewed.  Refer to obstetrical flow sheet for FH & FHR.  Reports good fm.  Denies regular uc's, lof, vb, or uti s/s. No complaints. Never went to Dr. Sudie BaileyKnowlton or neurologist- just forgot. Still taking Keppra.  GBS, gc/ct collected SVE per request: ft/th/-2, vtx Reviewed labor s/s, fkc. Plan:  Continue routine obstetrical care  F/U asap for efw/afi u/s for s<d (no visit), then 1wk for OB appointment

## 2016-07-17 NOTE — Patient Instructions (Signed)
Call the office ([phone]) or go to [facility] if:  You begin to have strong, frequent contractions  Your water breaks.  Sometimes it is a big gush of fluid, sometimes it is just a trickle that keeps getting your panties wet or running down your legs  You have vaginal bleeding.  It is normal to have a small amount of spotting if your cervix was checked.   You don't feel your baby moving like normal.  If you don't, get you something to eat and drink and lay down and focus on feeling your baby move.  You should feel at least 10 movements in 2 hours.  If you don't, you should call the office or go to [facility].     Braxton Hicks Contractions Contractions of the uterus can occur throughout pregnancy, but they are not always a sign that you are in labor. You may have practice contractions called Braxton Hicks contractions. These false labor contractions are sometimes confused with true labor. What are Braxton Hicks contractions? Braxton Hicks contractions are tightening movements that occur in the muscles of the uterus before labor. Unlike true labor contractions, these contractions do not result in opening (dilation) and thinning of the cervix. Toward the end of pregnancy (32-34 weeks), Braxton Hicks contractions can happen more often and may become stronger. These contractions are sometimes difficult to tell apart from true labor because they can be very uncomfortable. You should not feel embarrassed if you go to the hospital with false labor. Sometimes, the only way to tell if you are in true labor is for your health care provider to look for changes in the cervix. The health care provider will do a physical exam and may monitor your contractions. If you are not in true labor, the exam should show that your cervix is not dilating and your water has not broken. If there are no prenatal problems or other health problems associated with your pregnancy, it is completely safe for you to be sent  home with false labor. You may continue to have Braxton Hicks contractions until you go into true labor. How can I tell the difference between true labor and false labor?  Differences ? False labor ? Contractions last 30-70 seconds.: Contractions are usually shorter and not as strong as true labor contractions. ? Contractions become very regular.: Contractions are usually irregular. ? Discomfort is usually felt in the top of the uterus, and it spreads to the lower abdomen and low back.: Contractions are often felt in the front of the lower abdomen and in the groin. ? Contractions do not go away with walking.: Contractions may go away when you walk around or change positions while lying down. ? Contractions usually become more intense and increase in frequency.: Contractions get weaker and are shorter-lasting as time goes on. ? The cervix dilates and gets thinner.: The cervix usually does not dilate or become thin. Follow these instructions at home:  Take over-the-counter and prescription medicines only as told by your health care provider.  Keep up with your usual exercises and follow other instructions from your health care provider.  Eat and drink lightly if you think you are going into labor.  If Braxton Hicks contractions are making you uncomfortable: ? Change your position from lying down or resting to walking, or change from walking to resting. ? Sit and rest in a tub of warm water. ? Drink enough fluid to keep your urine clear or pale yellow. Dehydration may cause these contractions. ?   Do slow and deep breathing several times an hour.  Keep all follow-up prenatal visits as told by your health care provider. This is important. Contact a health care provider if:  You have a fever.  You have continuous pain in your abdomen. Get help right away if:  Your contractions become stronger, more regular, and closer together.  You have fluid leaking or gushing from your vagina.  You  pass blood-tinged mucus (bloody show).  You have bleeding from your vagina.  You have low back pain that you never had before.  You feel your baby's head pushing down and causing pelvic pressure.  Your baby is not moving inside you as much as it used to. Summary  Contractions that occur before labor are called Braxton Hicks contractions, false labor, or practice contractions.  Braxton Hicks contractions are usually shorter, weaker, farther apart, and less regular than true labor contractions. True labor contractions usually become progressively stronger and regular and they become more frequent.  Manage discomfort from Braxton Hicks contractions by changing position, resting in a warm bath, drinking plenty of water, or practicing deep breathing. This information is not intended to replace advice given to you by your health care provider. Make sure you discuss any questions you have with your health care provider. Document Released: 02/18/2005 Document Revised: 01/08/2016 Document Reviewed: 01/08/2016 Elsevier Interactive Patient Education  2017 [employer] Inc[employer]  

## 2016-07-17 NOTE — Progress Notes (Signed)
US 36+5 wks,cephalic,fhr 155 bpm,post pl gr 1,normal ovaries bilat,afi 10 cm,efw 2922 g 44%

## 2016-07-18 LAB — PMP SCREEN PROFILE (10S), URINE
Amphetamine Scrn, Ur: NEGATIVE ng/mL
BARBITURATE SCREEN URINE: NEGATIVE ng/mL
BENZODIAZEPINE SCREEN, URINE: NEGATIVE ng/mL
CANNABINOIDS UR QL SCN: NEGATIVE ng/mL
Cocaine (Metab) Scrn, Ur: NEGATIVE ng/mL
Creatinine(Crt), U: 128.8 mg/dL (ref 20.0–300.0)
Methadone Screen, Urine: NEGATIVE ng/mL
OXYCODONE+OXYMORPHONE UR QL SCN: NEGATIVE ng/mL
Opiate Scrn, Ur: NEGATIVE ng/mL
Ph of Urine: 5.5 (ref 4.5–8.9)
Phencyclidine Qn, Ur: NEGATIVE ng/mL
Propoxyphene Scrn, Ur: NEGATIVE ng/mL

## 2016-07-19 LAB — URINE CULTURE

## 2016-07-19 LAB — GC/CHLAMYDIA PROBE AMP
Chlamydia trachomatis, NAA: NEGATIVE
Neisseria gonorrhoeae by PCR: NEGATIVE

## 2016-07-21 LAB — CULTURE, BETA STREP (GROUP B ONLY): Strep Gp B Culture: NEGATIVE

## 2016-07-24 ENCOUNTER — Telehealth: Payer: Self-pay | Admitting: *Deleted

## 2016-07-24 NOTE — Telephone Encounter (Signed)
Requesting refill on Ambien. Please advise

## 2016-07-25 ENCOUNTER — Other Ambulatory Visit: Payer: Self-pay | Admitting: Obstetrics & Gynecology

## 2016-07-25 ENCOUNTER — Encounter: Payer: Self-pay | Admitting: Obstetrics and Gynecology

## 2016-07-25 ENCOUNTER — Other Ambulatory Visit: Payer: Self-pay | Admitting: Obstetrics and Gynecology

## 2016-07-25 ENCOUNTER — Ambulatory Visit (INDEPENDENT_AMBULATORY_CARE_PROVIDER_SITE_OTHER): Payer: Medicaid Other | Admitting: Obstetrics and Gynecology

## 2016-07-25 VITALS — BP 110/70 | HR 88 | Wt 148.6 lb

## 2016-07-25 DIAGNOSIS — O34219 Maternal care for unspecified type scar from previous cesarean delivery: Secondary | ICD-10-CM

## 2016-07-25 DIAGNOSIS — Z3483 Encounter for supervision of other normal pregnancy, third trimester: Secondary | ICD-10-CM

## 2016-07-25 DIAGNOSIS — Z1389 Encounter for screening for other disorder: Secondary | ICD-10-CM

## 2016-07-25 DIAGNOSIS — Z331 Pregnant state, incidental: Secondary | ICD-10-CM

## 2016-07-25 LAB — POCT URINALYSIS DIPSTICK
Blood, UA: NEGATIVE
Glucose, UA: NEGATIVE
Ketones, UA: NEGATIVE
Leukocytes, UA: NEGATIVE
Nitrite, UA: NEGATIVE
Protein, UA: NEGATIVE

## 2016-07-25 MED ORDER — ZOLPIDEM TARTRATE 10 MG PO TABS: 10.0000 mg | ORAL_TABLET | Freq: Every evening | ORAL | 0 refills | Status: DC | PRN

## 2016-07-25 NOTE — Progress Notes (Signed)
[id number]  [facility]  Chief Complaint  Patient presents with  . Routine Prenatal Visit  ____  Patient complaints:none baby pressing down. Pt confirms desire for BTL and has signed papers 3/7. Patient reports   good fetal movement,                           denies any bleeding , rupture of membranes,or regular contractions.  Blood pressure 110/70, pulse 88, weight 67.4 kg (148 lb 9.6 oz), last menstrual period 11/03/2015.   Urine results:notable for negative refer to the ob flow sheet for FH and FHR, ,                          Physical Examination: General appearance - alert, well appearing, and in no distress and oriented to person, place, and time                                      Abdomen - FH 35 ,                                                         -FHR 145                                                         soft, nontender, nondistended, no masses or organomegaly welll healed surgical scars.                                      Pelvic - defer                                            Questions were answered. Assessment: LROB [id number] @ 5046w6d                         Prior cesarean x 3 for repeat c/s and BTL on 08/02/16 at 10 am  Plan:  Continued routine obstetrical care, lrob  F/u 5 days  for final prenatal

## 2016-07-26 ENCOUNTER — Telehealth (HOSPITAL_COMMUNITY): Payer: Self-pay | Admitting: *Deleted

## 2016-07-26 NOTE — Telephone Encounter (Signed)
Preadmission screen  

## 2016-07-30 ENCOUNTER — Encounter (HOSPITAL_COMMUNITY): Payer: Self-pay

## 2016-07-30 ENCOUNTER — Encounter: Payer: Self-pay | Admitting: Obstetrics & Gynecology

## 2016-07-30 ENCOUNTER — Ambulatory Visit (INDEPENDENT_AMBULATORY_CARE_PROVIDER_SITE_OTHER): Payer: Medicaid Other | Admitting: Obstetrics & Gynecology

## 2016-07-30 VITALS — BP 102/60 | HR 80 | Wt 152.0 lb

## 2016-07-30 DIAGNOSIS — Z1389 Encounter for screening for other disorder: Secondary | ICD-10-CM

## 2016-07-30 DIAGNOSIS — Z331 Pregnant state, incidental: Secondary | ICD-10-CM

## 2016-07-30 DIAGNOSIS — O34219 Maternal care for unspecified type scar from previous cesarean delivery: Secondary | ICD-10-CM

## 2016-07-30 DIAGNOSIS — Z3483 Encounter for supervision of other normal pregnancy, third trimester: Secondary | ICD-10-CM

## 2016-07-30 LAB — POCT URINALYSIS DIPSTICK
Blood, UA: NEGATIVE
Glucose, UA: NEGATIVE
Ketones, UA: NEGATIVE
Nitrite, UA: NEGATIVE
Protein, UA: NEGATIVE

## 2016-07-30 NOTE — Progress Notes (Signed)
[id number] 3953w4d Estimated Date of Delivery: 08/09/16  Blood pressure 102/60, pulse 80, weight 152 lb (68.9 kg), last menstrual period 11/03/2015.   BP weight and urine results all reviewed and noted.  Please refer to the obstetrical flow sheet for the fundal height and fetal heart rate documentation:  Patient reports good fetal movement, denies any bleeding and no rupture of membranes symptoms or regular contractions. Patient is without complaints. All questions were answered.  Orders Placed This Encounter  Procedures  . POCT urinalysis dipstick    Plan:  Continued routine obstetrical care,  C section for Friday am with Dr Emelda FearFerguson  Return in about 10 days (around 08/09/2016) for Post Op.

## 2016-08-01 ENCOUNTER — Encounter (HOSPITAL_COMMUNITY)
Admission: RE | Admit: 2016-08-01 | Discharge: 2016-08-01 | Disposition: A | Discharge status: Home / Self Care | Payer: Medicaid Other | Source: Ambulatory Visit | Attending: Cardiology | Admitting: Cardiology

## 2016-08-01 LAB — URINALYSIS, ROUTINE W REFLEX MICROSCOPIC
Bacteria, UA: NONE SEEN
Bilirubin Urine: NEGATIVE
Glucose, UA: NEGATIVE mg/dL
Ketones, ur: NEGATIVE mg/dL
Leukocytes, UA: NEGATIVE
Nitrite: NEGATIVE
Protein, ur: NEGATIVE mg/dL
Specific Gravity, Urine: 1.013 (ref 1.005–1.030)
pH: 5 (ref 5.0–8.0)

## 2016-08-01 LAB — TYPE AND SCREEN
ABO/RH(D): O POS
Antibody Screen: NEGATIVE

## 2016-08-01 LAB — CBC
HCT: 35.8 % — ABNORMAL LOW (ref 36.0–46.0)
Hemoglobin: 12 g/dL (ref 12.0–15.0)
MCH: 31.1 pg (ref 26.0–34.0)
MCHC: 33.5 g/dL (ref 30.0–36.0)
MCV: 92.7 fL (ref 78.0–100.0)
Platelets: 221 10*3/uL (ref 150–400)
RBC: 3.86 MIL/uL — ABNORMAL LOW (ref 3.87–5.11)
RDW: 13.7 % (ref 11.5–15.5)
WBC: 7.5 10*3/uL (ref 4.0–10.5)

## 2016-08-01 NOTE — Patient Instructions (Signed)
20 Tonya Harmon  08/01/2016   Your procedure is scheduled on:  08/02/2016  [facility].  Pick up the phone at the desk and dial [phone].   Call this number if you have problems the morning of surgery: [phone]   Remember:   Do not eat food:After Midnight.  Do not drink clear liquids: After Midnight.  Take these medicines the morning of surgery with A SIP OF WATER: na   Do not wear jewelry, make-up or nail polish.  Do not wear lotions, powders, or perfumes. Do not wear deodorant.  Do not shave 48 hours prior to surgery.  Do not bring valuables to the hospital.  [facility] is not   responsible for any belongings or valuables brought to the hospital.  Contacts, dentures or bridgework may not be worn into surgery.  Leave suitcase in the car. After surgery it may be brought to your room.  For patients admitted to the hospital, checkout time is 11:00 AM the day of              discharge.   Patients discharged the day of surgery will not be allowed to drive             home.  Name and phone number of your driver: na  Special Instructions:   N/A   Please read over the following fact sheets that you were given:   Surgical Site Infection Prevention

## 2016-08-02 ENCOUNTER — Inpatient Hospital Stay (HOSPITAL_COMMUNITY): Payer: Medicaid Other | Admitting: Anesthesiology

## 2016-08-02 ENCOUNTER — Encounter (HOSPITAL_COMMUNITY)
Admission: RE | Disposition: A | Discharge status: Home / Self Care | Payer: Self-pay | Source: Ambulatory Visit | Attending: Obstetrics and Gynecology

## 2016-08-02 ENCOUNTER — Encounter (HOSPITAL_COMMUNITY): Payer: Self-pay | Admitting: *Deleted

## 2016-08-02 ENCOUNTER — Inpatient Hospital Stay (HOSPITAL_COMMUNITY)
Admission: RE | Admit: 2016-08-02 | Discharge: 2016-08-04 | DRG: 765 | Disposition: A | Discharge status: Home / Self Care | Payer: Medicaid Other | Source: Ambulatory Visit | Attending: Obstetrics and Gynecology | Admitting: Obstetrics and Gynecology

## 2016-08-02 DIAGNOSIS — G40909 Epilepsy, unspecified, not intractable, without status epilepticus: Secondary | ICD-10-CM | POA: Diagnosis present

## 2016-08-02 DIAGNOSIS — Z302 Encounter for sterilization: Secondary | ICD-10-CM | POA: Diagnosis not present

## 2016-08-02 DIAGNOSIS — O99354 Diseases of the nervous system complicating childbirth: Secondary | ICD-10-CM | POA: Diagnosis present

## 2016-08-02 DIAGNOSIS — Z98891 History of uterine scar from previous surgery: Secondary | ICD-10-CM

## 2016-08-02 DIAGNOSIS — Z3A39 39 weeks gestation of pregnancy: Secondary | ICD-10-CM

## 2016-08-02 DIAGNOSIS — O34211 Maternal care for low transverse scar from previous cesarean delivery: Secondary | ICD-10-CM | POA: Diagnosis not present

## 2016-08-02 DIAGNOSIS — Z87891 Personal history of nicotine dependence: Secondary | ICD-10-CM | POA: Diagnosis not present

## 2016-08-02 LAB — RPR: RPR Ser Ql: NONREACTIVE

## 2016-08-02 LAB — SURGICAL PCR SCREEN
MRSA, PCR: NEGATIVE
Staphylococcus aureus: NEGATIVE

## 2016-08-02 SURGERY — Surgical Case
Anesthesia: Spinal | Site: Abdomen | Laterality: Bilateral | Wound class: Clean Contaminated

## 2016-08-02 MED ORDER — ZOLPIDEM TARTRATE 5 MG PO TABS: 5.0000 mg | ORAL_TABLET | Freq: Every evening | ORAL | Status: DC | PRN

## 2016-08-02 MED ORDER — FENTANYL CITRATE (PF) 100 MCG/2ML IJ SOLN
INTRAMUSCULAR | Status: DC | PRN
  Administered 2016-08-02: 20 ug via INTRATHECAL

## 2016-08-02 MED ORDER — PHENYLEPHRINE 8 MG IN D5W 100 ML (0.08MG/ML) PREMIX OPTIME
INJECTION | INTRAVENOUS | Status: DC | PRN
  Administered 2016-08-02: 60 ug/min via INTRAVENOUS

## 2016-08-02 MED ORDER — SIMETHICONE 80 MG PO CHEW
80.0000 mg | CHEWABLE_TABLET | Freq: Three times a day (TID) | ORAL | Status: DC
  Administered 2016-08-02 – 2016-08-04 (×4): 80 mg via ORAL
  Filled 2016-08-02 (×4): qty 1

## 2016-08-02 MED ORDER — PHENYLEPHRINE 8 MG IN D5W 100 ML (0.08MG/ML) PREMIX OPTIME
INJECTION | INTRAVENOUS | Status: AC
  Filled 2016-08-02: qty 100

## 2016-08-02 MED ORDER — ONDANSETRON HCL 4 MG/2ML IJ SOLN
INTRAMUSCULAR | Status: DC | PRN
  Administered 2016-08-02: 4 mg via INTRAVENOUS

## 2016-08-02 MED ORDER — MEPERIDINE HCL 25 MG/ML IJ SOLN: 6.2500 mg | INTRAMUSCULAR | Status: DC | PRN

## 2016-08-02 MED ORDER — BUPIVACAINE IN DEXTROSE 0.75-8.25 % IT SOLN
INTRATHECAL | Status: DC | PRN
  Administered 2016-08-02: 11.5 mg via INTRATHECAL

## 2016-08-02 MED ORDER — GENTAMICIN SULFATE 40 MG/ML IJ SOLN
INTRAVENOUS | Status: AC
  Administered 2016-08-02: 113 mL via INTRAVENOUS
  Filled 2016-08-02: qty 7.75

## 2016-08-02 MED ORDER — LACTATED RINGERS IV SOLN
INTRAVENOUS | Status: DC
  Administered 2016-08-02 (×4): via INTRAVENOUS

## 2016-08-02 MED ORDER — BUPIVACAINE IN DEXTROSE 0.75-8.25 % IT SOLN
INTRATHECAL | Status: AC
  Filled 2016-08-02: qty 2

## 2016-08-02 MED ORDER — MORPHINE SULFATE (PF) 0.5 MG/ML IJ SOLN
INTRAMUSCULAR | Status: DC | PRN
  Administered 2016-08-02: .2 mg via INTRATHECAL

## 2016-08-02 MED ORDER — SODIUM CHLORIDE 0.9% FLUSH: 3.0000 mL | INTRAVENOUS | Status: DC | PRN

## 2016-08-02 MED ORDER — SOD CITRATE-CITRIC ACID 500-334 MG/5ML PO SOLN
30.0000 mL | Freq: Once | ORAL | Status: AC
  Administered 2016-08-02: 30 mL via ORAL

## 2016-08-02 MED ORDER — SODIUM CHLORIDE 0.9 % IR SOLN
Status: DC | PRN
  Administered 2016-08-02: 1

## 2016-08-02 MED ORDER — IBUPROFEN 600 MG PO TABS
600.0000 mg | ORAL_TABLET | Freq: Four times a day (QID) | ORAL | Status: DC
  Administered 2016-08-02 – 2016-08-04 (×6): 600 mg via ORAL
  Filled 2016-08-02 (×6): qty 1

## 2016-08-02 MED ORDER — DIPHENHYDRAMINE HCL 25 MG PO CAPS: 25.0000 mg | ORAL_CAPSULE | Freq: Four times a day (QID) | ORAL | Status: DC | PRN

## 2016-08-02 MED ORDER — DIPHENHYDRAMINE HCL 25 MG PO CAPS: 25.0000 mg | ORAL_CAPSULE | ORAL | Status: DC | PRN

## 2016-08-02 MED ORDER — COCONUT OIL OIL
1.0000 "application " | TOPICAL_OIL | Status: DC | PRN
  Administered 2016-08-04: 1 via TOPICAL
  Filled 2016-08-02: qty 120

## 2016-08-02 MED ORDER — SIMETHICONE 80 MG PO CHEW: 80.0000 mg | CHEWABLE_TABLET | ORAL | Status: DC | PRN

## 2016-08-02 MED ORDER — SCOPOLAMINE 1 MG/3DAYS TD PT72
1.0000 | MEDICATED_PATCH | Freq: Once | TRANSDERMAL | Status: DC
  Administered 2016-08-02: 1.5 mg via TRANSDERMAL
  Filled 2016-08-02: qty 1

## 2016-08-02 MED ORDER — MENTHOL 3 MG MT LOZG: 1.0000 | LOZENGE | OROMUCOSAL | Status: DC | PRN

## 2016-08-02 MED ORDER — SIMETHICONE 80 MG PO CHEW
80.0000 mg | CHEWABLE_TABLET | ORAL | Status: DC
  Administered 2016-08-02 – 2016-08-03 (×2): 80 mg via ORAL
  Filled 2016-08-02 (×2): qty 1

## 2016-08-02 MED ORDER — ONDANSETRON HCL 4 MG/2ML IJ SOLN: 4.0000 mg | Freq: Three times a day (TID) | INTRAMUSCULAR | Status: DC | PRN

## 2016-08-02 MED ORDER — NALOXONE HCL 2 MG/2ML IJ SOSY
1.0000 ug/kg/h | PREFILLED_SYRINGE | INTRAVENOUS | Status: DC | PRN
  Filled 2016-08-02: qty 2

## 2016-08-02 MED ORDER — ACETAMINOPHEN 500 MG PO TABS
1000.0000 mg | ORAL_TABLET | Freq: Four times a day (QID) | ORAL | Status: AC
  Administered 2016-08-02 – 2016-08-03 (×3): 1000 mg via ORAL
  Filled 2016-08-02 (×4): qty 2

## 2016-08-02 MED ORDER — TETANUS-DIPHTH-ACELL PERTUSSIS 5-2.5-18.5 LF-MCG/0.5 IM SUSP
0.5000 mL | Freq: Once | INTRAMUSCULAR | Status: AC
  Administered 2016-08-03: 0.5 mL via INTRAMUSCULAR

## 2016-08-02 MED ORDER — OXYTOCIN 10 UNIT/ML IJ SOLN
INTRAMUSCULAR | Status: AC
  Filled 2016-08-02: qty 4

## 2016-08-02 MED ORDER — DIBUCAINE 1 % RE OINT: 1.0000 "application " | TOPICAL_OINTMENT | RECTAL | Status: DC | PRN

## 2016-08-02 MED ORDER — SENNOSIDES-DOCUSATE SODIUM 8.6-50 MG PO TABS
2.0000 | ORAL_TABLET | ORAL | Status: DC
  Administered 2016-08-02 – 2016-08-03 (×2): 2 via ORAL
  Filled 2016-08-02 (×2): qty 2

## 2016-08-02 MED ORDER — WITCH HAZEL-GLYCERIN EX PADS: 1.0000 "application " | MEDICATED_PAD | CUTANEOUS | Status: DC | PRN

## 2016-08-02 MED ORDER — FENTANYL CITRATE (PF) 100 MCG/2ML IJ SOLN
25.0000 ug | INTRAMUSCULAR | Status: DC | PRN
  Administered 2016-08-02: 25 ug via INTRAVENOUS
  Administered 2016-08-02: 50 ug via INTRAVENOUS
  Administered 2016-08-02: 25 ug via INTRAVENOUS

## 2016-08-02 MED ORDER — ACETAMINOPHEN 325 MG PO TABS
650.0000 mg | ORAL_TABLET | ORAL | Status: DC | PRN
  Administered 2016-08-04: 650 mg via ORAL
  Filled 2016-08-02: qty 2

## 2016-08-02 MED ORDER — OXYTOCIN 40 UNITS IN LACTATED RINGERS INFUSION - SIMPLE MED: 2.5000 [IU]/h | INTRAVENOUS | Status: AC

## 2016-08-02 MED ORDER — LACTATED RINGERS IV SOLN
INTRAVENOUS | Status: DC
  Administered 2016-08-02 (×2): via INTRAVENOUS

## 2016-08-02 MED ORDER — LEVETIRACETAM 500 MG PO TABS
500.0000 mg | ORAL_TABLET | Freq: Two times a day (BID) | ORAL | Status: DC
  Filled 2016-08-02 (×3): qty 1

## 2016-08-02 MED ORDER — FENTANYL CITRATE (PF) 100 MCG/2ML IJ SOLN
INTRAMUSCULAR | Status: AC
  Filled 2016-08-02: qty 2

## 2016-08-02 MED ORDER — SOD CITRATE-CITRIC ACID 500-334 MG/5ML PO SOLN
ORAL | Status: AC
  Administered 2016-08-02: 30 mL via ORAL
  Filled 2016-08-02: qty 15

## 2016-08-02 MED ORDER — PRENATAL MULTIVITAMIN CH
1.0000 | ORAL_TABLET | Freq: Every day | ORAL | Status: DC
  Administered 2016-08-03 – 2016-08-04 (×2): 1 via ORAL
  Filled 2016-08-02: qty 1

## 2016-08-02 MED ORDER — FENTANYL CITRATE (PF) 100 MCG/2ML IJ SOLN
INTRAMUSCULAR | Status: AC
  Administered 2016-08-02: 25 ug via INTRAVENOUS
  Filled 2016-08-02: qty 2

## 2016-08-02 MED ORDER — MORPHINE SULFATE (PF) 0.5 MG/ML IJ SOLN
INTRAMUSCULAR | Status: AC
  Filled 2016-08-02: qty 10

## 2016-08-02 MED ORDER — LACTATED RINGERS IV SOLN
INTRAVENOUS | Status: DC | PRN
  Administered 2016-08-02: 40 [IU] via INTRAVENOUS

## 2016-08-02 MED ORDER — METOCLOPRAMIDE HCL 5 MG/ML IJ SOLN: 10.0000 mg | Freq: Once | INTRAMUSCULAR | Status: DC | PRN

## 2016-08-02 MED ORDER — NALOXONE HCL 0.4 MG/ML IJ SOLN: 0.4000 mg | INTRAMUSCULAR | Status: DC | PRN

## 2016-08-02 MED ORDER — ONDANSETRON HCL 4 MG/2ML IJ SOLN
INTRAMUSCULAR | Status: AC
  Filled 2016-08-02: qty 2

## 2016-08-02 MED ORDER — DIPHENHYDRAMINE HCL 50 MG/ML IJ SOLN: 12.5000 mg | INTRAMUSCULAR | Status: DC | PRN

## 2016-08-02 MED ORDER — LEVETIRACETAM 500 MG PO TABS
500.0000 mg | ORAL_TABLET | ORAL | Status: DC
  Administered 2016-08-02 – 2016-08-03 (×4): 500 mg via ORAL
  Filled 2016-08-02 (×6): qty 1

## 2016-08-02 SURGICAL SUPPLY — 39 items
APL SKNCLS STERI-STRIP NONHPOA (GAUZE/BANDAGES/DRESSINGS) ×1
BENZOIN TINCTURE PRP APPL 2/3 (GAUZE/BANDAGES/DRESSINGS) ×1 IMPLANT
CLAMP CORD UMBIL (MISCELLANEOUS) IMPLANT
CLIP FILSHIE TUBAL LIGA STRL (Clip) ×1 IMPLANT
CLOSURE STERI STRIP 1/2 X4 (GAUZE/BANDAGES/DRESSINGS) ×1 IMPLANT
CLOTH BEACON ORANGE TIMEOUT ST (SAFETY) ×2 IMPLANT
DRSG OPSITE POSTOP 4X10 (GAUZE/BANDAGES/DRESSINGS) ×2 IMPLANT
DURAPREP 26ML APPLICATOR (WOUND CARE) ×2 IMPLANT
ELECT REM PT RETURN 9FT ADLT (ELECTROSURGICAL) ×2
ELECTRODE REM PT RTRN 9FT ADLT (ELECTROSURGICAL) ×1 IMPLANT
EXTRACTOR VACUUM KIWI (MISCELLANEOUS) IMPLANT
GAUZE SPONGE 4X4 12PLY STRL LF (GAUZE/BANDAGES/DRESSINGS) ×2 IMPLANT
GLOVE BIO SURGEON ST LM GN SZ9 (GLOVE) ×2 IMPLANT
GLOVE BIOGEL PI IND STRL 7.0 (GLOVE) ×1 IMPLANT
GLOVE BIOGEL PI IND STRL 9 (GLOVE) ×1 IMPLANT
GLOVE BIOGEL PI INDICATOR 7.0 (GLOVE) ×3
GLOVE BIOGEL PI INDICATOR 9 (GLOVE) ×1
GOWN STRL REUS W/TWL 2XL LVL3 (GOWN DISPOSABLE) ×2 IMPLANT
GOWN STRL REUS W/TWL LRG LVL3 (GOWN DISPOSABLE) ×4 IMPLANT
NDL HYPO 25X5/8 SAFETYGLIDE (NEEDLE) IMPLANT
NEEDLE HYPO 25X5/8 SAFETYGLIDE (NEEDLE) IMPLANT
NS IRRIG 1000ML POUR BTL (IV SOLUTION) ×2 IMPLANT
PACK C SECTION WH (CUSTOM PROCEDURE TRAY) ×2 IMPLANT
PAD ABD 7.5X8 STRL (GAUZE/BANDAGES/DRESSINGS) ×1 IMPLANT
PAD OB MATERNITY 4.3X12.25 (PERSONAL CARE ITEMS) ×2 IMPLANT
PENCIL SMOKE EVAC W/HOLSTER (ELECTROSURGICAL) ×2 IMPLANT
RTRCTR C-SECT PINK 25CM LRG (MISCELLANEOUS) IMPLANT
RTRCTR C-SECT PINK 34CM XLRG (MISCELLANEOUS) IMPLANT
STRIP CLOSURE SKIN 1/2X4 (GAUZE/BANDAGES/DRESSINGS) IMPLANT
SUT MNCRL 0 VIOLET CTX 36 (SUTURE) ×2 IMPLANT
SUT MONOCRYL 0 CTX 36 (SUTURE) ×2
SUT VIC AB 0 CT1 27 (SUTURE) ×2
SUT VIC AB 0 CT1 27XBRD ANBCTR (SUTURE) ×1 IMPLANT
SUT VIC AB 2-0 CT1 27 (SUTURE) ×4
SUT VIC AB 2-0 CT1 TAPERPNT 27 (SUTURE) ×2 IMPLANT
SUT VIC AB 4-0 KS 27 (SUTURE) ×2 IMPLANT
SYR BULB IRRIGATION 50ML (SYRINGE) IMPLANT
TOWEL OR 17X24 6PK STRL BLUE (TOWEL DISPOSABLE) ×2 IMPLANT
TRAY FOLEY BAG SILVER LF 14FR (SET/KITS/TRAYS/PACK) ×2 IMPLANT

## 2016-08-02 NOTE — H&P (Signed)
Tonya Harmon is a 26 y.o. female presenting for repeat cesarean section and tubal ligation by filshie clips.. Patient is a [id number] at 1094w0d who is scheduled this morning, has confirmed her desire for permanent sterilization, and [facility]. OB History    Gravida Para Term Preterm AB Living   4 3 3     3    SAB TAB Ectopic Multiple Live Births           3     Past Medical History:  Diagnosis Date  . Anxiety   . Back pain   . Bipolar 1 disorder (HCC)   . Bipolar disorder (HCC)   . Common migraine with intractable migraine 06/08/2014  . Contraceptive management 10/05/2015  . Depression   . Epilepsy (HCC)   . GERD (gastroesophageal reflux disease)    pregnancy related-rolaids prn  . Headache(784.0)   . Insomnia   . Nexplanon in place 10/04/2015  . Pelvic pain in female 10/04/2015  . PONV (postoperative nausea and vomiting)   . Sciatica   . Seizures (HCC)    Past Surgical History:  Procedure Laterality Date  . CESAREAN SECTION  2011,2008  . CESAREAN SECTION N/A 05/18/2012   Procedure: CESAREAN SECTION;  Surgeon: Tilda BurrowJohn V Jatoya Armbrister, MD;  Location: [facility];  Service: Obstetrics;  Laterality: N/A;  . INCISION AND DRAINAGE ABSCESS Left 09/10/2013   Procedure: INCISION AND DRAINAGE ABSCESS, left breast/axilla;  Surgeon: Dalia HeadingMark A Jenkins, MD;  Location: [facility];  Service: General;  Laterality: Left;   Family History: family history includes Alzheimer's disease in her maternal grandmother; Cancer in her maternal grandmother, other, paternal grandfather, and paternal grandmother; Coronary artery disease in her maternal grandfather and other; Diabetes in her maternal grandfather, mother, other, and paternal grandmother; Fibromyalgia in her mother; Hypertension in her maternal grandfather, other, paternal grandfather, and paternal grandmother; Rheum arthritis in her mother; Stroke in her maternal grandmother; Tremor in her maternal  grandmother. Social History:  reports that she has quit smoking. Her smoking use included Cigarettes. She has never used smokeless tobacco. She reports that she does not drink alcohol or use drugs.     Maternal Diabetes: No Genetic Screening: Normal Maternal Ultrasounds/Referrals: Normal Fetal Ultrasounds or other Referrals:  None Maternal Substance Abuse:  No smokes. Had initial uds + opiates but none since. Significant Maternal Medications:  None Significant Maternal Lab Results:  Lab values include: Group B Strep negative Other Comments:  None  ROS  negative History well healed abdomen noted at last cesarean, no large scars.   Blood pressure 126/82, pulse 75, temperature 99.2 F (37.3 C), temperature source Oral, resp. rate 16, height 5\' 5"  (1.651 m), weight 153 lb (69.4 kg), last menstrual period 11/03/2015. Exam Physical Exam  Constitutional: She appears well-developed and well-nourished.  HENT:  Head: Normocephalic and atraumatic.  Eyes: Pupils are equal, round, and reactive to light.  Neck: Normal range of motion.  Cardiovascular: Normal rate and regular rhythm.   Respiratory: Effort normal.  GI: Soft.  Gravid uterus, c/w dates, well healed transverse abd scar.    Prenatal labs: ABO, Rh: --/--/O POS (05/31 [id number]) Antibody: NEG (05/31 0946) Rubella: 3.39 (11/29 1546) RPR: Non Reactive (05/31 1000)  HBsAg: Negative (11/29 1546)  HIV: Non Reactive (03/07 0903)  GBS:   neg  Assessment/Plan: Pregnancy 39 wk, repeat cesarean section, desire for elective sterilization   Giovanni Biby V 08/02/2016, 10:09 AM

## 2016-08-02 NOTE — Anesthesia Procedure Notes (Signed)
Spinal  Patient location during procedure: OR Start time: 08/02/2016 10:34 AM Staffing Anesthesiologist: Mal AmabileFOSTER, [place] Preanesthetic Checklist Completed: patient identified, site marked, surgical consent, pre-op evaluation, timeout performed, IV checked, risks and benefits discussed and monitors and equipment checked Spinal Block Patient position: sitting Prep: site prepped and draped and DuraPrep Patient monitoring: heart rate, cardiac monitor, continuous pulse ox and blood pressure Approach: midline Location: L3-4 Injection technique: single-shot Needle Needle type: Pencan  Needle gauge: 24 G Needle length: 9 cm Needle insertion depth: 45 cm Assessment Sensory level: T4 Additional Notes Patient tolerated procedure well. Adequate sensory level.

## 2016-08-02 NOTE — Transfer of Care (Signed)
Immediate Anesthesia Transfer of Care Note  Patient: Tonya Harmon  Procedure(s) Performed: Procedure(s): CESAREAN SECTION WITH BILATERAL TUBAL LIGATION (Bilateral)  Patient Location: PACU  Anesthesia Type:Spinal  Level of Consciousness: awake and alert   Airway & Oxygen Therapy: Patient Spontanous Breathing  Post-op Assessment: Report given to RN and Post -op Vital signs reviewed and stable  Post vital signs: Reviewed  Last Vitals:  Vitals:   08/02/16 0929  BP: 126/82  Pulse: 75  Resp: 16  Temp: 37.3 C    Last Pain:  Vitals:   08/02/16 0929  TempSrc: Oral         Complications: No apparent anesthesia complications

## 2016-08-02 NOTE — Op Note (Signed)
08/02/2016  11:35 AM  PATIENT:  Tonya Harmon  26 y.o. female  PRE-OPERATIVE DIAGNOSIS:  repeat c-section desires sterilization  POST-OPERATIVE DIAGNOSIS:  repeat c/s and desires sterilization  PROCEDURE:  Procedure(s): CESAREAN SECTION WITH BILATERAL TUBAL LIGATION (Bilateral)  SURGEON:  Surgeon(s) and Role:    Tilda Burrow* Jeshua Ransford V, MD - Primary  PHYSICIAN ASSISTANT:   ASSISTANTS: none   ANESTHESIA:   none  EBL:  Total I/O In: 2300 [I.V.:2300] Out: 400 [Urine:100; Blood:300]  BLOOD ADMINISTERED:none  DRAINS: Urinary Catheter (Foley)   LOCAL MEDICATIONS USED:  MARCAINE     SPECIMEN:  No Specimen  DISPOSITION OF SPECIMEN:  PATHOLOGY  COUNTS:  YES  TOURNIQUET:  * No tourniquets in log *  DICTATION: .Dragon Dictation  PLAN OF CARE: Admit to inpatient   PATIENT DISPOSITION:  PACU - hemodynamically stable.   Delay start of Pharmacological VTE agent (>24hrs) due to surgical blood loss or risk of bleeding: not applicable Her: Patient was taken operating room prepped and draped for lower abdominal surgery, with timeout conducted. Antibiotic prophylaxis was administered. Transverse lower abdominal incision was made along the previous well-healed surgical scar and minimal bleeding encountered on the way in. The fascia was sharply dissected off the muscles excellent hemostasis. Peritoneal cavity was entered in the midline and essentially no adhesions were noted abdominal cavity. Transverse uterine incision was made through thinned out area after a small bladder flap development. Fetal vertex was rotated into the incision and delivered by fundal pressure. The cord was clamped after 1 minute. The baby was passed the pediatricians. Bulb suctioning of the nose and pharynx had been performed. Pediatricians noted that the baby was slightly t tachypnea The baby ended up going to the NICU for observation on the levels of oxygen support  The placenta delivered easily and  uterus was swabbed out. No membrane remnants were identified. Closed in single layer running locking 0 Monocryl closure with good hemostasis. Tubal ligation: Tubal ligation was performed by placing Filshie clip at each fallopian tube approximately 3 cm from the cornu with careful attention to ensure that the clip included the fallopian tube. Ovaries were visually normal. Abdomen was irrigated briefly the anterior peritoneum closed running 2-0 Vicryl fascia closed running 0 Vicryl the subcutaneous tissues reapproximated with interrupted 2-0 Vicryl 3 sutures, mattress suture and then subcuticular 4-0 Vicryl close the skin. Steri-Strips were applied patient to recovery room in stable condition.

## 2016-08-02 NOTE — Brief Op Note (Signed)
08/02/2016  11:35 AM  PATIENT:  Tonya Harmon  26 y.o. female  PRE-OPERATIVE DIAGNOSIS:  repeat c-section desires sterilization  POST-OPERATIVE DIAGNOSIS:  repeat c/s and desires sterilization  PROCEDURE:  Procedure(s): CESAREAN SECTION WITH BILATERAL TUBAL LIGATION (Bilateral)  SURGEON:  Surgeon(s) and Role:    Tilda Burrow* Sheryll Dymek V, MD - Primary  PHYSICIAN ASSISTANT:   ASSISTANTS: none   ANESTHESIA:   none  EBL:  Total I/O In: 2300 [I.V.:2300] Out: 400 [Urine:100; Blood:300]  BLOOD ADMINISTERED:none  DRAINS: Urinary Catheter (Foley)   LOCAL MEDICATIONS USED:  MARCAINE     SPECIMEN:  No Specimen  DISPOSITION OF SPECIMEN:  PATHOLOGY  COUNTS:  YES  TOURNIQUET:  * No tourniquets in log *  DICTATION: .Dragon Dictation  PLAN OF CARE: Admit to inpatient   PATIENT DISPOSITION:  PACU - hemodynamically stable.   Delay start of Pharmacological VTE agent (>24hrs) due to surgical blood loss or risk of bleeding: not applicable

## 2016-08-02 NOTE — Lactation Note (Signed)
This note was copied from a baby's chart. Lactation Consultation Note  Patient Name: Tonya Harmon NFAOZ'HToday's Date: 08/02/2016 Reason for consult: Initial assessment;NICU baby  NICU baby 3 hours old. Mom reports that she nursed first 3 children for 1 year each without any issues. Mom stated that she would like to begin pumping with DEBP. Set mom up with DEBP and demonstrated assembly and disassembly of pump parts, and how to clean. Assisted mom with use of DEBP and mom had colostrum flowing. Mom given labels and discussed including date and time that milk pumped on the label. Discussed EBM storage as well. Enc mom to pump every 2-3 hours for a total of at least 8 times/24 hours followed by hand expression.   Mom given NICU booklet (copies) with review. [facility] reports that [facility], so enc to call for hospital-grade pump. [facility]. Washed mom's pump parts for her and placed back with pump. Assisted with labeling of EBM and [facility]. Discussed assessment and interventions with patient's bedside nurse Johnny BridgeMartha, RN.   Maternal Data Has patient been taught Hand Expression?: Yes Does the patient have breastfeeding experience prior to this delivery?: Yes  Feeding    LATCH Score/Interventions                      Lactation Tools Discussed/Used Pump Review: Setup, frequency, and cleaning;Milk Storage Initiated by:: JW Date initiated:: 08/03/16   Consult Status Consult Status: Follow-up Date: 08/03/16 Follow-up type: In-patient    Sherlyn HayJennifer D Kathalene Sporer 08/02/2016, 3:42 PM

## 2016-08-02 NOTE — Anesthesia Preprocedure Evaluation (Signed)
Anesthesia Evaluation  Patient identified by MRN, date of birth, ID band Patient awake    Reviewed: Allergy & Precautions, NPO status , Patient's Chart, lab work & pertinent test results  History of Anesthesia Complications (+) history of anesthetic complications  Airway Mallampati: III  TM Distance: >3 FB Neck ROM: Full    Dental no notable dental hx. (+) Teeth Intact   Pulmonary former smoker,    Pulmonary exam normal breath sounds clear to auscultation       Cardiovascular negative cardio ROS Normal cardiovascular exam Rhythm:Regular Rate:Normal     Neuro/Psych  Headaches, Seizures -, Well Controlled,  PSYCHIATRIC DISORDERS Anxiety Depression Bipolar Disorder  Neuromuscular disease    GI/Hepatic GERD  Medicated and Controlled,(+)     substance abuse  ,   Endo/Other  negative endocrine ROS  Renal/GU negative Renal ROS  negative genitourinary   Musculoskeletal negative musculoskeletal ROS (+)   Abdominal   Peds  Hematology  (+) anemia ,   Anesthesia Other Findings   Reproductive/Obstetrics (+) Pregnancy Previous C/Section x 3 Desires sterilization                             Anesthesia Physical Anesthesia Plan  ASA: II  Anesthesia Plan: Spinal   Post-op Pain Management:    Induction:   Airway Management Planned: Natural Airway  Additional Equipment:   Intra-op Plan:   Post-operative Plan:   Informed Consent: I have reviewed the patients History and Physical, chart, labs and discussed the procedure including the risks, benefits and alternatives for the proposed anesthesia with the patient or authorized representative who has indicated his/her understanding and acceptance.   Dental advisory given  Plan Discussed with: Anesthesiologist, CRNA and Surgeon  Anesthesia Plan Comments:         Anesthesia Quick Evaluation

## 2016-08-02 NOTE — Anesthesia Postprocedure Evaluation (Signed)
Anesthesia Post Note  Patient: Tonya Harmon  Procedure(s) Performed: Procedure(s) (LRB): CESAREAN SECTION WITH BILATERAL TUBAL LIGATION (Bilateral)     Patient location during evaluation: PACU Anesthesia Type: Spinal Level of consciousness: oriented and awake and alert Pain management: pain level controlled Vital Signs Assessment: post-procedure vital signs reviewed and stable Respiratory status: spontaneous breathing, nonlabored ventilation and respiratory function stable Cardiovascular status: blood pressure returned to baseline and stable Postop Assessment: no headache, no backache, spinal receding, patient able to bend at knees and no signs of nausea or vomiting Anesthetic complications: no    Last Vitals:  Vitals:   08/02/16 1245 08/02/16 1307  BP: 111/72 111/72  Pulse: (!) 59 (!) 57  Resp: (!) 24 14  Temp:      Last Pain:  Vitals:   08/02/16 1242  TempSrc:   PainSc: 5    Pain Goal:                 Emelynn Rance A.

## 2016-08-03 ENCOUNTER — Encounter (HOSPITAL_COMMUNITY): Payer: Self-pay | Admitting: Behavioral Health

## 2016-08-03 DIAGNOSIS — O34211 Maternal care for low transverse scar from previous cesarean delivery: Secondary | ICD-10-CM

## 2016-08-03 DIAGNOSIS — Z3A39 39 weeks gestation of pregnancy: Secondary | ICD-10-CM

## 2016-08-03 LAB — CBC
HCT: 31.1 % — ABNORMAL LOW (ref 36.0–46.0)
Hemoglobin: 10.6 g/dL — ABNORMAL LOW (ref 12.0–15.0)
MCH: 31.4 pg (ref 26.0–34.0)
MCHC: 34.1 g/dL (ref 30.0–36.0)
MCV: 92 fL (ref 78.0–100.0)
Platelets: 190 10*3/uL (ref 150–400)
RBC: 3.38 MIL/uL — ABNORMAL LOW (ref 3.87–5.11)
RDW: 13.7 % (ref 11.5–15.5)
WBC: 7.3 10*3/uL (ref 4.0–10.5)

## 2016-08-03 MED ORDER — OXYCODONE HCL 5 MG PO TABS
5.0000 mg | ORAL_TABLET | Freq: Once | ORAL | Status: AC
  Administered 2016-08-03: 5 mg via ORAL
  Filled 2016-08-03: qty 1

## 2016-08-03 MED ORDER — OXYCODONE HCL 5 MG PO TABS
5.0000 mg | ORAL_TABLET | ORAL | Status: DC | PRN
  Administered 2016-08-03 – 2016-08-04 (×4): 5 mg via ORAL
  Filled 2016-08-03 (×5): qty 1

## 2016-08-03 MED ORDER — KETOROLAC TROMETHAMINE 10 MG PO TABS
10.0000 mg | ORAL_TABLET | Freq: Once | ORAL | Status: DC
  Filled 2016-08-03: qty 1

## 2016-08-03 NOTE — Lactation Note (Signed)
This note was copied from a baby's chart. Lactation Consultation Note  Patient Name: Tonya Harmon ZOXWR'UToday's Date: 08/03/2016 Reason for consult: Follow-up assessment;NICU baby;Other (Comment) (per mom the baby is suppose to be transferred to my room thsi afternoon)  Per mom I have been going to NICU to breast feed both breast and post pumping.  [facility]. Per mom milk is in , but denies and issues with engorgement.  The most she has got'en with pumping 30 ml and she has been taking it to NICU. LC suggested to mom to call to have a latch feeding assessment done by the RN or LC.    Maternal Data    Feeding Feeding Type:  (per mom breast feeding and post  pumping ) Length of feed: 30 min  LATCH Score/Interventions                      Lactation Tools Discussed/Used Tools: Pump (at bedside - see LC note ) Breast pump type: Double-Electric Breast Pump   Consult Status Consult Status: Follow-up Date: 08/04/16 Follow-up type: In-patient    Matilde SprangMargaret Ann Jatavious Peppard 08/03/2016, 3:16 PM

## 2016-08-03 NOTE — Addendum Note (Signed)
Addendum  created 08/03/16 0835 by Angela AdamWrinkle, Bianey Tesoro G, CRNA   Sign clinical note

## 2016-08-03 NOTE — Clinical Social Work Maternal (Signed)
  CLINICAL SOCIAL WORK MATERNAL/CHILD NOTE  Patient Details  Name: Tonya Harmon MRN: [id number] Date of Birth: 06/15/1990  Date:  08/03/2016  Clinical Social Worker Initiating Note:  Ferdinand Lango Mihaela Fajardo, MSW, LCSW-A  Date/ Time Initiated:  08/03/16/1110     Child's Name:  Tonya Harmon   Legal Guardian:  Other (Comment) (Not established by court system; MOB and FOB ( Charles Harmon) parent collectively)   Need for Interpreter:  None   Date of Referral:  08/03/16     Reason for Referral:  Other (Comment) (NICU admission)   Referral Source:  NICU   Address:  [place], [place]  Phone number:  [phone]   Household Members:  Self, Minor Children, Spouse   Natural Supports (not living in the home):  Parent, Friends, Extended Family   Professional Supports: None   Employment: Unemployed   Type of Work: Unemployed    Education:  Database administrator Resources:  Medicaid   Other Resources:      Cultural/Religious Considerations Which May Impact Care:  None reported.  Strengths:  Ability to meet basic needs , Compliance with medical plan , Home prepared for child    Risk Factors/Current Problems:  None   Cognitive State:  Goal Oriented , Insightful , Able to Concentrate , Alert    Mood/Affect:  Calm , Comfortable , Interested , Happy    CSW Assessment: CSW met with MOB at bedside to complete assessment due to baby being a new NICU admit and MOB's hx of dep/anxiety and [facility] admits x 2 a few years ago. Upon this writers arrival, MOB was laying in bed awake. This Probation officer explained role and reasoning for visit. MOB was warm and welcoming. This Probation officer offered comfort and support to MOB due to baby being in NICU. MOB was thankful and noted baby is doing well and the plan is for her to d/c today from NICU and be at bedside with her. This Chief Strategy Officer MOB. MOB was thankful. This Probation officer explored MOB's behavioral  health hx. MOB was honest and fourth coming noting she did suffer from anxiety/depression in the past; however, has been managing well with it since 216 and is currently not on meds. This Chief Strategy Officer MOB on that accomplishment and further discussed PPD signs and symptoms to be aware of. MOB was thankful and verbalized understanding. This Probation officer informed MOB that a UDS was taken of baby due to her hx of (+) UDS screens in the past. MOB denies any current substance use and denies previous substance use. This Probation officer informed MOB that baby's UDS was (-) negative; however, CDS results are pending. MOB verbalized understanding. At this time, no other needs were addressed or requested thus, there are no barriers to d/c at this time.   CSW Plan/Description:  No Further Intervention Required/No Barriers to Discharge, Information/Referral to [employer] , [employer] , Other (Comment) (CSW will continue to follow pending CDS results)   Water quality scientist, MSW, [facility]  Office: [phone]

## 2016-08-03 NOTE — Anesthesia Postprocedure Evaluation (Signed)
Anesthesia Post Note  Patient: Tonya Harmon  Procedure(s) Performed: Procedure(s) (LRB): CESAREAN SECTION WITH BILATERAL TUBAL LIGATION (Bilateral)     Patient location during evaluation: Mother Baby Anesthesia Type: Spinal Level of consciousness: awake and alert, oriented and patient cooperative Pain management: pain level controlled Vital Signs Assessment: post-procedure vital signs reviewed and stable Respiratory status: spontaneous breathing Cardiovascular status: stable Postop Assessment: no headache, patient able to bend at knees, no signs of nausea or vomiting and spinal receding Anesthetic complications: no Comments: Pain score 3.    Last Vitals:  Vitals:   08/03/16 0345 08/03/16 0745  BP: 108/60 105/65  Pulse: (!) 57 (!) 55  Resp: 18 16  Temp: 36.8 C 36.6 C    Last Pain:  Vitals:   08/03/16 0745  TempSrc: Oral  PainSc: 2    Pain Goal: Patients Stated Pain Goal: 3 (08/02/16 1410)               Merrilyn PumaWRINKLE,Corbyn Wildey

## 2016-08-03 NOTE — Progress Notes (Signed)
Subjective: Postpartum Day 1: Cesarean Delivery, baby  In NICU due to TTN , doing well, likely release to regular nursery today Patient reports tolerating PO, + flatus and no problems voiding.    Objective: Vital signs in last 24 hours: Temp:  [96.6 F (35.9 C)-98.2 F (36.8 C)] 97.9 F (36.6 C) (06/02 0745) Pulse Rate:  [50-71] 55 (06/02 0745) Resp:  [11-24] 16 (06/02 0745) BP: (98-115)/(60-78) 105/65 (06/02 0745) SpO2:  [100 %] 100 % (06/02 0745)  Physical Exam:  General: alert, cooperative and no distress Lochia: appropriate Uterine Fundus: firm Incision: healing well, no significant drainage, no dehiscence, no significant erythema DVT Evaluation: No evidence of DVT seen on physical exam.   Recent Labs  08/01/16 1000 08/03/16 0535  HGB 12.0 10.6*  HCT 35.8* 31.1*    Assessment/Plan: Status post Cesarean section. Doing well postoperatively.  Continue current care. Discharge Sunday. Alliana Mcauliff V 08/03/2016, 9:31 AM

## 2016-08-04 MED ORDER — DOCUSATE SODIUM 100 MG PO CAPS: 100.0000 mg | ORAL_CAPSULE | Freq: Two times a day (BID) | ORAL | 2 refills | Status: DC | PRN

## 2016-08-04 MED ORDER — IBUPROFEN 600 MG PO TABS: 600.0000 mg | ORAL_TABLET | Freq: Four times a day (QID) | ORAL | 0 refills | Status: DC

## 2016-08-04 MED ORDER — OXYCODONE HCL 5 MG PO TABS: 5.0000 mg | ORAL_TABLET | ORAL | 0 refills | Status: DC | PRN

## 2016-08-04 NOTE — Lactation Note (Signed)
This note was copied from a baby's chart. Lactation Consultation Note  Patient Name: Tonya Harmon   Visited with Mom after baby roomed in all night after staying in NICU for observation.   Baby 46 hrs old.  Baby 8% weight loss, output adequate with stools changing to yellow.  Baby fed at the breast every 2 hrs during the night, for 20-45 minutes.  Mom states milk volume in, and baby is swallowing and softening the breast after a feeding.  Offered a latch assist/assess before discharge.  Mom to call for this. Mom states she has some tenderness at initial latch, but then it goes away.   Coconut oil given with instructions on use. Encouraged Mom to feed baby STS on early cue, goal of 8-12 feedings per 24 hrs. Talked about importance of a deep latch with wide mouth onto breast. Mom hasn't pumped all night, as baby fed directly from breast.  Manual pump made from pump parts and demonstrated how to use. Mom aware of OP lactation support, and encouraged to call prn for assistance with breastfeeding. Pediatrician appointment tomorrow.    Tonya Harmon, Tonya Harmon Harmon, 9:18 AM

## 2016-08-04 NOTE — Discharge Summary (Signed)
OB Discharge Summary     Patient Name: Tonya Harmon DOB: Oct 31, 1990 MRN: [id number]  Date of admission: 08/02/2016 Delivering MD: Tilda Burrow   Date of discharge: 08/04/2016  Admitting diagnosis: repeat c-section desires sterilization Intrauterine pregnancy: [redacted]w[redacted]d     Secondary diagnosis:  Active Problems:   Pregnancy with history of cesarean section, antepartum   Encounter for maternal care for low transverse scar from repeat cesarean delivery   Encounter for sterilization   Status post repeat low transverse cesarean section  Discharge diagnosis: Term Pregnancy Delivered                                                                                                Post partum procedures:postpartum tubal ligation  Hospital course:  Sceduled C/S   26 y.o. yo [id number] at [redacted]w[redacted]d was admitted to the hospital 08/02/2016 for scheduled cesarean section with the following indication:Prior Uterine Surgery.  Membrane Rupture Time/Date: 10:59 AM ,08/02/2016   Patient delivered a Viable infant.08/02/2016  Details of operation can be found in separate operative note.  Pateint had an uncomplicated postpartum course.  She is ambulating, tolerating a regular diet, passing flatus, and urinating well. Patient is discharged home in stable condition on  08/04/16         Physical exam  Vitals:   08/03/16 0745 08/03/16 1100 08/03/16 1700 08/04/16 0622  BP: 105/65 109/63 114/66 112/66  Pulse: (!) 55 63 64 73  Resp: 16 18 16 20   Temp: 97.9 F (36.6 C) 98.5 F (36.9 C) 98.4 F (36.9 C) 98.7 F (37.1 C)  TempSrc: Oral Oral Oral Oral  SpO2: 100%     Weight:      Height:       General: alert, cooperative and no distress Lochia: appropriate Uterine Fundus: firm Incision: Dressing is clean, dry, and intact DVT Evaluation: No evidence of DVT seen on physical exam. Labs: Lab Results  Component Value Date   WBC 7.3 08/03/2016   HGB 10.6 (L) 08/03/2016   HCT 31.1 (L) 08/03/2016   MCV  92.0 08/03/2016   PLT 190 08/03/2016   CMP Latest Ref Rng & Units 09/12/2013  Glucose 70 - 99 mg/dL 90  BUN 6 - 23 mg/dL 10  Creatinine 7.82 - 9.56 mg/dL 2.13  Sodium 086 - 578 mEq/L 140  Potassium 3.7 - 5.3 mEq/L 4.4  Chloride 96 - 112 mEq/L 101  CO2 19 - 32 mEq/L 31  Calcium 8.4 - 10.5 mg/dL 8.9  Total Protein 6.0 - 8.3 g/dL -  Total Bilirubin 0.3 - 1.2 mg/dL -  Alkaline Phos 39 - 469 U/L -  AST 0 - 37 U/L -  ALT 0 - 35 U/L -    Discharge instruction: per After Visit Summary and "Baby and Me Booklet".  After visit meds:  Allergies as of 08/04/2016      Reactions   Clonazepam Other (See Comments)   Homicidal thoughts   Tramadol Itching, Other (See Comments)   Really bad itching, seizures Pt reports being able to tolerate morphine, dilaudid, percocet, vicodin with no problems noted in the  past.   Flexeril [cyclobenzaprine Hcl] Other (See Comments)   'really bad muscle spasms'   Naproxen Nausea And Vomiting, Other (See Comments)   Bad abdominal pain Pt can take ibuprofen   Prednisone    Hallucinations,  Anger    Trazodone And Nefazodone Other (See Comments)   Night terror   Cefixime Hives   Vistaril [hydroxyzine Hcl] Other (See Comments)   'makes me really edgy'      Medication List    STOP taking these medications   acetaminophen 325 MG tablet Commonly known as:  TYLENOL   nitrofurantoin (macrocrystal-monohydrate) 100 MG capsule Commonly known as:  MACROBID   zolpidem 10 MG tablet Commonly known as:  AMBIEN     TAKE these medications   CITRANATAL ASSURE 35-1 & 300 MG tablet One tablet and one capsule daily   docusate sodium 100 MG capsule Commonly known as:  COLACE Take 1 capsule (100 mg total) by mouth 2 (two) times daily as needed.   ferrous sulfate 325 (65 FE) MG tablet Take 1 tablet (325 mg total) by mouth 2 (two) times daily with a meal.   ibuprofen 600 MG tablet Commonly known as:  ADVIL,MOTRIN Take 1 tablet (600 mg total) by mouth 4 (four)  times daily.   levETIRAcetam 500 MG tablet Commonly known as:  KEPPRA 500mg  once daily x 1 week, then 500mg  BID What changed:  how much to take  how to take this  when to take this  additional instructions   oxyCODONE 5 MG immediate release tablet Commonly known as:  Oxy IR/ROXICODONE Take 1 tablet (5 mg total) by mouth every 4 (four) hours as needed for moderate pain or severe pain.       Diet: routine diet  Activity: Advance as tolerated. Pelvic rest for 6 weeks.   Outpatient follow up:2 weeks Follow up Appt:Future Appointments Date Time Provider Department [facility]  08/09/2016 11:15 AM Lazaro ArmsEure, Luther H, MD [facility]   Follow up Visit:No Follow-up on file.  Newborn Data: Live born female  Birth Weight: 7 lb 2.6 oz (3250 g) APGAR: 6, 8  Baby Feeding: Breast Disposition:home with mother   08/04/2016 Catalina AntiguaPeggy Lavren Lewan, MD

## 2016-08-04 NOTE — Discharge Instructions (Signed)
Cesarean Delivery °Cesarean birth, or cesarean delivery, is the surgical delivery of a baby through an incision in the abdomen and the uterus. This may be referred to as a C-section. This procedure may be scheduled ahead of time, or it may be done in an emergency situation. °Tell a health care provider about: °· Any allergies you have. °· All medicines you are taking, including vitamins, herbs, eye drops, creams, and over-the-counter medicines. °· Any problems you or family members have had with anesthetic medicines. °· Any blood disorders you have. °· Any surgeries you have had. °· Any medical conditions you have. °· Whether you or any members of your family have a history of deep vein thrombosis (DVT) or pulmonary embolism (PE). °What are the risks? °Generally, this is a safe procedure. However, problems may occur, including: °· Infection. °· Bleeding. °· Allergic reactions to medicines. °· Damage to other structures or organs. °· Blood clots. °· Injury to your baby. ° °What happens before the procedure? °· Follow instructions from your health care provider about eating or drinking restrictions. °· Follow instructions from your health care provider about bathing before your procedure to help reduce your risk of infection. °· If you know that you are going to have a cesarean delivery, do not shave your pubic area. Shaving before the procedure may increase your risk of infection. °· Ask your health care provider about: °? Changing or stopping your regular medicines. This is especially important if you are taking diabetes medicines or blood thinners. °? Your pain management plan. This is especially important if you plan to breastfeed your baby. °? How long you will be in the hospital after the procedure. °? Any concerns you may have about receiving blood products if you need them during the procedure. °? Cord blood banking, if you plan to collect your baby’s umbilical cord blood. °· You may also want to ask your  health care provider: °? Whether you will be able to hold or breastfeed your baby while you are still in the operating room. °? Whether your baby can stay with you immediately after the procedure and during your recovery. °? Whether a family member or a person of your choice can go with you into the operating room and stay with you during the procedure, immediately after the procedure, and during your recovery. °· Plan to have someone drive you home when you are discharged from the hospital. °What happens during the procedure? °· Fetal monitors will be placed on your abdomen to monitor your heart rate and your baby's heart rate. °· Depending on the reason for your cesarean delivery, you may have a physical exam or additional testing, such as an ultrasound. °· An IV tube will be inserted into one of your veins. °· You may have your blood or urine tested. °· You will be given antibiotic medicine to help prevent infection. °· You may be given a special warming gown to wear to keep your temperature stable. °· Hair may be removed from your pubic area. °· The skin of your pubic area and lower abdomen will be cleaned with a germ-killing solution (antiseptic). °· A catheter may be inserted into your bladder through your urethra. This drains your urine during the procedure. °· You may be given one or more of the following: °? A medicine to numb the area (local anesthetic). °? A medicine to make you fall asleep (general anesthetic). °? A medicine (regional anesthetic) that is injected into your back or through a small   thin tube placed in your back (spinal anesthetic or epidural anesthetic). This numbs everything below the injection site and allows you to stay awake during your procedure. If this makes you feel nauseous, tell your health care provider. Medicines will be available to help reduce any nausea you may feel. °· An incision will be made in your abdomen, and then in your uterus. °· If you are awake during your  procedure, you may feel tugging and pulling in your abdomen, but you should not feel pain. If you feel pain, tell your health care provider immediately. °· Your baby will be removed from your uterus. You may feel more pressure or pushing while this happens. °· Immediately after birth, your baby will be dried and kept warm. You may be able to hold and breastfeed your baby. The umbilical cord may be clamped and cut during this time. °· Your placenta will be removed from your uterus. °· Your incisions will be closed with stitches (sutures). Staples, skin glue, or adhesive strips may also be applied to the incision in your abdomen. °· Bandages (dressings) will be placed over the incision in your abdomen. °The procedure may vary among health care providers and hospitals. °What happens after the procedure? °· Your blood pressure, heart rate, breathing rate, and blood oxygen level will be monitored often until the medicines you were given have worn off. °· You may continue to receive fluids and medicines through an IV tube. °· You will have some pain. Medicines will be available to help control your pain. °· To help prevent blood clots: °? You may be given medicines. °? You may have to wear compression stockings or devices. °? You will be encouraged to walk around when you are able. °· Hospital staff will encourage and support bonding with your baby. Your hospital may allow you and your baby to stay in the same room (rooming in) during your hospital stay to encourage successful breastfeeding. °· You may be encouraged to cough and breathe deeply often. This helps to prevent lung problems. °· If you have a catheter draining your urine, it will be removed as soon as possible after your procedure. °This information is not intended to replace advice given to you by your health care provider. Make sure you discuss any questions you have with your health care provider. °Document Released: 02/18/2005 Document Revised: 07/27/2015  Document Reviewed: 11/29/2014 °Elsevier Interactive Patient Education © 2017 [employer] ° °

## 2016-08-09 ENCOUNTER — Encounter: Payer: Self-pay | Admitting: Obstetrics & Gynecology

## 2016-08-09 ENCOUNTER — Ambulatory Visit (INDEPENDENT_AMBULATORY_CARE_PROVIDER_SITE_OTHER): Payer: Medicaid Other | Admitting: Obstetrics & Gynecology

## 2016-08-09 VITALS — BP 118/70 | HR 108 | Wt 131.0 lb

## 2016-08-09 DIAGNOSIS — Z9889 Other specified postprocedural states: Secondary | ICD-10-CM

## 2016-08-09 DIAGNOSIS — Z98891 History of uterine scar from previous surgery: Secondary | ICD-10-CM

## 2016-08-09 MED ORDER — HYDROCODONE-ACETAMINOPHEN 5-325 MG PO TABS: 1.0000 | ORAL_TABLET | Freq: Four times a day (QID) | ORAL | 0 refills | Status: DC | PRN

## 2016-08-09 NOTE — Progress Notes (Signed)
  HPI: Patient returns for routine postoperative follow-up having undergone repeat Caesarean section  on 08/02/2016.  The patient's immediate postoperative recovery has been unremarkable. Since hospital discharge the patient reports oxycodone makes her sick and does not work.   Current Outpatient Prescriptions: docusate sodium (COLACE) 100 MG capsule, Take 1 capsule (100 mg total) by mouth 2 (two) times daily as needed., Disp: 30 capsule, Rfl: 2 ferrous sulfate 325 (65 FE) MG tablet, Take 1 tablet (325 mg total) by mouth 2 (two) times daily with a meal., Disp: 60 tablet, Rfl: 3 ibuprofen (ADVIL,MOTRIN) 600 MG tablet, Take 1 tablet (600 mg total) by mouth 4 (four) times daily., Disp: 30 tablet, Rfl: 0 levETIRAcetam (KEPPRA) 500 MG tablet, 500mg  once daily x 1 week, then 500mg  BID (Patient taking differently: Take 500 mg by mouth 2 (two) times daily. Pt takes at dinner and bedtime), Disp: 30 tablet, Rfl: 6 Prenat w/o A-FeCbGl-DSS-FA-DHA (CITRANATAL ASSURE) 35-1 & 300 MG tablet, One tablet and one capsule daily, Disp: 60 tablet, Rfl: 11 HYDROcodone-acetaminophen (NORCO/VICODIN) 5-325 MG tablet, Take 1 tablet by mouth every 6 (six) hours as needed., Disp: 30 tablet, Rfl: 0  No current facility-administered medications for this visit.     Blood pressure 118/70, pulse (!) 108, weight 131 lb (59.4 kg), last menstrual period 11/03/2015, currently breastfeeding.  Physical Exam: Incision clean dry intact Steri strips removed gentian violet placed  Diagnostic Tests:   Pathology:   Impression: S/p repeat section with tubal ligation  Plan:  Meds ordered this encounter  Medications  . HYDROcodone-acetaminophen (NORCO/VICODIN) 5-325 MG tablet    Sig: Take 1 tablet by mouth every 6 (six) hours as needed.    Dispense:  30 tablet    Refill:  0    Post partum exam 4 weeks  Follow up: 4  weeks  Lazaro ArmsEURE,Beverlee Wilmarth H, MD

## 2016-08-16 ENCOUNTER — Encounter (HOSPITAL_COMMUNITY): Payer: Self-pay | Admitting: *Deleted

## 2016-09-06 ENCOUNTER — Encounter: Payer: Self-pay | Admitting: *Deleted

## 2016-09-06 ENCOUNTER — Ambulatory Visit: Payer: Medicaid Other | Admitting: Women's Health

## 2016-10-31 ENCOUNTER — Emergency Department (HOSPITAL_COMMUNITY)
Admission: EM | Admit: 2016-10-31 | Discharge: 2016-10-31 | Disposition: A | Discharge status: Home / Self Care | Payer: Medicaid Other | Attending: Emergency Medicine | Admitting: Emergency Medicine

## 2016-10-31 ENCOUNTER — Encounter (HOSPITAL_COMMUNITY): Payer: Self-pay | Admitting: Emergency Medicine

## 2016-10-31 DIAGNOSIS — Z87891 Personal history of nicotine dependence: Secondary | ICD-10-CM | POA: Diagnosis not present

## 2016-10-31 DIAGNOSIS — K0889 Other specified disorders of teeth and supporting structures: Secondary | ICD-10-CM | POA: Diagnosis present

## 2016-10-31 MED ORDER — PENICILLIN V POTASSIUM 500 MG PO TABS: 500.0000 mg | ORAL_TABLET | Freq: Four times a day (QID) | ORAL | 0 refills | Status: AC

## 2016-10-31 NOTE — ED Provider Notes (Signed)
[facility]PT Provider Note   CSN: [id number] Arrival date & time: 10/31/16  1031     History   Chief Complaint Chief Complaint  Patient presents with  . Dental Pain    HPI Tonya Harmon is a 26 y.o. female.  The history is provided by the patient.  Dental Pain   This is a new problem. The current episode started more than 1 week ago. The problem occurs constantly. The problem has not changed since onset.The pain is moderate. She has tried acetaminophen for the symptoms. The treatment provided no relief.    26 year old female who presents with dental pain that has been gradually worsening over the past 6 months. Associated with fracture of her teeth that was nontraumatic. Has noted some mild swelling around the left jaw with pain that radiates to the left temple. No fevers, difficulty swallowing, neck swelling, throat or tongue swelling, fevers or chills. Has tried Orajel, ibuprofen, and Tylenol without good effect. States that she does have dental appointment towards the end of September but unable to manage pain.  Past Medical History:  Diagnosis Date  . Anxiety   . Back pain   . Bipolar 1 disorder (HCC)   . Bipolar disorder (HCC)   . Common migraine with intractable migraine 06/08/2014  . Contraceptive management 10/05/2015  . Depression   . Epilepsy (HCC)   . GERD (gastroesophageal reflux disease)    pregnancy related-rolaids prn  . Headache(784.0)   . Insomnia   . Nexplanon in place 10/04/2015  . Pelvic pain in female 10/04/2015  . PONV (postoperative nausea and vomiting)   . Sciatica   . Seizures [facility])     Patient Active Problem List   Diagnosis Date Noted  . Encounter for maternal care for low transverse scar from repeat cesarean delivery 08/02/2016  . Encounter for sterilization 08/02/2016  . Status post repeat low transverse cesarean section 08/02/2016  . Substance abuse 03/14/2016  . Asymptomatic bacteriuria during pregnancy in first trimester  02/07/2016  . Supervision of normal pregnancy 01/31/2016  . Pregnancy with history of cesarean section, antepartum 01/31/2016  . Seizures (HCC) 06/08/2014  . Common migraine with intractable migraine 06/08/2014  . Axillary mass 09/07/2013  . Cellulitis of axilla, left 09/07/2013  . Anxiety 05/06/2012  . Depression 05/06/2012    Past Surgical History:  Procedure Laterality Date  . CESAREAN SECTION  2011,2008  . CESAREAN SECTION N/A 05/18/2012   Procedure: CESAREAN SECTION;  Surgeon: Tilda Burrow, MD;  Location: [facility] ORS;  Service: Obstetrics;  Laterality: N/A;  . CESAREAN SECTION WITH BILATERAL TUBAL LIGATION Bilateral 08/02/2016   Procedure: CESAREAN SECTION WITH BILATERAL TUBAL LIGATION;  Surgeon: Tilda Burrow, MD;  Location: [facility] BIRTHING SUITES;  Service: Obstetrics;  Laterality: Bilateral;  . INCISION AND DRAINAGE ABSCESS Left 09/10/2013   Procedure: INCISION AND DRAINAGE ABSCESS, left breast/axilla;  Surgeon: Dalia Heading, MD;  Location: [facility] ORS;  Service: General;  Laterality: Left;    OB History    Gravida Para Term Preterm AB Living   4 4 4     3    SAB TAB Ectopic Multiple Live Births         0 3       Home Medications    Prior to Admission medications   Medication Sig Start Date End Date Taking? Authorizing Provider  acetaminophen (TYLENOL) 500 MG tablet Take 500 mg by mouth every 6 (six) hours as needed.   Yes [provider]  ibuprofen (ADVIL,MOTRIN)  200 MG tablet Take 200 mg by mouth every 6 (six) hours as needed.   Yes [provider]  docusate sodium (COLACE) 100 MG capsule Take 1 capsule (100 mg total) by mouth 2 (two) times daily as needed. Patient not taking: Reported on 10/31/2016 08/04/16   Constant, Peggy, MD  HYDROcodone-acetaminophen (NORCO/VICODIN) 5-325 MG tablet Take 1 tablet by mouth every 6 (six) hours as needed. Patient not taking: Reported on 10/31/2016 08/09/16   Lazaro ArmsEure, Luther H, MD  ibuprofen (ADVIL,MOTRIN) 600 MG tablet Take 1 tablet  (600 mg total) by mouth 4 (four) times daily. Patient not taking: Reported on 10/31/2016 08/04/16   Constant, Peggy, MD  levETIRAcetam (KEPPRA) 500 MG tablet 500mg  once daily x 1 week, then 500mg  BID Patient not taking: Reported on 10/31/2016 01/31/16   Cheral MarkerBooker, Kimberly R, CNM  penicillin v potassium (VEETID) 500 MG tablet Take 1 tablet (500 mg total) by mouth 4 (four) times daily. 10/31/16 11/07/16  Lavera GuiseLiu, Natesha Hassey Duo, MD  Prenat w/o A-FeCbGl-DSS-FA-DHA [facility](CITRANATAL ASSURE) 35-1 & 300 MG tablet One tablet and one capsule daily Patient not taking: Reported on 10/31/2016 03/13/16   Cheral MarkerBooker, Kimberly R, CNM    Family History Family History  Problem Relation Age of Onset  . Diabetes Mother   . Fibromyalgia Mother   . Rheum arthritis Mother   . Diabetes Other   . Hypertension Other   . Cancer Other   . Coronary artery disease Other   . Tremor Maternal Grandmother   . Cancer Maternal Grandmother        breast, uterine  . Alzheimer's disease Maternal Grandmother   . Stroke Maternal Grandmother   . Coronary artery disease Maternal Grandfather   . Diabetes Maternal Grandfather   . Hypertension Maternal Grandfather   . Diabetes Paternal Grandmother   . Hypertension Paternal Grandmother   . Cancer Paternal Grandmother        breast  . Cancer Paternal Grandfather        pancreatic  . Hypertension Paternal Grandfather   . Seizures Neg Hx     Social History Social History  Substance Use Topics  . Smoking status: Former Smoker    Types: Cigarettes  . Smokeless tobacco: Never Used  . Alcohol use No     Allergies   Clonazepam; Tramadol; Flexeril [cyclobenzaprine hcl]; Naproxen; Prednisone; Trazodone and nefazodone; Cefixime; and Vistaril [hydroxyzine hcl]   Review of Systems Review of Systems  Constitutional: Negative for fever.  HENT: Negative for congestion and sore throat.   Respiratory: Negative for shortness of breath.   Cardiovascular: Negative for chest pain.  Allergic/Immunologic:  Negative for immunocompromised state.  Neurological: Positive for headaches.     Physical Exam Updated Vital Signs BP 127/81 (BP Location: Right Arm)   Pulse (!) 102   Temp 98.1 F (36.7 C) (Oral)   Resp 20   Ht 5\' 5"  (1.651 m)   Wt 49.9 kg (110 lb)   SpO2 98%   BMI 18.30 kg/m   Physical Exam   ED Treatments / Results  Labs (all labs ordered are listed, but only abnormal results are displayed) Labs Reviewed - No data to display  EKG  EKG Interpretation None       Radiology No results found.  Procedures Procedures (including critical care time)  Medications Ordered in ED Medications - No data to display   Initial Impression / Assessment and Plan / ED Course  I have reviewed the triage vital signs and the nursing notes.  Pertinent  labs & imaging results that were available during my care of the patient were reviewed by me and considered in my medical decision making (see chart for details).     Presents with dental pain. Fractured tooth 16, 25, 28, and 18.with tenderness to percussion. No facial swelling.afebrile, breathing comfortably. Normal range of motion of back. Possible periapical abscess but no drainable abscess. Possible periostitis. She is encouraged to follow-up with her dentist as scheduled later in the month. Will trial course of antibiotics. She will continue otc analgesics. Strict return and follow-up instructions reviewed. She expressed understanding of all discharge instructions and felt comfortable with the plan of care.   Final Clinical Impressions(s) / ED Diagnoses   Final diagnoses:  Pain, dental    New Prescriptions New Prescriptions   PENICILLIN V POTASSIUM (VEETID) 500 MG TABLET    Take 1 tablet (500 mg total) by mouth 4 (four) times daily.     Lavera Guise, MD 10/31/16 1213

## 2016-10-31 NOTE — ED Triage Notes (Signed)
Broken upper teeth bilaterally causing pain, difficulty chewing

## 2016-10-31 NOTE — Discharge Instructions (Signed)
Please take antibiotics as prescribed Follow-up with dentist as scheduled Take ibuprofen and tylenol and use heat packs Return for worsening symptoms, including neck swelling/facial swelling, fever, or any other symptoms concerning to you.

## 2016-12-02 ENCOUNTER — Encounter: Payer: Self-pay | Admitting: Obstetrics & Gynecology

## 2016-12-18 ENCOUNTER — Ambulatory Visit (HOSPITAL_COMMUNITY)
Admission: RE | Admit: 2016-12-18 | Discharge: 2016-12-18 | Disposition: A | Discharge status: Home / Self Care | Payer: Medicaid Other | Source: Ambulatory Visit | Attending: Family Medicine | Admitting: Family Medicine

## 2016-12-18 ENCOUNTER — Other Ambulatory Visit: Payer: Self-pay | Admitting: Women's Health

## 2016-12-18 ENCOUNTER — Other Ambulatory Visit (HOSPITAL_COMMUNITY): Payer: Self-pay | Admitting: Family Medicine

## 2016-12-18 DIAGNOSIS — M25562 Pain in left knee: Secondary | ICD-10-CM | POA: Insufficient documentation

## 2016-12-18 DIAGNOSIS — M25561 Pain in right knee: Secondary | ICD-10-CM | POA: Diagnosis present

## 2016-12-18 DIAGNOSIS — R52 Pain, unspecified: Secondary | ICD-10-CM

## 2016-12-30 ENCOUNTER — Ambulatory Visit (INDEPENDENT_AMBULATORY_CARE_PROVIDER_SITE_OTHER): Payer: Medicaid Other | Admitting: Women's Health

## 2016-12-30 ENCOUNTER — Encounter: Payer: Self-pay | Admitting: Women's Health

## 2016-12-30 ENCOUNTER — Telehealth: Payer: Self-pay | Admitting: *Deleted

## 2016-12-30 ENCOUNTER — Encounter: Payer: Self-pay | Admitting: Adult Health

## 2016-12-30 VITALS — BP 100/60 | HR 95 | Wt 115.6 lb

## 2016-12-30 DIAGNOSIS — L7682 Other postprocedural complications of skin and subcutaneous tissue: Secondary | ICD-10-CM | POA: Diagnosis not present

## 2016-12-30 DIAGNOSIS — Z98891 History of uterine scar from previous surgery: Secondary | ICD-10-CM

## 2016-12-30 DIAGNOSIS — M792 Neuralgia and neuritis, unspecified: Secondary | ICD-10-CM | POA: Diagnosis not present

## 2016-12-30 MED ORDER — GABAPENTIN 300 MG PO CAPS: 300.0000 mg | ORAL_CAPSULE | Freq: Three times a day (TID) | ORAL | 0 refills | Status: DC

## 2016-12-30 NOTE — Telephone Encounter (Signed)
Error

## 2016-12-30 NOTE — Progress Notes (Signed)
   GYN VISIT Patient name: Tonya Harmon MRN [id number]  Date of birth: 12/11/1990 Chief Complaint:   incision pain/ red (had c-section)  History of Present Illness:   Tonya Harmon is a 26 y.o. [id number] Caucasian female being seen today as work-in for report of c/s incision redness/pain.  Had RCS w/ BTL 08/02/16, had normal post-op visit 1wk later, never came for pp visit. States pain is constant, all along incision, burning. Ibuprofen doesn't help. Went to PCP who treated her for UTI, no improvement in sx. Bottlefeeding. H/O opiate addiction. Per DEA website query: Hydrocodone rx'd by Dillard EssexAlison Marshall in Aug and Sept, then Hydrocodone 5/325mg  #60 rx'd by Dr. Sudie BaileyKnowlton 10/5, then hydrocodone 10/325mg  #120 rx'd by Dr. Sudie BaileyKnowlton 10/17  No LMP recorded. The current method of family planning is tubal ligation. Last pap 10/04/15. Results were:  normal Review of Systems:   Pertinent items are noted in HPI Denies fever/chills, dizziness, headaches, visual disturbances, fatigue, shortness of breath, chest pain, abdominal pain, vomiting, abnormal vaginal discharge/itching/odor/irritation, problems with periods, bowel movements, urination, or intercourse unless otherwise stated above.  Pertinent History Reviewed:  Reviewed past medical,surgical, social, obstetrical and family history.  Reviewed problem list, medications and allergies. Physical Assessment:   Vitals:   12/30/16 1402  BP: 100/60  Pulse: 95  Weight: 115 lb 9.6 oz (52.4 kg)  Body mass index is 19.24 kg/m.       Physical Examination:   General appearance: alert, well appearing, and in no distress  Mental status: alert, oriented to person, place, and time  Skin: warm & dry   Cardiovascular: normal heart rate noted   Respiratory: normal respiratory effort, no distress   Abdomen: soft, non-tender, c/s incision well-healed, no erythema, induration, signs of infection. + tenderness to palpation over entire incision.  No evidence of seroma/hematoma, etc. Co-exam w/ [facility] who concurs   Pelvic: examination not indicated  Extremities: no edema   No results found for this or any previous visit (from the past 24 hour(s)).  Assessment & Plan:  1) 5mths s/p RCS w/ BTL  2) C/S incision well-healed, no signs of infection  3) Neurogenic pain of c/s incision> per [facility], recommends gabapentin 300mg  TID- rx sent w/ 0RF, f/u 4wks  Return in about 4 weeks (around 01/27/2017) for F/U w/ JVF.who did c/s  Marge DuncansBooker, Donnalynn Wheeless Randall CNM, [facility]NP-BC 12/30/2016 2:55 PM

## 2016-12-30 NOTE — Telephone Encounter (Signed)
Patient called with complaints of c/s incision burning and redness. She went to her PCP and was told to see Tonya Harmon since Dr Emelda FearFerguson did the surgery and stated she needed to be seen soon. Will work patient in to assess for infection.

## 2017-01-14 ENCOUNTER — Ambulatory Visit (HOSPITAL_COMMUNITY)
Admission: RE | Admit: 2017-01-14 | Discharge: 2017-01-14 | Disposition: A | Discharge status: Home / Self Care | Payer: Medicaid Other | Source: Ambulatory Visit | Attending: Family Medicine | Admitting: Family Medicine

## 2017-01-14 ENCOUNTER — Other Ambulatory Visit (HOSPITAL_COMMUNITY): Payer: Self-pay | Admitting: Family Medicine

## 2017-01-14 DIAGNOSIS — M545 Low back pain: Secondary | ICD-10-CM | POA: Diagnosis present

## 2017-01-27 ENCOUNTER — Ambulatory Visit: Payer: Medicaid Other | Admitting: Obstetrics & Gynecology

## 2017-01-27 ENCOUNTER — Encounter: Payer: Self-pay | Admitting: Obstetrics & Gynecology

## 2017-04-15 ENCOUNTER — Other Ambulatory Visit: Payer: Self-pay | Admitting: Women's Health

## 2017-07-04 ENCOUNTER — Other Ambulatory Visit: Payer: Self-pay

## 2017-07-04 ENCOUNTER — Emergency Department (HOSPITAL_COMMUNITY)
Admission: EM | Admit: 2017-07-04 | Discharge: 2017-07-04 | Disposition: A | Discharge status: Home / Self Care | Payer: Medicaid Other | Attending: Emergency Medicine | Admitting: Emergency Medicine

## 2017-07-04 ENCOUNTER — Emergency Department (HOSPITAL_COMMUNITY): Payer: Medicaid Other

## 2017-07-04 ENCOUNTER — Encounter (HOSPITAL_COMMUNITY): Payer: Self-pay | Admitting: Emergency Medicine

## 2017-07-04 DIAGNOSIS — Y999 Unspecified external cause status: Secondary | ICD-10-CM | POA: Insufficient documentation

## 2017-07-04 DIAGNOSIS — W231XXA Caught, crushed, jammed, or pinched between stationary objects, initial encounter: Secondary | ICD-10-CM | POA: Insufficient documentation

## 2017-07-04 DIAGNOSIS — Z87891 Personal history of nicotine dependence: Secondary | ICD-10-CM | POA: Insufficient documentation

## 2017-07-04 DIAGNOSIS — Z79899 Other long term (current) drug therapy: Secondary | ICD-10-CM | POA: Diagnosis not present

## 2017-07-04 DIAGNOSIS — Y929 Unspecified place or not applicable: Secondary | ICD-10-CM | POA: Diagnosis not present

## 2017-07-04 DIAGNOSIS — Y939 Activity, unspecified: Secondary | ICD-10-CM | POA: Diagnosis not present

## 2017-07-04 DIAGNOSIS — S92514A Nondisplaced fracture of proximal phalanx of right lesser toe(s), initial encounter for closed fracture: Secondary | ICD-10-CM | POA: Insufficient documentation

## 2017-07-04 DIAGNOSIS — S99921A Unspecified injury of right foot, initial encounter: Secondary | ICD-10-CM | POA: Diagnosis present

## 2017-07-04 NOTE — ED Triage Notes (Signed)
Patient c/o right 3rd and 4th toe pain that radiates into foot and ankle. Per patient oak table accidentally dropped onto toes on 4/21. Patient reports taking ibuprofen and Vicodin for pain with no relief-last took  of ibuprofen at 8am this morning.

## 2017-07-04 NOTE — Discharge Instructions (Addendum)
Wear the post op shoe at all times when up and walking to protect your toe against movement as these bones are trying to heal back.  Wear the buddy tape at all times, even when sleeping, again to protect the injury.  You may use your home vicodin that is already prescribed to you for your pain.

## 2017-07-05 NOTE — ED Provider Notes (Signed)
[facility] EMERGENCY DEPARTMENT Provider Note   CSN: [id number] Arrival date & time: 07/04/17  1809     History   Chief Complaint Chief Complaint  Patient presents with  . Toe Pain    HPI Tonya Harmon is a 27 y.o. female presenting with a 2 week history of right foot pain since an oak table leg was accidentally dropped on her foot Easter Sunday.  She endorses initially having pain in the 3rd, 4th and 5th toes, but now reports only persistent pain in the 5th toe, worsened with movement.  She has taken ibuprofen and tylenol without relief of pain and initially used ice which helped with swelling. She has not contacted her pcp regarding this injury.  The history is provided by the patient.    Past Medical History:  Diagnosis Date  . Anxiety   . Back pain   . Bipolar 1 disorder (HCC)   . Bipolar disorder (HCC)   . Common migraine with intractable migraine 06/08/2014  . Contraceptive management 10/05/2015  . Depression   . Epilepsy (HCC)   . GERD (gastroesophageal reflux disease)    pregnancy related-rolaids prn  . Headache(784.0)   . Insomnia   . Nexplanon in place 10/04/2015  . Pelvic pain in female 10/04/2015  . PONV (postoperative nausea and vomiting)   . Sciatica   . Seizures [facility])     Patient Active Problem List   Diagnosis Date Noted  . Encounter for sterilization 08/02/2016  . Substance abuse (HCC) 03/14/2016  . Asymptomatic bacteriuria during pregnancy in first trimester 02/07/2016  . History of cesarean section 01/31/2016  . Seizures (HCC) 06/08/2014  . Common migraine with intractable migraine 06/08/2014  . Axillary mass 09/07/2013  . Cellulitis of axilla, left 09/07/2013  . Anxiety 05/06/2012  . Depression 05/06/2012    Past Surgical History:  Procedure Laterality Date  . CESAREAN SECTION  2011,2008  . CESAREAN SECTION N/A 05/18/2012   Procedure: CESAREAN SECTION;  Surgeon: Tilda Burrow, MD;  Location: [facility];  Service: Obstetrics;   Laterality: N/A;  . CESAREAN SECTION WITH BILATERAL TUBAL LIGATION Bilateral 08/02/2016   Procedure: CESAREAN SECTION WITH BILATERAL TUBAL LIGATION;  Surgeon: Tilda Burrow, MD;  Location: [facility] BIRTHING SUITES;  Service: Obstetrics;  Laterality: Bilateral;  . INCISION AND DRAINAGE ABSCESS Left 09/10/2013   Procedure: INCISION AND DRAINAGE ABSCESS, left breast/axilla;  Surgeon: Dalia Heading, MD;  Location: [facility];  Service: General;  Laterality: Left;     OB History    Gravida  4   Para  4   Term  4   Preterm      AB      Living  3     SAB      TAB      Ectopic      Multiple  0   Live Births  3            Home Medications    Prior to Admission medications   Medication Sig Start Date End Date Taking? Authorizing Provider  acetaminophen (TYLENOL) 500 MG tablet Take 500 mg by mouth every 6 (six) hours as needed.    [provider]  docusate sodium (COLACE) 100 MG capsule Take 1 capsule (100 mg total) by mouth 2 (two) times daily as needed. 08/04/16   Constant, Peggy, MD  gabapentin (NEURONTIN) 300 MG capsule Take 1 capsule (300 mg total) by mouth 3 (three) times daily. 12/30/16   Cheral Marker, CNM  HYDROcodone-acetaminophen (NORCO/VICODIN) 5-325 MG tablet Take 1 tablet by mouth every 6 (six) hours as needed. Patient not taking: Reported on 10/31/2016 08/09/16   Lazaro Arms, MD  ibuprofen (ADVIL,MOTRIN) 200 MG tablet Take 200 mg by mouth every 6 (six) hours as needed.    [provider]  ibuprofen (ADVIL,MOTRIN) 600 MG tablet Take 1 tablet (600 mg total) by mouth 4 (four) times daily. 08/04/16   Constant, Peggy, MD  levETIRAcetam (KEPPRA) 500 MG tablet TAKE 1 TABLET BY MOUTH ONCE DAILY FOR 7 DAYS, THEN TAKE 1 TABLET TWICE DAILY 12/18/16   Cheral Marker, CNM  Prenat w/o A-FeCbGl-DSS-FA-DHA [facility] ASSURE) 35-1 & 300 MG tablet One tablet and one capsule daily Patient not taking: Reported on 10/31/2016 03/13/16   Cheral Marker, CNM     Family History Family History  Problem Relation Age of Onset  . Diabetes Mother   . Fibromyalgia Mother   . Rheum arthritis Mother   . Diabetes Other   . Hypertension Other   . Cancer Other   . Coronary artery disease Other   . Tremor Maternal Grandmother   . Cancer Maternal Grandmother        breast, uterine  . Alzheimer's disease Maternal Grandmother   . Stroke Maternal Grandmother   . Coronary artery disease Maternal Grandfather   . Diabetes Maternal Grandfather   . Hypertension Maternal Grandfather   . Diabetes Paternal Grandmother   . Hypertension Paternal Grandmother   . Cancer Paternal Grandmother        breast  . Cancer Paternal Grandfather        pancreatic  . Hypertension Paternal Grandfather   . Seizures Neg Hx     Social History Social History   Tobacco Use  . Smoking status: Former Smoker    Types: Cigarettes  . Smokeless tobacco: Never Used  Substance Use Topics  . Alcohol use: No  . Drug use: No     Allergies   Clonazepam; Tramadol; Flexeril [cyclobenzaprine hcl]; Naproxen; Prednisone; Trazodone and nefazodone; Cefixime; and Vistaril [hydroxyzine hcl]   Review of Systems Review of Systems  Constitutional: Negative for fever.  Musculoskeletal: Positive for arthralgias and joint swelling. Negative for myalgias.  Neurological: Negative for weakness and numbness.     Physical Exam Updated Vital Signs BP 106/72 (BP Location: Right Arm)   Pulse 95   Temp 98.6 F (37 C) (Oral)   Resp 17   Ht  (1.651 m)   Wt 46.3 kg (102 lb)   SpO2 97%   BMI 16.97 kg/m   Physical Exam  Constitutional: She appears well-developed and well-nourished.  HENT:  Head: Atraumatic.  Neck: Normal range of motion.  Cardiovascular:  Pulses equal bilaterally  Musculoskeletal: She exhibits tenderness.       Right foot: There is decreased range of motion, bony tenderness and swelling. There is normal capillary refill, no crepitus and no deformity.        Feet:  Neurological: She is alert. She has normal strength. She displays normal reflexes. No sensory deficit.  Skin: Skin is warm and dry.  Psychiatric: She has a normal mood and affect.     ED Treatments / Results  Labs (all labs ordered are listed, but only abnormal results are displayed) Labs Reviewed - No data to display  EKG None  Radiology Dg Toe 5th Right  Result Date: 07/04/2017 CLINICAL DATA:  Toe pain after injury EXAM: RIGHT FIFTH TOE COMPARISON:  None. FINDINGS: Acute fracture proximal shaft  and metaphysis of the fifth proximal phalanx with mild medial displacement of distal fracture fragment and minimal dorsal angulation. No subluxation. No definitive articular extension on the submitted views. IMPRESSION: Acute mildly displaced and angulated fracture involving the proximal aspect of the fifth proximal phalanx Electronically Signed   By: Jasmine Pang M.D.   On: 07/04/2017 19:18    Procedures Procedures (including critical care time)  Medications Ordered in ED Medications - No data to display   Initial Impression / Assessment and Plan / ED Course  I have reviewed the triage vital signs and the nursing notes.  Pertinent labs & imaging results that were available during my care of the patient were reviewed by me and considered in my medical decision making (see chart for details).     Imaging reviewed with patient, confirming right 5th toe fracture. Post op shoe and buddy tape provided. Home instructions discussed.   Referral to ortho for f/u care recommended.    Initially, pt denied taking vicodin for pain relief, which was asked since it was mentioned in triage note.  Pt then stated she had one left over from having her baby last fall and took it last week, has had none since.  Review of the [facility] database reveals pt receives 120 vicodin tablets monthly for chronic pain from local provider for at least the past 2 years. When this was mentioned to the patient she stated  "but it doesn't help my pain".  When asked why she keeps getting it filled she stated "because that's all I've got".  She was asked if she has had this conversation with her pcp, but she would not respond to this question.  Pt was not given pain medication despite her displeasure. She was advised she needs to f/u with her pcp for further conversation regarding her chronic pain issues.  Final Clinical Impressions(s) / ED Diagnoses   Final diagnoses:  Closed nondisplaced fracture of proximal phalanx of lesser toe of right foot, initial encounter    ED Discharge Orders    None       Victoriano Lain 07/05/17 1115    Bethann Berkshire, MD 07/05/17 1550

## 2018-04-20 ENCOUNTER — Ambulatory Visit: Payer: Medicaid Other | Admitting: Neurology

## 2018-06-08 ENCOUNTER — Ambulatory Visit: Payer: Medicaid Other | Admitting: Neurology

## 2018-08-03 ENCOUNTER — Telehealth: Payer: Self-pay | Admitting: *Deleted

## 2018-08-03 NOTE — Telephone Encounter (Addendum)
Called pt on home # to offer in-office visit vs virtual. Received no answer and vm not setup. Also tried to call mobile number. VM box full.

## 2018-08-04 NOTE — Telephone Encounter (Signed)
Tried to reach pt on both numbers on chart to discuss options for visit on 6/4. VM not setup on home # and vm full on mobile. Appt will be canceled if pt doesn't return call by tomorrow.

## 2018-08-05 NOTE — Telephone Encounter (Signed)
Tried to reach pt once more on home number. Her father answered. He confirmed this is the correct number but stated pt has a brain tumor and is in [place] currently and will not be back by tomorrow. I advised I would cancel the appt and left the office number with him to have pt call back and reschedule. He verbalized appreciation and said he would give her the message.

## 2018-08-06 ENCOUNTER — Ambulatory Visit: Payer: Medicaid Other | Admitting: Neurology

## 2019-04-05 ENCOUNTER — Encounter: Payer: Self-pay | Admitting: Family Medicine

## 2020-07-17 ENCOUNTER — Encounter (HOSPITAL_COMMUNITY): Payer: Self-pay | Admitting: *Deleted

## 2020-07-17 ENCOUNTER — Emergency Department (HOSPITAL_COMMUNITY)
Admission: EM | Admit: 2020-07-17 | Discharge: 2020-07-18 | Disposition: A | Discharge status: Home / Self Care | Payer: Medicaid Other | Attending: Emergency Medicine | Admitting: Emergency Medicine

## 2020-07-17 ENCOUNTER — Other Ambulatory Visit: Payer: Self-pay

## 2020-07-17 DIAGNOSIS — Z87891 Personal history of nicotine dependence: Secondary | ICD-10-CM | POA: Diagnosis not present

## 2020-07-17 DIAGNOSIS — L02411 Cutaneous abscess of right axilla: Secondary | ICD-10-CM | POA: Diagnosis present

## 2020-07-17 MED ORDER — DOXYCYCLINE HYCLATE 100 MG PO CAPS: 100.0000 mg | ORAL_CAPSULE | Freq: Two times a day (BID) | ORAL | 0 refills | Status: DC

## 2020-07-17 MED ORDER — LIDOCAINE-EPINEPHRINE (PF) 1 %-1:200000 IJ SOLN: 20.0000 mL | Freq: Once | INTRAMUSCULAR | Status: DC

## 2020-07-17 MED ORDER — DOXYCYCLINE HYCLATE 100 MG PO TABS
100.0000 mg | ORAL_TABLET | Freq: Once | ORAL | Status: AC
  Administered 2020-07-18: 100 mg via ORAL
  Filled 2020-07-17: qty 1

## 2020-07-17 MED ORDER — LIDOCAINE-EPINEPHRINE (PF) 2 %-1:200000 IJ SOLN
INTRAMUSCULAR | Status: AC
  Filled 2020-07-17: qty 20

## 2020-07-17 NOTE — Discharge Instructions (Signed)
Apply warm compresses to promote drainage.  Please be aware that abscess is in this area tends to recur.  You may need to see a surgeon about a more definitive procedure.

## 2020-07-17 NOTE — ED Triage Notes (Signed)
Pt with right axillary abscess, has been taking ibuprofen and antibiotic. 6th time in a month per pt.

## 2020-07-17 NOTE — ED Provider Notes (Signed)
[facility] EMERGENCY DEPARTMENT Provider Note   CSN: [id number] Arrival date & time: 07/17/20  1907     History Chief Complaint  Patient presents with  . Abscess    Tonya Harmon is a 30 y.o. female.  The history is provided by the patient.  Abscess She complains of a painful abscess on her right axilla.  She has been having abscesses there frequently over the last month and has been trying to treat them with antibiotics.  She has run fevers as high as 101.  She had similar problems with abscesses in the left axilla which were treated with surgical procedure.  Past Medical History:  Diagnosis Date  . Anxiety   . Back pain   . Bipolar 1 disorder (HCC)   . Bipolar disorder (HCC)   . Common migraine with intractable migraine 06/08/2014  . Contraceptive management 10/05/2015  . Depression   . Epilepsy (HCC)   . GERD (gastroesophageal reflux disease)    pregnancy related-rolaids prn  . Headache(784.0)   . Insomnia   . Nexplanon in place 10/04/2015  . Pelvic pain in female 10/04/2015  . PONV (postoperative nausea and vomiting)   . Sciatica   . Seizures [facility])     Patient Active Problem List   Diagnosis Date Noted  . Encounter for sterilization 08/02/2016  . Substance abuse (HCC) 03/14/2016  . Asymptomatic bacteriuria during pregnancy in first trimester 02/07/2016  . History of cesarean section 01/31/2016  . Seizures (HCC) 06/08/2014  . Common migraine with intractable migraine 06/08/2014  . Axillary mass 09/07/2013  . Cellulitis of axilla, left 09/07/2013  . Anxiety 05/06/2012  . Depression 05/06/2012    Past Surgical History:  Procedure Laterality Date  . CESAREAN SECTION  2011,2008  . CESAREAN SECTION N/A 05/18/2012   Procedure: CESAREAN SECTION;  Surgeon: Tilda Burrow, MD;  Location: [facility];  Service: Obstetrics;  Laterality: N/A;  . CESAREAN SECTION WITH BILATERAL TUBAL LIGATION Bilateral 08/02/2016   Procedure: CESAREAN SECTION WITH BILATERAL TUBAL  LIGATION;  Surgeon: Tilda Burrow, MD;  Location: [facility] BIRTHING SUITES;  Service: Obstetrics;  Laterality: Bilateral;  . INCISION AND DRAINAGE ABSCESS Left 09/10/2013   Procedure: INCISION AND DRAINAGE ABSCESS, left breast/axilla;  Surgeon: Dalia Heading, MD;  Location: [facility];  Service: General;  Laterality: Left;  . TUBAL LIGATION       OB History    Gravida  4   Para  4   Term  4   Preterm      AB      Living  3     SAB      IAB      Ectopic      Multiple  0   Live Births  3           Family History  Problem Relation Age of Onset  . Diabetes Mother   . Fibromyalgia Mother   . Rheum arthritis Mother   . Diabetes Other   . Hypertension Other   . Cancer Other   . Coronary artery disease Other   . Tremor Maternal Grandmother   . Cancer Maternal Grandmother        breast, uterine  . Alzheimer's disease Maternal Grandmother   . Stroke Maternal Grandmother   . Coronary artery disease Maternal Grandfather   . Diabetes Maternal Grandfather   . Hypertension Maternal Grandfather   . Diabetes Paternal Grandmother   . Hypertension Paternal Grandmother   . Cancer Paternal Grandmother  breast  . Cancer Paternal Grandfather        pancreatic  . Hypertension Paternal Grandfather   . Seizures Neg Hx     Social History   Tobacco Use  . Smoking status: Former Smoker    Types: Cigarettes  . Smokeless tobacco: Never Used  Vaping Use  . Vaping Use: Never used  Substance Use Topics  . Alcohol use: No  . Drug use: No    Home Medications Prior to Admission medications   Medication Sig Start Date End Date Taking? Authorizing Provider  acetaminophen (TYLENOL) 500 MG tablet Take 500 mg by mouth every 6 (six) hours as needed.    [provider]  docusate sodium (COLACE) 100 MG capsule Take 1 capsule (100 mg total) by mouth 2 (two) times daily as needed. 08/04/16   Constant, Peggy, MD  gabapentin (NEURONTIN) 300 MG capsule Take 1 capsule (300 mg  total) by mouth 3 (three) times daily. 12/30/16   Cheral Marker, CNM  HYDROcodone-acetaminophen (NORCO/VICODIN) 5-325 MG tablet Take 1 tablet by mouth every 6 (six) hours as needed. Patient not taking: Reported on 10/31/2016 08/09/16   Lazaro Arms, MD  ibuprofen (ADVIL,MOTRIN) 200 MG tablet Take 200 mg by mouth every 6 (six) hours as needed.    [provider]  ibuprofen (ADVIL,MOTRIN) 600 MG tablet Take 1 tablet (600 mg total) by mouth 4 (four) times daily. 08/04/16   Constant, Peggy, MD  levETIRAcetam (KEPPRA) 500 MG tablet TAKE 1 TABLET BY MOUTH ONCE DAILY FOR 7 DAYS, THEN TAKE 1 TABLET TWICE DAILY 12/18/16   Cheral Marker, CNM  Prenat w/o A-FeCbGl-DSS-FA-DHA ([employer] ASSURE) 35-1 & 300 MG tablet One tablet and one capsule daily Patient not taking: Reported on 10/31/2016 03/13/16   Cheral Marker, CNM    Allergies    Clonazepam, Tramadol, Flexeril [cyclobenzaprine hcl], Naproxen, Prednisone, Seroquel [quetiapine], Trazodone and nefazodone, Cefixime, and Vistaril [hydroxyzine hcl]  Review of Systems   Review of Systems  All other systems reviewed and are negative.   Physical Exam Updated Vital Signs BP 113/69 (BP Location: Left Arm)   Pulse 93   Temp 98.1 F (36.7 C) (Oral)   Resp 16   Ht 5\' 5"  (1.651 m)   Wt 49.4 kg   LMP 06/26/2020   SpO2 100%   BMI 18.14 kg/m   Physical Exam Vitals and nursing note reviewed.   30 year old female, resting comfortably and in no acute distress. Vital signs are normal. Oxygen saturation is 100%, which is normal. Head is normocephalic and atraumatic. PERRLA, EOMI. Oropharynx is clear. Neck is nontender and supple without adenopathy or JVD. Back is nontender and there is no CVA tenderness. Lungs are clear without rales, wheezes, or rhonchi. Chest is nontender. Heart has regular rate and rhythm without murmur. Abdomen is soft, flat, nontender without masses or hepatosplenomegaly and peristalsis is  normoactive. Extremities have no cyanosis or edema, full range of motion is present. Skin: Abscesses present in the right axilla.  1 as spontaneously drained, but is still fluctuant and tender.  Both have overlying erythema. Neurologic: Mental status is normal, cranial nerves are intact, there are no motor or sensory deficits.  ED Results / Procedures / Treatments    Procedures .38Incision and Drainage  Date/Time: 07/17/2020 11:57 PM Performed by: 07/19/2020, MD Authorized by: Dione Booze, MD   Consent:    Consent obtained:  Verbal   Consent given by:  Patient   Risks, benefits, and alternatives were  discussed: yes     Risks discussed:  Bleeding, incomplete drainage and pain   Alternatives discussed:  Alternative treatment Universal protocol:    Procedure explained and questions answered to patient or proxy's satisfaction: yes     Relevant documents present and verified: yes     Required blood products, implants, devices, and special equipment available: yes     Site/side marked: yes     Immediately prior to procedure, a time out was called: yes     Patient identity confirmed:  Verbally with patient and arm band Location:    Type:  Abscess   Size:  6x2 cm   Location:  Upper extremity   Upper extremity location: right axilla. Pre-procedure details:    Skin preparation:  Chlorhexidine Sedation:    Sedation type:  None Anesthesia:    Anesthesia method:  Local infiltration   Local anesthetic:  Lidocaine 2% WITH epi Procedure type:    Complexity:  Complex Procedure details:    Ultrasound guidance: no     Needle aspiration: no     Incision types:  Cruciate (Two incisions)   Incision depth:  Subcutaneous   Wound management:  Probed and deloculated   Drainage amount:  Scant   Wound treatment:  Wound left open   Packing materials:  None Post-procedure details:    Procedure completion:  Tolerated well, no immediate complications     Medications Ordered in ED Medications   lidocaine-EPINEPHrine (XYLOCAINE-EPINEPHrine) 1 %-1:200000 (PF) injection 20 mL (has no administration in time range)  lidocaine-EPINEPHrine (XYLOCAINE W/EPI) 2 %-1:200000 (PF) injection (has no administration in time range)  doxycycline (VIBRA-TABS) tablet 100 mg (has no administration in time range)    ED Course  I have reviewed the triage vital signs and the nursing notes.  MDM Rules/Calculators/A&P                         Right axillary abscesses treated with incision and drainage.  Old records are reviewed confirming hospital admission with incision and drainage of left axillary abscess in 2015.  She will be discharged with a prescription of doxycycline, referred back to her PCP but advised that she may need a more definitive procedure because of tendency for axillary abscesses to recur.  Final Clinical Impression(s) / ED Diagnoses Final diagnoses:  Abscess of right axilla    Rx / DC Orders ED Discharge Orders         Ordered    doxycycline (VIBRAMYCIN) 100 MG capsule  2 times daily        07/17/20 2357           Dione Booze, MD 07/18/20 0003

## 2020-07-17 NOTE — ED Notes (Signed)
I&D supplies in room. Hand-off Xylocaine w/Epi 2%-1:200000 to PA in room.

## 2020-12-01 ENCOUNTER — Encounter (HOSPITAL_COMMUNITY): Payer: Self-pay

## 2020-12-01 ENCOUNTER — Encounter (HOSPITAL_COMMUNITY): Payer: Self-pay | Admitting: Psychiatry

## 2020-12-01 ENCOUNTER — Emergency Department (HOSPITAL_COMMUNITY)
Admission: EM | Admit: 2020-12-01 | Discharge: 2020-12-01 | Disposition: A | Discharge status: Other Acute Inpatient Hospital | Payer: BC Managed Care – PPO | Source: Home / Self Care | Attending: Emergency Medicine | Admitting: Emergency Medicine

## 2020-12-01 ENCOUNTER — Inpatient Hospital Stay (HOSPITAL_COMMUNITY)
Admission: AD | Admit: 2020-12-01 | Discharge: 2020-12-08 | DRG: 885 | Disposition: A | Discharge status: Home / Self Care | Payer: BC Managed Care – PPO | Source: Intra-hospital | Attending: Emergency Medicine | Admitting: Emergency Medicine

## 2020-12-01 ENCOUNTER — Other Ambulatory Visit: Payer: Self-pay

## 2020-12-01 DIAGNOSIS — G4700 Insomnia, unspecified: Secondary | ICD-10-CM | POA: Diagnosis present

## 2020-12-01 DIAGNOSIS — Z23 Encounter for immunization: Secondary | ICD-10-CM

## 2020-12-01 DIAGNOSIS — R45851 Suicidal ideations: Secondary | ICD-10-CM | POA: Diagnosis present

## 2020-12-01 DIAGNOSIS — G40909 Epilepsy, unspecified, not intractable, without status epilepticus: Secondary | ICD-10-CM | POA: Diagnosis present

## 2020-12-01 DIAGNOSIS — S61519A Laceration without foreign body of unspecified wrist, initial encounter: Secondary | ICD-10-CM

## 2020-12-01 DIAGNOSIS — Z9151 Personal history of suicidal behavior: Secondary | ICD-10-CM

## 2020-12-01 DIAGNOSIS — Y900 Blood alcohol level of less than 20 mg/100 ml: Secondary | ICD-10-CM | POA: Insufficient documentation

## 2020-12-01 DIAGNOSIS — Z833 Family history of diabetes mellitus: Secondary | ICD-10-CM | POA: Diagnosis not present

## 2020-12-01 DIAGNOSIS — Z823 Family history of stroke: Secondary | ICD-10-CM | POA: Diagnosis not present

## 2020-12-01 DIAGNOSIS — S61511A Laceration without foreign body of right wrist, initial encounter: Secondary | ICD-10-CM | POA: Insufficient documentation

## 2020-12-01 DIAGNOSIS — Z79899 Other long term (current) drug therapy: Secondary | ICD-10-CM

## 2020-12-01 DIAGNOSIS — Z87891 Personal history of nicotine dependence: Secondary | ICD-10-CM

## 2020-12-01 DIAGNOSIS — F315 Bipolar disorder, current episode depressed, severe, with psychotic features: Principal | ICD-10-CM | POA: Diagnosis present

## 2020-12-01 DIAGNOSIS — F419 Anxiety disorder, unspecified: Secondary | ICD-10-CM | POA: Diagnosis present

## 2020-12-01 DIAGNOSIS — S61512A Laceration without foreign body of left wrist, initial encounter: Secondary | ICD-10-CM | POA: Diagnosis present

## 2020-12-01 DIAGNOSIS — X781XXA Intentional self-harm by knife, initial encounter: Secondary | ICD-10-CM | POA: Diagnosis present

## 2020-12-01 DIAGNOSIS — G2581 Restless legs syndrome: Secondary | ICD-10-CM | POA: Diagnosis present

## 2020-12-01 DIAGNOSIS — Z20822 Contact with and (suspected) exposure to covid-19: Secondary | ICD-10-CM | POA: Insufficient documentation

## 2020-12-01 DIAGNOSIS — F332 Major depressive disorder, recurrent severe without psychotic features: Secondary | ICD-10-CM | POA: Diagnosis not present

## 2020-12-01 DIAGNOSIS — X788XXA Intentional self-harm by other sharp object, initial encounter: Secondary | ICD-10-CM | POA: Insufficient documentation

## 2020-12-01 DIAGNOSIS — K219 Gastro-esophageal reflux disease without esophagitis: Secondary | ICD-10-CM | POA: Diagnosis present

## 2020-12-01 DIAGNOSIS — Z8249 Family history of ischemic heart disease and other diseases of the circulatory system: Secondary | ICD-10-CM | POA: Diagnosis not present

## 2020-12-01 DIAGNOSIS — F411 Generalized anxiety disorder: Secondary | ICD-10-CM | POA: Diagnosis present

## 2020-12-01 LAB — CBC WITH DIFFERENTIAL/PLATELET
Abs Immature Granulocytes: 0.02 10*3/uL (ref 0.00–0.07)
Basophils Absolute: 0 10*3/uL (ref 0.0–0.1)
Basophils Relative: 0 %
Eosinophils Absolute: 0.1 10*3/uL (ref 0.0–0.5)
Eosinophils Relative: 3 %
HCT: 37.2 % (ref 36.0–46.0)
Hemoglobin: 12.3 g/dL (ref 12.0–15.0)
Immature Granulocytes: 0 %
Lymphocytes Relative: 24 %
Lymphs Abs: 1.2 10*3/uL (ref 0.7–4.0)
MCH: 31.6 pg (ref 26.0–34.0)
MCHC: 33.1 g/dL (ref 30.0–36.0)
MCV: 95.6 fL (ref 80.0–100.0)
Monocytes Absolute: 0.3 10*3/uL (ref 0.1–1.0)
Monocytes Relative: 6 %
Neutro Abs: 3.4 10*3/uL (ref 1.7–7.7)
Neutrophils Relative %: 67 %
Platelets: 216 10*3/uL (ref 150–400)
RBC: 3.89 MIL/uL (ref 3.87–5.11)
RDW: 14 % (ref 11.5–15.5)
WBC: 5.1 10*3/uL (ref 4.0–10.5)
nRBC: 0 % (ref 0.0–0.2)

## 2020-12-01 LAB — ACETAMINOPHEN LEVEL: Acetaminophen (Tylenol), Serum: <10 ug/mL — ABNORMAL LOW (ref 10–30)

## 2020-12-01 LAB — I-STAT CHEM 8, ED
BUN: 11 mg/dL (ref 6–20)
Calcium, Ion: 1.2 mmol/L (ref 1.15–1.40)
Chloride: 103 mmol/L (ref 98–111)
Creatinine, Ser: 0.4 mg/dL — ABNORMAL LOW (ref 0.44–1.00)
Glucose, Bld: 93 mg/dL (ref 70–99)
HCT: 39 % (ref 36.0–46.0)
Hemoglobin: 13.3 g/dL (ref 12.0–15.0)
Potassium: 3.4 mmol/L — ABNORMAL LOW (ref 3.5–5.1)
Sodium: 139 mmol/L (ref 135–145)
TCO2: 25 mmol/L (ref 22–32)

## 2020-12-01 LAB — COMPREHENSIVE METABOLIC PANEL
ALT: 15 U/L (ref 0–44)
AST: 16 U/L (ref 15–41)
Albumin: 4.1 g/dL (ref 3.5–5.0)
Alkaline Phosphatase: 49 U/L (ref 38–126)
Anion gap: 7 (ref 5–15)
BUN: 12 mg/dL (ref 6–20)
CO2: 25 mmol/L (ref 22–32)
Calcium: 8.7 mg/dL — ABNORMAL LOW (ref 8.9–10.3)
Chloride: 104 mmol/L (ref 98–111)
Creatinine, Ser: 0.41 mg/dL — ABNORMAL LOW (ref 0.44–1.00)
GFR, Estimated: >60 mL/min (ref >60)
Glucose, Bld: 96 mg/dL (ref 70–99)
Potassium: 3.3 mmol/L — ABNORMAL LOW (ref 3.5–5.1)
Sodium: 136 mmol/L (ref 135–145)
Total Bilirubin: 0.5 mg/dL (ref 0.3–1.2)
Total Protein: 7.2 g/dL (ref 6.5–8.1)

## 2020-12-01 LAB — ETHANOL: Alcohol, Ethyl (B): <10 mg/dL (ref <10)

## 2020-12-01 LAB — RESP PANEL BY RT-PCR (FLU A&B, COVID) ARPGX2
Influenza A by PCR: NEGATIVE
Influenza B by PCR: NEGATIVE
SARS Coronavirus 2 by RT PCR: NEGATIVE

## 2020-12-01 LAB — SALICYLATE LEVEL: Salicylate Lvl: <7 mg/dL — ABNORMAL LOW (ref 7.0–30.0)

## 2020-12-01 MED ORDER — TETANUS-DIPHTH-ACELL PERTUSSIS 5-2.5-18.5 LF-MCG/0.5 IM SUSY
0.5000 mL | PREFILLED_SYRINGE | Freq: Once | INTRAMUSCULAR | Status: AC
  Administered 2020-12-01: 0.5 mL via INTRAMUSCULAR
  Filled 2020-12-01: qty 0.5

## 2020-12-01 MED ORDER — LEVETIRACETAM 250 MG PO TABS
250.0000 mg | ORAL_TABLET | Freq: Two times a day (BID) | ORAL | Status: DC
  Administered 2020-12-01 – 2020-12-08 (×14): 250 mg via ORAL
  Filled 2020-12-01 (×19): qty 1

## 2020-12-01 MED ORDER — MAGNESIUM HYDROXIDE 400 MG/5ML PO SUSP: 30.0000 mL | Freq: Every day | ORAL | Status: DC | PRN

## 2020-12-01 MED ORDER — LOPERAMIDE HCL 2 MG PO CAPS: 2.0000 mg | ORAL_CAPSULE | ORAL | Status: AC | PRN

## 2020-12-01 MED ORDER — ACETAMINOPHEN 325 MG PO TABS
650.0000 mg | ORAL_TABLET | ORAL | Status: DC | PRN
Start: 2020-12-01 — End: 2020-12-01
  Administered 2020-12-01: 650 mg via ORAL
  Filled 2020-12-01: qty 2

## 2020-12-01 MED ORDER — ALUM & MAG HYDROXIDE-SIMETH 200-200-20 MG/5ML PO SUSP
30.0000 mL | ORAL | Status: DC | PRN
Start: 2020-12-01 — End: 2020-12-08

## 2020-12-01 MED ORDER — ONDANSETRON 4 MG PO TBDP: 4.0000 mg | ORAL_TABLET | Freq: Four times a day (QID) | ORAL | Status: AC | PRN

## 2020-12-01 MED ORDER — TEMAZEPAM 15 MG PO CAPS
30.0000 mg | ORAL_CAPSULE | Freq: Every evening | ORAL | Status: DC | PRN
  Administered 2020-12-01: 30 mg via ORAL
  Filled 2020-12-01: qty 2

## 2020-12-01 MED ORDER — GABAPENTIN 300 MG PO CAPS
300.0000 mg | ORAL_CAPSULE | Freq: Three times a day (TID) | ORAL | Status: DC
  Administered 2020-12-01 – 2020-12-08 (×21): 300 mg via ORAL
  Filled 2020-12-01 (×28): qty 1

## 2020-12-01 MED ORDER — LORAZEPAM 1 MG PO TABS
1.0000 mg | ORAL_TABLET | Freq: Four times a day (QID) | ORAL | Status: AC | PRN
  Administered 2020-12-01: 1 mg via ORAL
  Filled 2020-12-01: qty 1

## 2020-12-01 MED ORDER — GABAPENTIN 300 MG PO CAPS
300.0000 mg | ORAL_CAPSULE | Freq: Three times a day (TID) | ORAL | Status: DC
  Administered 2020-12-01: 300 mg via ORAL
  Filled 2020-12-01: qty 1

## 2020-12-01 MED ORDER — POVIDONE-IODINE 5 % EX SOLN
Freq: Once | CUTANEOUS | Status: AC
  Administered 2020-12-01: 1 via TOPICAL
  Filled 2020-12-01: qty 88.7

## 2020-12-01 MED ORDER — NICOTINE 21 MG/24HR TD PT24
21.0000 mg | MEDICATED_PATCH | Freq: Every day | TRANSDERMAL | Status: DC
  Administered 2020-12-01 – 2020-12-08 (×8): 21 mg via TRANSDERMAL
  Filled 2020-12-01 (×11): qty 1

## 2020-12-01 MED ORDER — ALPRAZOLAM 0.5 MG PO TABS
0.5000 mg | ORAL_TABLET | Freq: Three times a day (TID) | ORAL | Status: DC | PRN
  Administered 2020-12-01: 0.5 mg via ORAL
  Filled 2020-12-01: qty 1

## 2020-12-01 MED ORDER — LEVETIRACETAM 500 MG PO TABS
500.0000 mg | ORAL_TABLET | Freq: Every day | ORAL | Status: DC
  Administered 2020-12-01: 500 mg via ORAL
  Filled 2020-12-01: qty 1

## 2020-12-01 MED ORDER — POVIDONE-IODINE 10 % EX SOLN
CUTANEOUS | Status: AC
  Administered 2020-12-01: 1
  Filled 2020-12-01: qty 15

## 2020-12-01 MED ORDER — ACETAMINOPHEN 325 MG PO TABS
650.0000 mg | ORAL_TABLET | Freq: Four times a day (QID) | ORAL | Status: DC | PRN
  Administered 2020-12-02 – 2020-12-08 (×13): 650 mg via ORAL
  Filled 2020-12-01 (×12): qty 2

## 2020-12-01 MED ORDER — LIDOCAINE-EPINEPHRINE (PF) 2 %-1:200000 IJ SOLN
20.0000 mL | Freq: Once | INTRAMUSCULAR | Status: AC
  Administered 2020-12-01: 20 mL
  Filled 2020-12-01: qty 20
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
## 2020-12-01 NOTE — ED Notes (Signed)
Pt in family room for [facility] consult

## 2020-12-01 NOTE — ED Notes (Signed)
Pt taken out by staff to safe transport van.

## 2020-12-01 NOTE — [facility] Suicide Risk Assessment (Addendum)
[facility] Admission Suicide Risk Assessment   Nursing information obtained from:  Patient Demographic factors:  Caucasian, Unemployed Current Mental Status:  suicide attempt prior to admission Loss Factors: Marriage conflict Historical Factors:  Prior suicide attempts Risk Reduction Factors:  Sense of responsibility to family, Positive social support, Living with another person, especially a relative, has children  Total Time Spent in Direct Patient Care:  I personally spent 40 minutes on the unit in direct patient care. The direct patient care time included face-to-face time with the patient, reviewing the patient's chart, communicating with other professionals, and coordinating care. Greater than 50% of this time was spent in counseling or coordinating care with the patient regarding goals of hospitalization, psycho-education, and discharge planning needs.  Principal Problem: Bipolar affective disorder, depressed, severe, with psychotic behavior (HCC) Diagnosis:  Principal Problem:   Bipolar affective disorder, depressed, severe, with psychotic behavior (HCC)  Subjective Data: The patient is a 30y/o female with past psychiatric history significant for bipolar d/o, who was see at [facility] ED on 12/02/31 after cutting both wrists in a suicide attempt. She was medically stabilized and transferred from [facility] under IVC for psychiatric treatment.   The patient states that she was diagnosed years ago with bipolar d/o and was previously followed by Dr. Lolly Mustache as an outpatient. Presently she states her PCP has been managing her medications and she has been taking Restoril 60mg  qhs and Xanax 1mg  tid. She thinks she has previously tried Wellbutrin, Lamictal, Lexapro and Cymbalta, and according to her notes, she has also previously tried Prozac,  Seroquel, Risperdal, Haldol, Vistaril, VPA, Cogentin, and Trazodone. She states that she has been feeling depressed consistently for several months leading up to her  suicide attempt and that when she discovered that her husband was cheating on her with someone from his job this "was the straw that broke the camel's back." She admits to having residual passive SI without intent or plan at this and can contract for safety. She denies HI. She denies drug or alcohol abuse history. She reports neurovegetative symptoms of depression that include insomnia, anhedonia, low energy, poor focus, and poor appetite. She states she has not had a manic or hypomanic episode in almost 4 years but states that when manic she has decreased need for sleep, burst of energy where she wants to clean and multi-task, talks in excess, has racing thoughts, and cannot focus due to ease of distraction. She is unclear how long these previous manic/hypomanic episodes have lasted and according to her notes she was previously diagnosed with bipolar d/o NOS. She states that for years she has felt she could "picture how things will be or predict the future" but has not told anyone because she was afraid "this would make me sound crazy." She states that last night she had paranoia and belief that she was hearing people who were not in her room and thought her door was being opened and closed between room checks when no one was present. She did not endorse h/o ideas of reference or first rank symptoms. She states she has a h/o epilepsy and is presently managed by her PCP with Keppra for seizures. She reports her last seizure was several months ago and she does not have a current neurologist. See H&P for additional details.  Continued Clinical Symptoms:  Alcohol Use Disorder Identification Test Final Score (AUDIT): 0 The "Alcohol Use Disorders Identification Test", Guidelines for Use in Primary Care, Second Edition.  World [facility]). Score between  0-7:  no or low risk or alcohol related problems. Score between 8-15:  moderate risk of alcohol related problems. Score between 16-19:  high risk of  alcohol related problems. Score 20 or above:  warrants further diagnostic evaluation for alcohol dependence and treatment.  CLINICAL FACTORS:   Bipolar Disorder:   Depressive phase Previous Psychiatric Diagnoses and Treatments  Musculoskeletal: Strength & Muscle Tone: within normal limits Gait & Station: normal Patient leans: N/A Psychiatric Specialty Exam: Physical Exam Vitals reviewed.  HENT:     Head: Normocephalic.  Pulmonary:     Effort: Pulmonary effort is normal.  Neurological:     General: No focal deficit present.     Mental Status: She is alert.    Review of Systems - see H&P  Blood pressure 105/63, pulse 87, temperature 98.2 F (36.8 C), temperature source Oral, resp. rate 16, height 5\' 6"  (1.676 m), weight 54.4 kg, SpO2 99 %, currently breastfeeding.Body mass index is 19.37 kg/m.  General Appearance:  casually dressed in scrubs, fair hygiene, appears stated age  Eye Contact:  Fair  Speech:  Clear and Coherent and Normal Rate  Volume:  Normal  Mood:  Anxious and Depressed  Affect:  Constricted  Thought Process:  Goal Directed and Linear  Orientation:  Full (Time, Place, and Person)  Thought Content:   Admits to AVH last night of people entering her room with associated paranoia; has delusional belief she can predict the future; is not grossly responding to internal/external stimuli on exam  Suicidal Thoughts:  Suicide attempt prior to admission and has residual passive SI without intent or plan today and can contract for safety on the unit  Homicidal Thoughts:  No  Memory:  Recent;   Good  Judgement:  Fair  Insight:  Present  Psychomotor Activity:  Normal  Concentration:  Concentration: Good and Attention Span: Good  Recall:  Good  Fund of Knowledge:  Good  Language:  Good  Akathisia:  Negative  Assets:  Communication Skills Desire for Improvement Housing Resilience Social Support  ADL's:  Intact  Cognition:  WNL  Sleep:  Number of Hours: 4    COGNITIVE FEATURES THAT CONTRIBUTE TO RISK:  Thought constriction (tunnel vision)    SUICIDE RISK:   Moderate:  Frequent suicidal ideation with limited intensity, and duration, some specificity in terms of plans, no associated intent at this time but with recent attempt,  limited dysphoria/symptomatology, some risk factors present, and identifiable protective factors, including available and accessible social support.  PLAN OF CARE: Patient admitted voluntarily to [facility]. Admission labs reviewed: BMP WNL except for K+ 3.4 and creatinine 0.40; repeat BMP today shows K+ 3.7 and remainder of panel WNL except for Ca+ 8.8; CMP WNL except for K+ 3.3, creatinine 0.41, Ca+ 8.7; respiratory panel negative; Salicylate <7, Tylenol <10, ETOH <10, CBC WNL; Lipid panel WNL; UPT negative; UDS positive for benzodiazepines; A1c 4.8;TSH 0.637. EKG pending.   Patient agrees to taper down on her Restoril. Her dose was reduced on admission to 30mg  qhs and will be reduced further to 15mg  qhs tonight with plans to taper off during admission. She has discussed with NP and wishes to taper off Xanax and agrees to dose reduction to 0.5mg  tid today. Will continue CIWA protocol with Ativan 1mg  for score >10 with dose reduction. Patient's home seizure medications will be verified and restarted and she will be placed on seizure precautions. She discussed with NP and has agreed to trial of Seroquel to help with paranoia,  AVH, mood stabilization, anxiety, and sleep. I discussed the r/b/se/a of this medication including risk of developing TD/EPS, weight gain, sedation, DM and high cholesterol and she verbalized understanding of risks. Her allergy list reported Seroquel allergy but she adamantly denies this as a previous drug allergy. NP has also discussed start of Buspar to help with anxiety as she tapers down on benzodiazepines. She states he TD booster was updated in the ED and we will proceed with wound care for lacerations. SW to assist  with discharge planning. PDMP reviewed and home doses of Xanax and Restoril verified.   I certify that inpatient services furnished can reasonably be expected to improve the patient's condition.   Comer Locket, MD, FAPA 12/02/2020, 12:04 PM

## 2020-12-01 NOTE — ED Provider Notes (Signed)
[facility] EMERGENCY DEPARTMENT Provider Note   CSN: [id number] Arrival date & time: 12/01/20  4098     History Chief Complaint  Patient presents with   Suicide Attempt    Tonya Harmon is a 30 y.o. female.  Patient with a history of seizure disorder, bipolar disorder presenting with self-inflicted wrist lacerations.  States she was able razor blade to her bilateral wrists about 2 or 3 AM.  She was trying to hurt herself.  Now she just wants to sleep.  She is anxious and tearful.  Denies wanting hurt anyone else.  States compliance with her medications.  Denies any alcohol or drug use. Denies any weakness to her fingers or wrist.  No numbness or tingling.  Bleeding is controlled.  Tetanus needs updating  The history is provided by the patient.      Past Medical History:  Diagnosis Date   Anxiety    Back pain    Bipolar 1 disorder (HCC)    Bipolar disorder (HCC)    Common migraine with intractable migraine 06/08/2014   Contraceptive management 10/05/2015   Depression    Epilepsy (HCC)    GERD (gastroesophageal reflux disease)    pregnancy related-rolaids prn   Headache(784.0)    Insomnia    Nexplanon in place 10/04/2015   Pelvic pain in female 10/04/2015   PONV (postoperative nausea and vomiting)    Sciatica    Seizures (HCC)     Patient Active Problem List   Diagnosis Date Noted   Encounter for sterilization 08/02/2016   Substance abuse (HCC) 03/14/2016   Asymptomatic bacteriuria during pregnancy in first trimester 02/07/2016   History of cesarean section 01/31/2016   Seizures (HCC) 06/08/2014   Common migraine with intractable migraine 06/08/2014   Axillary mass 09/07/2013   Cellulitis of axilla, left 09/07/2013   Anxiety 05/06/2012   Depression 05/06/2012    Past Surgical History:  Procedure Laterality Date   CESAREAN SECTION  2011,2008   CESAREAN SECTION N/A 05/18/2012   Procedure: CESAREAN SECTION;  Surgeon: Tilda Burrow, MD;  Location: [facility]  ORS;  Service: Obstetrics;  Laterality: N/A;   CESAREAN SECTION WITH BILATERAL TUBAL LIGATION Bilateral 08/02/2016   Procedure: CESAREAN SECTION WITH BILATERAL TUBAL LIGATION;  Surgeon: Tilda Burrow, MD;  Location: [facility] BIRTHING SUITES;  Service: Obstetrics;  Laterality: Bilateral;   INCISION AND DRAINAGE ABSCESS Left 09/10/2013   Procedure: INCISION AND DRAINAGE ABSCESS, left breast/axilla;  Surgeon: Dalia Heading, MD;  Location: [facility] ORS;  Service: General;  Laterality: Left;   TUBAL LIGATION       OB History     Gravida  4   Para  4   Term  4   Preterm      AB      Living  3      SAB      IAB      Ectopic      Multiple  0   Live Births  3           Family History  Problem Relation Age of Onset   Diabetes Mother    Fibromyalgia Mother    Rheum arthritis Mother    Diabetes Other    Hypertension Other    Cancer Other    Coronary artery disease Other    Tremor Maternal Grandmother    Cancer Maternal Grandmother        breast, uterine   Alzheimer's disease Maternal Grandmother    Stroke Maternal Grandmother  Coronary artery disease Maternal Grandfather    Diabetes Maternal Grandfather    Hypertension Maternal Grandfather    Diabetes Paternal Grandmother    Hypertension Paternal Grandmother    Cancer Paternal Grandmother        breast   Cancer Paternal Grandfather        pancreatic   Hypertension Paternal Grandfather    Seizures Neg Hx     Social History   Tobacco Use   Smoking status: Former    Types: Cigarettes   Smokeless tobacco: Never  Vaping Use   Vaping Use: Never used  Substance Use Topics   Alcohol use: No   Drug use: No    Home Medications Prior to Admission medications   Medication Sig Start Date End Date Taking? Authorizing Provider  acetaminophen (TYLENOL) 500 MG tablet Take 500 mg by mouth every 6 (six) hours as needed.    [provider]  docusate sodium (COLACE) 100 MG capsule Take 1 capsule (100 mg total) by  mouth 2 (two) times daily as needed. 08/04/16   Constant, Peggy, MD  doxycycline (VIBRAMYCIN) 100 MG capsule Take 1 capsule (100 mg total) by mouth 2 (two) times daily. 07/17/20   Dione Booze, MD  gabapentin (NEURONTIN) 300 MG capsule Take 1 capsule (300 mg total) by mouth 3 (three) times daily. 12/30/16   Cheral Marker, CNM  HYDROcodone-acetaminophen (NORCO/VICODIN) 5-325 MG tablet Take 1 tablet by mouth every 6 (six) hours as needed. Patient not taking: Reported on 10/31/2016 08/09/16   Lazaro Arms, MD  ibuprofen (ADVIL,MOTRIN) 200 MG tablet Take 200 mg by mouth every 6 (six) hours as needed.    [provider]  ibuprofen (ADVIL,MOTRIN) 600 MG tablet Take 1 tablet (600 mg total) by mouth 4 (four) times daily. 08/04/16   Constant, Peggy, MD  levETIRAcetam (KEPPRA) 500 MG tablet TAKE 1 TABLET BY MOUTH ONCE DAILY FOR 7 DAYS, THEN TAKE 1 TABLET TWICE DAILY 12/18/16   Cheral Marker, CNM  Prenat w/o A-FeCbGl-DSS-FA-DHA (CITRANATAL ASSURE) 35-1 & 300 MG tablet One tablet and one capsule daily Patient not taking: Reported on 10/31/2016 03/13/16   Cheral Marker, CNM    Allergies    Clonazepam, Tramadol, Flexeril [cyclobenzaprine hcl], Naproxen, Prednisone, Seroquel [quetiapine], Trazodone and nefazodone, Cefixime, and Vistaril [hydroxyzine hcl]  Review of Systems   Review of Systems  Constitutional:  Negative for activity change, appetite change and fever.  HENT:  Negative for congestion and rhinorrhea.   Respiratory:  Negative for cough, chest tightness and shortness of breath.   Cardiovascular:  Negative for chest pain.  Gastrointestinal:  Negative for abdominal pain, nausea and vomiting.  Genitourinary:  Negative for dysuria and hematuria.  Neurological:  Negative for dizziness, weakness and headaches.  Psychiatric/Behavioral:  Positive for decreased concentration, dysphoric mood, self-injury, sleep disturbance and suicidal ideas.    all other systems are negative except as  noted in the HPI and PMH.   Physical Exam Updated Vital Signs BP 125/87 (BP Location: Left Arm)   Pulse 81   Temp 98.6 F (37 C) (Oral)   Resp 16   SpO2 100%   Physical Exam Vitals and nursing note reviewed.  Constitutional:      General: She is not in acute distress.    Appearance: She is well-developed.     Comments: Tearful and anxious  HENT:     Head: Normocephalic and atraumatic.     Mouth/Throat:     Pharynx: No oropharyngeal exudate.  Eyes:  Conjunctiva/sclera: Conjunctivae normal.     Pupils: Pupils are equal, round, and reactive to light.  Neck:     Comments: No meningismus. Cardiovascular:     Rate and Rhythm: Normal rate and regular rhythm.     Heart sounds: Normal heart sounds. No murmur heard. Pulmonary:     Effort: Pulmonary effort is normal. No respiratory distress.     Breath sounds: Normal breath sounds.  Abdominal:     Palpations: Abdomen is soft.     Tenderness: There is no abdominal tenderness. There is no guarding or rebound.  Musculoskeletal:        General: No tenderness. Normal range of motion.     Cervical back: Normal range of motion and neck supple.     Comments: Hemostatic bilateral wrist lacerations to the volar surface approximately 4 cm.  One laceration to right wrist and one to the left wrist in a longitudinal direction.  Wrist flexion and extension intact, cardinal hand movements intact bilaterally.  Skin:    General: Skin is warm.  Neurological:     Mental Status: She is alert and oriented to person, place, and time.     Cranial Nerves: No cranial nerve deficit.     Motor: No abnormal muscle tone.     Coordination: Coordination normal.     Comments:  5/5 strength throughout. CN 2-12 intact.Equal grip strength.   Psychiatric:        Behavior: Behavior normal.       ED Results / Procedures / Treatments   Labs (all labs ordered are listed, but only abnormal results are displayed) Labs Reviewed  COMPREHENSIVE METABOLIC PANEL  - Abnormal; Notable for the following components:      Result Value   Potassium 3.3 (*)    Creatinine, Ser 0.41 (*)    Calcium 8.7 (*)    All other components within normal limits  ACETAMINOPHEN LEVEL - Abnormal; Notable for the following components:   Acetaminophen (Tylenol), Serum <10 (*)    All other components within normal limits  SALICYLATE LEVEL - Abnormal; Notable for the following components:   Salicylate Lvl <7.0 (*)    All other components within normal limits  I-STAT CHEM 8, ED - Abnormal; Notable for the following components:   Potassium 3.4 (*)    Creatinine, Ser 0.40 (*)    All other components within normal limits  CBC WITH DIFFERENTIAL/PLATELET  ETHANOL  URINALYSIS, ROUTINE W REFLEX MICROSCOPIC  RAPID URINE DRUG SCREEN, HOSP PERFORMED  I-STAT BETA HCG BLOOD, ED ([facility], [facility], [facility] ONLY)    EKG None  Radiology No results found.  Procedures .Marland KitchenLaceration Repair  Date/Time: 12/01/2020 6:32 AM Performed by: Glynn Octave, MD Authorized by: Glynn Octave, MD   Consent:    Consent obtained:  Verbal   Consent given by:  Patient   Risks, benefits, and alternatives were discussed: yes     Risks discussed:  Infection, nerve damage, retained foreign body, pain, poor cosmetic result, poor wound healing, vascular damage, tendon damage and need for additional repair Universal protocol:    Procedure explained and questions answered to patient or proxy's satisfaction: yes     Immediately prior to procedure, a time out was called: yes     Patient identity confirmed:  Verbally with patient Anesthesia:    Anesthesia method:  Local infiltration   Local anesthetic:  Lidocaine 2% WITH epi Laceration details:    Location:  Hand   Hand location:  L wrist   Length (cm):  4 Pre-procedure details:    Preparation:  Patient was prepped and draped in usual sterile fashion and imaging obtained to evaluate for foreign bodies Exploration:    Hemostasis achieved with:  Epinephrine  and direct pressure   Imaging outcome: foreign body not noted     Wound exploration: wound explored through full range of motion and entire depth of wound visualized     Wound extent: areolar tissue violated     Wound extent: no nerve damage noted, no tendon damage noted and no vascular damage noted   Treatment:    Area cleansed with:  Povidone-iodine   Amount of cleaning:  Standard   Irrigation solution:  Sterile saline   Irrigation volume:  1000   Irrigation method:  Pressure wash   Visualized foreign bodies/material removed: no     Scar revision: no   Skin repair:    Repair method:  Staples   Number of staples:  6 Approximation:    Approximation:  Close Repair type:    Repair type:  Simple Post-procedure details:    Dressing:  Adhesive bandage   Procedure completion:  Tolerated .Marland KitchenLaceration Repair  Date/Time: 12/01/2020 6:35 AM Performed by: Glynn Octave, MD Authorized by: Glynn Octave, MD   Consent:    Consent obtained:  Verbal   Consent given by:  Patient   Risks, benefits, and alternatives were discussed: yes     Risks discussed:  Infection, need for additional repair, nerve damage, poor wound healing, poor cosmetic result, pain, retained foreign body, vascular damage and tendon damage   Alternatives discussed:  No treatment Universal protocol:    Procedure explained and questions answered to patient or proxy's satisfaction: yes     Immediately prior to procedure, a time out was called: yes     Patient identity confirmed:  Verbally with patient Anesthesia:    Anesthesia method:  Local infiltration   Local anesthetic:  Lidocaine 2% WITH epi Laceration details:    Location:  Hand   Hand location:  R wrist   Length (cm):  6 Pre-procedure details:    Preparation:  Patient was prepped and draped in usual sterile fashion and imaging obtained to evaluate for foreign bodies Exploration:    Hemostasis achieved with:  Epinephrine and direct pressure   Imaging  outcome: foreign body not noted     Wound exploration: wound explored through full range of motion and entire depth of wound visualized     Wound extent: no nerve damage noted, no tendon damage noted and no vascular damage noted     Contaminated: no   Treatment:    Area cleansed with:  Povidone-iodine   Amount of cleaning:  Standard   Irrigation solution:  Sterile saline   Irrigation volume:  1000   Irrigation method:  Pressure wash   Debridement:  None   Undermining:  None Skin repair:    Repair method:  Staples   Number of staples:  5 Approximation:    Approximation:  Close Repair type:    Repair type:  Simple Post-procedure details:    Dressing:  Antibiotic ointment and adhesive bandage   Procedure completion:  Tolerated   Medications Ordered in ED Medications  lidocaine-EPINEPHrine (XYLOCAINE W/EPI) 2 %-1:200000 (PF) injection 20 mL (has no administration in time range)  povidone-Iodine (BETADINE) 5 % topical solution (has no administration in time range)  Tdap (BOOSTRIX) injection 0.5 mL (has no administration in time range)    ED Course  I have reviewed the triage vital  signs and the nursing notes.  Pertinent labs & imaging results that were available during my care of the patient were reviewed by me and considered in my medical decision making (see chart for details).    MDM Rules/Calculators/A&P                           Self-inflicted wrist lacerations.  Vitals are stable.  Lacerations are hemostatic.  No evidence of underlying tendon or nerve injury  Lacerations repaired as above.  Discussed with patient that she will have a scar no matter what method of repair is used. Tetanus updated.  Screening labs obtained and unremarkable.   Patient medically clear for TTS evaluation. Holding orders placed.  Remains voluntary at this time.  Will need IVC if she tries to leave.  Final Clinical Impression(s) / ED Diagnoses Final diagnoses:  None    Rx / DC  Orders ED Discharge Orders     None        Shaylie Eklund, Jeannett Senior, MD 12/01/20 [phone]
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
## 2020-12-01 NOTE — Progress Notes (Signed)
Pt accepted to [facility] 403-1    Patient meets inpatient criteria per Doran Heater, NP   Dr.Singleton is the attending provider.    Call report to [phone]    Georgann Housekeeper, RN @ [facility] notified.     Pt scheduled  to arrive at [facility] TODAY. The Pt.'s bed is available now.   Damita Dunnings, MSW, LCSW-A  1:24 PM 12/01/2020
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
## 2020-12-01 NOTE — BHH Counselor (Signed)
TTS called to conducted tele-assessment, Zifer, RN, requested TTS check back at a later time. "Yes but can you check back in just a few, Im trying to get her cleaned up and medicated for her pain!  TTS will attempt to assess patient later this date

## 2020-12-01 NOTE — ED Triage Notes (Signed)
Pt arrived via [facility] with vertical lacerations to bilateral wrists. Pt stated that at the time she wanted to die, now she just wants to sleep. Pt is notably sad et crying at present.

## 2020-12-01 NOTE — Tx Team (Signed)
Initial Treatment Plan 12/01/2020 5:38 PM Verona Hartshorn Love-McDowell LPN:[id number]    PATIENT STRESSORS: Marital or family conflict     PATIENT STRENGTHS: Ability for insight  Communication skills  Motivation for treatment/growth  Supportive family/friends    PATIENT IDENTIFIED PROBLEMS: Depression, Marriage Conflict                     DISCHARGE CRITERIA:  Ability to meet basic life and health needs Improved stabilization in mood, thinking, and/or behavior Motivation to continue treatment in a less acute level of care Reduction of life-threatening or endangering symptoms to within safe limits Verbal commitment to aftercare and medication compliance  PRELIMINARY DISCHARGE PLAN: Attend aftercare/continuing care group Participate in family therapy  PATIENT/FAMILY INVOLVEMENT: This treatment plan has been presented to and reviewed with the patient, Tonya Harmon, and/or family member. The patient and family have been given the opportunity to ask questions and make suggestions.  Ginger Carne, RN 12/01/2020, 5:38 PM

## 2020-12-01 NOTE — ED Notes (Signed)
TTS assessing patient at this time.  

## 2020-12-02 DIAGNOSIS — F332 Major depressive disorder, recurrent severe without psychotic features: Secondary | ICD-10-CM | POA: Diagnosis not present

## 2020-12-02 DIAGNOSIS — F315 Bipolar disorder, current episode depressed, severe, with psychotic features: Secondary | ICD-10-CM | POA: Diagnosis present

## 2020-12-02 LAB — BASIC METABOLIC PANEL
Anion gap: 8 (ref 5–15)
BUN: 16 mg/dL (ref 6–20)
CO2: 25 mmol/L (ref 22–32)
Calcium: 8.8 mg/dL — ABNORMAL LOW (ref 8.9–10.3)
Chloride: 104 mmol/L (ref 98–111)
Creatinine, Ser: 0.52 mg/dL (ref 0.44–1.00)
GFR, Estimated: >60 mL/min (ref >60)
Glucose, Bld: 85 mg/dL (ref 70–99)
Potassium: 3.7 mmol/L (ref 3.5–5.1)
Sodium: 137 mmol/L (ref 135–145)

## 2020-12-02 LAB — LIPID PANEL
Cholesterol: 135 mg/dL (ref 0–200)
HDL: 55 mg/dL (ref >40)
LDL Cholesterol: 69 mg/dL (ref 0–99)
Total CHOL/HDL Ratio: 2.5 RATIO
Triglycerides: 57 mg/dL (ref <150)
VLDL: 11 mg/dL (ref 0–40)

## 2020-12-02 LAB — HEMOGLOBIN A1C
Hgb A1c MFr Bld: 4.8 % (ref 4.8–5.6)
Mean Plasma Glucose: 91.06 mg/dL

## 2020-12-02 LAB — RAPID URINE DRUG SCREEN, HOSP PERFORMED
Amphetamines: NOT DETECTED
Barbiturates: NOT DETECTED
Benzodiazepines: POSITIVE — AB
Cocaine: NOT DETECTED
Opiates: NOT DETECTED
Tetrahydrocannabinol: NOT DETECTED

## 2020-12-02 LAB — TSH: TSH: 0.637 u[IU]/mL (ref 0.350–4.500)

## 2020-12-02 LAB — PREGNANCY, URINE: Preg Test, Ur: NEGATIVE

## 2020-12-02 MED ORDER — BUSPIRONE HCL 5 MG PO TABS
5.0000 mg | ORAL_TABLET | Freq: Two times a day (BID) | ORAL | Status: DC
  Administered 2020-12-02 – 2020-12-08 (×12): 5 mg via ORAL
  Filled 2020-12-02 (×16): qty 1

## 2020-12-02 MED ORDER — ALPRAZOLAM 0.5 MG PO TABS
0.5000 mg | ORAL_TABLET | Freq: Three times a day (TID) | ORAL | Status: DC
  Administered 2020-12-02 – 2020-12-03 (×4): 0.5 mg via ORAL
  Filled 2020-12-02 (×4): qty 1

## 2020-12-02 MED ORDER — TEMAZEPAM 15 MG PO CAPS
15.0000 mg | ORAL_CAPSULE | Freq: Every evening | ORAL | Status: DC | PRN
  Administered 2020-12-02 – 2020-12-03 (×2): 15 mg via ORAL
  Filled 2020-12-02 (×2): qty 1

## 2020-12-02 MED ORDER — BACITRACIN-NEOMYCIN-POLYMYXIN OINTMENT TUBE
TOPICAL_OINTMENT | Freq: Two times a day (BID) | CUTANEOUS | Status: DC
  Administered 2020-12-03 – 2020-12-06 (×2): 1 via TOPICAL
  Administered 2020-12-07 (×2): 2 via TOPICAL
  Filled 2020-12-02 (×2): qty 14.17

## 2020-12-02 MED ORDER — QUETIAPINE FUMARATE 50 MG PO TABS
50.0000 mg | ORAL_TABLET | Freq: Every day | ORAL | Status: DC
  Administered 2020-12-02: 50 mg via ORAL
  Filled 2020-12-02 (×3): qty 1

## 2020-12-02 NOTE — Progress Notes (Signed)
Pt did not attend orientation/goal setting group. 

## 2020-12-02 NOTE — Progress Notes (Signed)
Adult Psychoeducational Group Note  Date:  12/02/2020 Time:  9:35 PM  Group Topic/Focus:  Wrap-Up Group:   The focus of this group is to help patients review their daily goal of treatment and discuss progress on daily workbooks.  Participation Level:  Active  Participation Quality:  Appropriate  Affect:  Appropriate  Cognitive:  Appropriate  Insight: Appropriate  Engagement in Group:  Developing/Improving  Modes of Intervention:  Discussion  Additional Comments:  Pt stated her goal for today was to focus on her treatment plan. Pt stated she accomplished her goal today. Pt stated she talked with her doctor and her social worker about her care today. Pt rated her overall day a 7 out of 10. Pt stated she was able to contact her Parents, sister, and husband today which improved her overall day. Pt stated she felt better about herself tonight. Pt stated she was able to attend all groups held today. Pt stated she was able to attend all meals. Pt stated she took all medications provided today. Pt stated her appetite was pretty good today. Pt rated her sleep last night was fair. Pt stated the goal tonight was to get some rest. Pt stated she had some physical pain tonight. Pt stated she had some severe pain in both arms. Pt rated the severe pain in both her left and right arms and 8 on the pain level scale. Pt admitted to dealing with some visual hallucinations and auditory issues tonight. Pt nurse was updated on situation. Pt denies thoughts of harming herself or others. Pt stated she would alert staff if anything changed.  Felipa Furnace 12/02/2020, 9:35 PM

## 2020-12-02 NOTE — Progress Notes (Signed)
Pt did not attend psycho-ed group. 

## 2020-12-02 NOTE — Group Note (Deleted)
LCSW Group Therapy Note  Group Date: 12/02/2020 Start Time: 1015 End Time: 1115   Type of Therapy and Topic:  Group Therapy: Anger Cues and Responses  Participation Level:  {BHH PARTICIPATION LEVEL:22264}   Description of Group:   In this group, patients learned how to recognize the physical, cognitive, emotional, and behavioral responses they have to anger-provoking situations.  They identified a recent time they became angry and how they reacted.  They analyzed how their reaction was possibly beneficial and how it was possibly unhelpful.  The group discussed a variety of healthier coping skills that could help with such a situation in the future.  Focus was placed on how helpful it is to recognize the underlying emotions to our anger, because working on those can lead to a more permanent solution as well as our ability to focus on the important rather than the urgent.  Therapeutic Goals: 1. Patients will remember their last incident of anger and how they felt emotionally and physically, what their thoughts were at the time, and how they behaved. 2. Patients will identify how their behavior at that time worked for them, as well as how it worked against them. 3. Patients will explore possible new behaviors to use in future anger situations. 4. Patients will learn that anger itself is normal and cannot be eliminated, and that healthier reactions can assist with resolving conflict rather than worsening situations.  Summary of Patient Progress:  *** was active during the group. ***he shared a recent occurrence wherein feeling *** led to anger. ***he demonstrated *** insight into the subject matter, was respectful of peers, and participated throughout the entire session.  Therapeutic Modalities:   Cognitive Behavioral Therapy    Contessa Preuss J Grossman-Orr, LCSWA 12/02/2020  12:14 PM    

## 2020-12-02 NOTE — Group Note (Signed)
LCSW Group Therapy Note 12/02/2020  10:15am-11:15am  Type of Therapy and Topic:  Group Therapy: Anger and Commonalities  Participation Level:  Active   Description of Group: In this group, patients initially identified something that consistently makes them angry.  In doing this, they were able to see they have much in common with other patients.  We discussed possible underlying emotions that lead to this anger, which was explained to be the secondary emotion.  We also discussed cognitions that might lead correctly or incorrectly to underlying feelings that then lead to anger.  Many examples were shared by group members.  Commonalities among group members were pointed out throughout the entirety of group.  Also pointed out numerous times is that the targets of a person's anger also have underlying cognitions and emotions that could explain their reactions/anger.  Therapeutic Goals: Patients will be able to identify one thing that consistently angers them and why. Patients will understand the commonalities they have with others in terms of their anger triggers.   Patients will learn that anger itself is a secondary emotion and will think about possible primary emotions that could influence their anger. Patients will learn that cognitions can be correct or incorrect and often lead to the emotions that then lead to anger.     Summary of Patient Progress:  The patient shared that one of his/her causes of anger is "dishonesty" or "someone lying to me."  She was quite interactive throughout group and was willing to talk about her underlying emotions and thoughts when such incident occur.  Her affect started out very flat and mood was depressed, but this changed for the positive in the course of group.  Therapeutic Modalities:   Cognitive Behavioral Therapy  Lynnell Chad, LCSW

## 2020-12-02 NOTE — H&P (Addendum)
sinsing Psychiatric Admission Assessment Adult  Patient Identification: Tonya Harmon MRN:  [id number] Date of Evaluation:  12/02/2020 Chief Complaint:  MDD (major depressive disorder), recurrent episode, severe (HCC) [F33.2] Principal Diagnosis: Bipolar affective disorder, depressed, severe, with psychotic behavior (HCC) Diagnosis:  Principal Problem:   Bipolar affective disorder, depressed, severe, with psychotic behavior (HCC)  History of Present Illness: Report received, records reviewed and care plan discussed with members of out interdisciplinary team.  Patient is a 30 years old caucasian female who was admitted to the unit yesterday evening from [facility] ER for suicide attempt by cutting both wrist and required staples.  She has a hx of Bipolar disorder, Severe depression and anxiety.   She also suffers from Seizure disorder. Throughout the encounter  this morning patient was tearful and admitted that she cut her wrist in other to bleed out and let her husband have his way.  Her stressor for attempting suicide is finding out that her husband is cheating on her with a staff at his office.  This is not the first time her husband cheated on her.  First time her marriage was only 32 years old and she stated she tolerated it while that ended.  This time around she felts he wasted her 14 years in a marriage and raising 4 children and yet her husband decided again to repeat the same cheating.  This morning she is not suicidal and is willing to get help.  She has had multiple previous suicide attempt and all are by OD or cutting or both cutting and OD on pills.  She has no outpatient psychiatrist but her PCP prescribed her Mental health medications and manages her mental health issues.  She feels hopeless, helpless and very depressed and sad.  He husband found her in the bathroom and called emergency 911 who brought her to the hospital.  She reported poor sleep of 2-3 hours despite taking 60 mg  Restoril and 1 mg Alprazolam.  For anxiety she takes 0.5 mg Alprazolam three times  a day until her PCP realized the danger of addiction cut down recently to 0.5 mg po three times a day.  Patient has tried the following medications in the past and had allergy or did not do well on them-Bupropion, Escitalopram, Cymbalta and Lamictal.  She was quickly taken off Lamictal due to Seizure disorder.  We discussed starting Quetiapine and patient is happy because a friend of hers mentioned to her that she takes Quetiapine for Bipolar Mood stabilization.  However, Quetiapine is registered as one of her allergies but she is adamant that she has never tried Quetiapine.  Review of her lab results shows results are normal and the Benzodiazepine in her urine is prescribed by her PCP.  We discussed the addictive nature of Restoril and Alprazolam and that we need to start the weaning process.  Patient is in agreement and she is interested in outpatient Psychiatrist and therapist.  Patient reported that she was DR Affeines patient in the past  and she does not want to have him as her outpatient Psychiatrist.  Again this morning he denied feeling suicidal and was advised to approach Nursing staff for any need. Associated Signs/Symptoms: Depression Symptoms:  depressed mood, insomnia, psychomotor retardation, hopelessness, suicidal attempt, anxiety, disturbed sleep, weight loss, decreased appetite, Duration of Depression Symptoms: Less than two weeks  (Hypo) Manic Symptoms:  Labiality of Mood, Sadness, poor sleep and poor appetite Anxiety Symptoms:  Excessive Worry, Psychotic Symptoms:   na PTSD  Symptoms: NA Total Time spent with patient:  50 minutes  Past Psychiatric History: Depression, Anxiety, Bipolar disorder  Is the patient at risk to self? Yes.    Has the patient been a risk to self in the past 6 months? Yes.    Has the patient been a risk to self within the distant past? Yes.    Is the patient a risk  to others? No.  Has the patient been a risk to others in the past 6 months? No.  Has the patient been a risk to others within the distant past? No.   Prior Inpatient Therapy:   Prior Outpatient Therapy:    Alcohol Screening: 1. How often do you have a drink containing alcohol?: Never 2. How many drinks containing alcohol do you have on a typical day when you are drinking?: 1 or 2 3. How often do you have six or more drinks on one occasion?: Never AUDIT-C Score: 0 4. How often during the last year have you found that you were not able to stop drinking once you had started?: Never 5. How often during the last year have you failed to do what was normally expected from you because of drinking?: Never 6. How often during the last year have you needed a first drink in the morning to get yourself going after a heavy drinking session?: Never 7. How often during the last year have you had a feeling of guilt of remorse after drinking?: Never 8. How often during the last year have you been unable to remember what happened the night before because you had been drinking?: Never 9. Have you or someone else been injured as a result of your drinking?: No 10. Has a relative or friend or a doctor or another health worker been concerned about your drinking or suggested you cut down?: No Alcohol Use Disorder Identification Test Final Score (AUDIT): 0 Substance Abuse History in the last 12 months:  No. Consequences of Substance Abuse: NA Previous Psychotropic Medications: Yes  Psychological Evaluations: Yes  Past Medical History:  Past Medical History:  Diagnosis Date   Anxiety    Back pain    Bipolar 1 disorder (HCC)    Bipolar disorder (HCC)    Common migraine with intractable migraine 06/08/2014   Contraceptive management 10/05/2015   Depression    Epilepsy (HCC)    GERD (gastroesophageal reflux disease)    pregnancy related-rolaids prn   Headache(784.0)    Insomnia    Nexplanon in place 10/04/2015    Pelvic pain in female 10/04/2015   PONV (postoperative nausea and vomiting)    Sciatica    Seizures (HCC)     Past Surgical History:  Procedure Laterality Date   CESAREAN SECTION  2011,2008   CESAREAN SECTION N/A 05/18/2012   Procedure: CESAREAN SECTION;  Surgeon: Tilda Burrow, MD;  Location: [facility];  Service: Obstetrics;  Laterality: N/A;   CESAREAN SECTION WITH BILATERAL TUBAL LIGATION Bilateral 08/02/2016   Procedure: CESAREAN SECTION WITH BILATERAL TUBAL LIGATION;  Surgeon: Tilda Burrow, MD;  Location: [facility];  Service: Obstetrics;  Laterality: Bilateral;   INCISION AND DRAINAGE ABSCESS Left 09/10/2013   Procedure: INCISION AND DRAINAGE ABSCESS, left breast/axilla;  Surgeon: Dalia Heading, MD;  Location: [facility];  Service: General;  Laterality: Left;   TUBAL LIGATION     Family History:  Family History  Problem Relation Age of Onset   Diabetes Mother    Fibromyalgia Mother    Rheum arthritis  Mother    Diabetes Other    Hypertension Other    Cancer Other    Coronary artery disease Other    Tremor Maternal Grandmother    Cancer Maternal Grandmother        breast, uterine   Alzheimer's disease Maternal Grandmother    Stroke Maternal Grandmother    Coronary artery disease Maternal Grandfather    Diabetes Maternal Grandfather    Hypertension Maternal Grandfather    Diabetes Paternal Grandmother    Hypertension Paternal Grandmother    Cancer Paternal Grandmother        breast   Cancer Paternal Grandfather        pancreatic   Hypertension Paternal Grandfather    Seizures Neg Hx    Family Psychiatric  History: Mother, Half sister, half brother -Bipolar disorder. Father Father depression, Half Brother anxiety and depression. Tobacco Screening:   Social History:  Social History   Substance and Sexual Activity  Alcohol Use No     Social History   Substance and Sexual Activity  Drug Use No    Additional Social History:                            Allergies:   Allergies  Allergen Reactions   Clonazepam Other (See Comments)    Homicidal thoughts   Tramadol Itching and Other (See Comments)    Really bad itching, seizures Pt reports being able to tolerate morphine, dilaudid, percocet, vicodin with no problems noted in the past.   Flexeril [Cyclobenzaprine Hcl] Other (See Comments)    'really bad muscle spasms'   Naproxen Nausea And Vomiting and Other (See Comments)    Bad abdominal pain Pt can take ibuprofen   Prednisone     Hallucinations,  Anger    Trazodone And Nefazodone Other (See Comments)    Night terror   Cefixime Hives   Vistaril [Hydroxyzine Hcl] Other (See Comments)    'makes me really edgy'   Lab Results:  Results for orders placed or performed during the hospital encounter of 12/01/20 (from the past 48 hour(s))  Pregnancy, urine     Status: None   Collection Time: 12/01/20  6:34 PM  Result Value Ref Range   Preg Test, Ur NEGATIVE NEGATIVE    Comment:        THE SENSITIVITY OF THIS METHODOLOGY IS >20 mIU/mL. Performed at [facility], [address], [place], [place] [address]   Rapid urine drug screen (hospital performed)     Status: Abnormal   Collection Time: 12/01/20  6:34 PM  Result Value Ref Range   Opiates NONE DETECTED NONE DETECTED   Cocaine NONE DETECTED NONE DETECTED   Benzodiazepines POSITIVE (A) NONE DETECTED   Amphetamines NONE DETECTED NONE DETECTED   Tetrahydrocannabinol NONE DETECTED NONE DETECTED   Barbiturates NONE DETECTED NONE DETECTED    Comment: (NOTE) DRUG SCREEN FOR MEDICAL PURPOSES ONLY.  IF CONFIRMATION IS NEEDED FOR ANY PURPOSE, NOTIFY LAB WITHIN 5 DAYS.  LOWEST DETECTABLE LIMITS FOR URINE DRUG SCREEN Drug Class                     Cutoff (ng/mL) Amphetamine and metabolites    1000 Barbiturate and metabolites    200 Benzodiazepine                 200 Tricyclics and metabolites     300 Opiates and metabolites  300 Cocaine and metabolites         300 THC                            50 Performed at [facility], [address], [place], [place] [address]   TSH     Status: None   Collection Time: 12/02/20  6:24 AM  Result Value Ref Range   TSH 0.637 0.350 - 4.500 uIU/mL    Comment: Performed by a 3rd Generation assay with a functional sensitivity of <=0.01 uIU/mL. Performed at [facility], [address] [place], [place] [address]   Lipid panel     Status: None   Collection Time: 12/02/20  6:24 AM  Result Value Ref Range   Cholesterol 135 0 - 200 mg/dL   Triglycerides 57 <098 mg/dL   HDL 55 >11 mg/dL   Total CHOL/HDL Ratio 2.5 RATIO   VLDL 11 0 - 40 mg/dL   LDL Cholesterol 69 0 - 99 mg/dL    Comment:        Total Cholesterol/HDL:CHD Risk Coronary Heart Disease Risk Table                     Men   Women  1/2 Average Risk   3.4   3.3  Average Risk       5.0   4.4  2 X Average Risk   9.6   7.1  3 X Average Risk  23.4   11.0        Use the calculated Patient Ratio above and the CHD Risk Table to determine the patient's CHD Risk.        ATP III CLASSIFICATION (LDL):  <100     mg/dL   Optimal  914-782  mg/dL   Near or Above                    Optimal  130-159  mg/dL   Borderline  956-213  mg/dL   High  >086     mg/dL   Very High Performed at [facility], [address], [place], [place] [address]   Hemoglobin A1c     Status: None   Collection Time: 12/02/20  6:24 AM  Result Value Ref Range   Hgb A1c MFr Bld 4.8 4.8 - 5.6 %    Comment: (NOTE) Pre diabetes:          5.7%-6.4%  Diabetes:              >6.4%  Glycemic control for   <7.0% adults with diabetes    Mean Plasma Glucose 91.06 mg/dL    Comment: Performed at [facility], [address], [place], [place] [address]  Basic metabolic panel     Status: Abnormal   Collection Time: 12/02/20  6:24 AM  Result Value Ref Range   Sodium 137 135 - 145 mmol/L   Potassium 3.7 3.5 - 5.1 mmol/L    Chloride 104 98 - 111 mmol/L   CO2 25 22 - 32 mmol/L   Glucose, Bld 85 70 - 99 mg/dL    Comment: Glucose reference range applies only to samples taken after fasting for at least 8 hours.   BUN 16 6 - 20 mg/dL   Creatinine, Ser 2.84 0.44 - 1.00 mg/dL   Calcium 8.8 (L) 8.9 - 10.3 mg/dL   GFR, Estimated >13 >24 mL/min  Comment: (NOTE) Calculated using the CKD-EPI Creatinine Equation (2021)    Anion gap 8 5 - 15    Comment: Performed at [facility], [address] [place], [place] [address]    Blood Alcohol level:  Lab Results  Component Value Date   ETH <10 12/01/2020   [facility]  02/07/2010    <5        LOWEST DETECTABLE LIMIT FOR SERUM ALCOHOL IS 5 mg/dL FOR MEDICAL PURPOSES ONLY    Metabolic Disorder Labs:  Lab Results  Component Value Date   HGBA1C 4.8 12/02/2020   MPG 91.06 12/02/2020   No results found for: PROLACTIN Lab Results  Component Value Date   CHOL 135 12/02/2020   TRIG 57 12/02/2020   HDL 55 12/02/2020   CHOLHDL 2.5 12/02/2020   VLDL 11 12/02/2020   LDLCALC 69 12/02/2020   LDLCALC  11/04/2009    53        Total Cholesterol/HDL:CHD Risk Coronary Heart Disease Risk Table                     Men   Women  1/2 Average Risk   3.4   3.3  Average Risk       5.0   4.4  2 X Average Risk   9.6   7.1  3 X Average Risk  23.4   11.0        Use the calculated Patient Ratio above and the CHD Risk Table to determine the patient's CHD Risk.        ATP III CLASSIFICATION (LDL):  <100     mg/dL   Optimal  073-710  mg/dL   Near or Above                    Optimal  130-159  mg/dL   Borderline  626-948  mg/dL   High  >546     mg/dL   Very High    Current Medications: Current Facility-Administered Medications  Medication Dose Route Frequency Provider Last Rate Last Admin   acetaminophen (TYLENOL) tablet 650 mg  650 mg Oral Q6H PRN Oneta Rack, NP   650 mg at 12/02/20 1051   ALPRAZolam (XANAX) tablet 0.5 mg  0.5 mg Oral TID Dahlia Byes C, NP   0.5 mg at 12/02/20 1051   alum & mag hydroxide-simeth (MAALOX/MYLANTA) 200-200-20 MG/5ML suspension 30 mL  30 mL Oral Q4H PRN Oneta Rack, NP       busPIRone (BUSPAR) tablet 5 mg  5 mg Oral BID Dahlia Byes C, NP       gabapentin (NEURONTIN) capsule 300 mg  300 mg Oral TID Oneta Rack, NP   300 mg at 12/02/20 2703   levETIRAcetam (KEPPRA) tablet 250 mg  250 mg Oral BID Oneta Rack, NP   250 mg at 12/02/20 5009   loperamide (IMODIUM) capsule 2-4 mg  2-4 mg Oral PRN Comer Locket, MD       LORazepam (ATIVAN) tablet 1 mg  1 mg Oral Q6H PRN Bartholomew Crews E, MD   1 mg at 12/01/20 2305   magnesium hydroxide (MILK OF MAGNESIA) suspension 30 mL  30 mL Oral Daily PRN Oneta Rack, NP       nicotine (NICODERM CQ - dosed in mg/24 hours) patch 21 mg  21 mg Transdermal Daily Mason Jim, Female Iafrate E, MD   21 mg at 12/02/20 0839   ondansetron (ZOFRAN-ODT) disintegrating tablet  4 mg  4 mg Oral Q6H PRN Comer Locket, MD       QUEtiapine (SEROQUEL) tablet 50 mg  50 mg Oral QHS Onuoha, Josephine C, NP       temazepam (RESTORIL) capsule 15 mg  15 mg Oral QHS PRN Dahlia Byes C, NP       PTA Medications: Medications Prior to Admission  Medication Sig Dispense Refill Last Dose   acetaminophen (TYLENOL) 500 MG tablet Take 500 mg by mouth every 6 (six) hours as needed.      ALPRAZolam (XANAX) 0.5 MG tablet Take 0.5 mg by mouth 3 (three) times daily as needed.      docusate sodium (COLACE) 100 MG capsule Take 1 capsule (100 mg total) by mouth 2 (two) times daily as needed. (Patient not taking: Reported on 12/01/2020) 30 capsule 2    doxycycline (VIBRAMYCIN) 100 MG capsule Take 1 capsule (100 mg total) by mouth 2 (two) times daily. (Patient not taking: Reported on 12/01/2020) 20 capsule 0    gabapentin (NEURONTIN) 300 MG capsule Take 1 capsule (300 mg total) by mouth 3 (three) times daily. 90 capsule 0    HYDROcodone-acetaminophen (NORCO/VICODIN) 5-325 MG tablet Take 1 tablet by  mouth every 6 (six) hours as needed. (Patient not taking: No sig reported) 30 tablet 0    ibuprofen (ADVIL,MOTRIN) 200 MG tablet Take 200 mg by mouth every 6 (six) hours as needed.      ibuprofen (ADVIL,MOTRIN) 600 MG tablet Take 1 tablet (600 mg total) by mouth 4 (four) times daily. (Patient not taking: Reported on 12/01/2020) 30 tablet 0    levETIRAcetam (KEPPRA) 250 MG tablet Take 250 mg by mouth 2 (two) times daily.      Prenat w/o A-FeCbGl-[id number]-DHA (CITRANATAL ASSURE) 35-1 & 300 MG tablet One tablet and one capsule daily (Patient not taking: No sig reported) 60 tablet 11    temazepam (RESTORIL) 30 MG capsule Take 2 capsules by mouth at bedtime.       Musculoskeletal: Strength & Muscle Tone: within normal limits Gait & Station: normal Patient leans: Front            Psychiatric Specialty Exam:  Presentation  General Appearance:  Appropriate for Environment; Casual; Neat Eye Contact: Fair Speech: Clear and Coherent; Normal Rate Speech Volume: Normal Handedness: Right  Mood and Affect  Mood: Angry; Anxious; Depressed Affect: Congruent; Tearful; Depressed  Thought Process  Thought Processes: Coherent; Goal Directed Duration of Psychotic Symptoms: No data recorded Past Diagnosis of Schizophrenia or Psychoactive disorder: No  Descriptions of Associations:Intact Orientation:Full (Time, Place and Person) Thought Content:Logical Hallucinations:Hallucinations: None Ideas of Reference:None Suicidal Thoughts:Suicidal Thoughts: No Homicidal Thoughts:Homicidal Thoughts: No  Sensorium  Memory: Immediate Good; Recent Good; Remote Good Judgment: Fair Insight: Good  Executive Functions  Concentration: Good Attention Span: Good Recall: Good Fund of Knowledge: Good Language: Good  Psychomotor Activity  Psychomotor Activity: Psychomotor Activity: Normal  Assets  Assets: Communication Skills; Desire for Improvement; Housing; Resilience  Sleep   Sleep: Sleep: Poor Number of Hours of Sleep: 4   Physical Exam: Physical Exam Vitals and nursing note reviewed.  Constitutional:      Appearance: Normal appearance.  HENT:     Head: Normocephalic and atraumatic.  Cardiovascular:     Rate and Rhythm: Normal rate and regular rhythm.  Pulmonary:     Effort: Pulmonary effort is normal.  Musculoskeletal:        General: Normal range of motion.     Cervical back: Normal  range of motion.  Skin:    General: Skin is warm.  Neurological:     General: No focal deficit present.     Mental Status: She is alert and oriented to person, place, and time.   Review of Systems  Constitutional: Negative.   HENT: Negative.    Eyes: Negative.   Respiratory: Negative.    Cardiovascular: Negative.   Gastrointestinal: Negative.   Genitourinary: Negative.   Musculoskeletal: Negative.   Skin: Negative.   Neurological:  Positive for seizures.  Endo/Heme/Allergies: Negative.   Psychiatric/Behavioral:  Positive for depression. The patient is nervous/anxious and has insomnia.   Blood pressure 105/63, pulse 87, temperature 98.2 F (36.8 C), temperature source Oral, resp. rate 16, height  (1.676 m), weight 54.4 kg, SpO2 99 %, currently breastfeeding. Body mass index is 19.37 kg/m.  Treatment Plan Summary: Daily contact with patient to assess and evaluate symptoms and progress in treatment and Medication management Start Quetiapine 50 mg po at bed time for mood stabilization-plan to increase dose if tolerated Decrease Restoril to 15 mg po at bed time-Plan to wean off as Quetiapine is increased. Resume Alprazolam 0.5mg  po tid X 3 DAYS-Plan to wean off Start Buspirone 5 mg po bid for anxiety-Plan to increase before discharge home Resume Keppra 250 mg po bid for seizures Start Gabapentin 300 mg po tid for mood /anxiety Start Nicotine patch 21 mg/24 hrs transdermal for Nicotine Craving Offer Ativan 1 mg po Q 6 hrs for CIWA SCORE >10 PRN for  alcohol withdrawal symptoms Continue seizure precaution Encourage group attendance and participation.  Observation Level/Precautions:  15 minute checks Seizure  Laboratory:  CBC Chemistry Profile HbAIC HCG UDS TSH -All results normal.  Benzodiazepine positive-Patient taking prescribed Alprazolam.  Psychotherapy:  Encouraged to participate in all therapies offered in the unit  Medications:  See [facility]  Consultations:  NA  Discharge Concerns:  Safety  Estimated LOS: 3-7 days  Other:     Physician Treatment Plan for Primary Diagnosis: Bipolar affective disorder, depressed, severe, with psychotic behavior (HCC) Long Term Goal(s): Improvement in symptoms so as ready for discharge  Short Term Goals: Ability to identify changes in lifestyle to reduce recurrence of condition will improve, Ability to verbalize feelings will improve, Ability to disclose and discuss suicidal ideas, Ability to demonstrate self-control will improve, Ability to identify and develop effective coping behaviors will improve, Ability to maintain clinical measurements within normal limits will improve, Compliance with prescribed medications will improve, and Ability to identify triggers associated with substance abuse/mental health issues will improve  Physician Treatment Plan for Secondary Diagnosis: Principal Problem:   Bipolar affective disorder, depressed, severe, with psychotic behavior (HCC)  Long Term Goal(s): Improvement in symptoms so as ready for discharge  Short Term Goals: Ability to identify changes in lifestyle to reduce recurrence of condition will improve, Ability to verbalize feelings will improve, Ability to disclose and discuss suicidal ideas, Ability to demonstrate self-control will improve, Ability to identify and develop effective coping behaviors will improve, Ability to maintain clinical measurements within normal limits will improve, Compliance with prescribed medications will improve, and Ability to  identify triggers associated with substance abuse/mental health issues will improve  I certify that inpatient services furnished can reasonably be expected to improve the patient's condition.    Josephine Onuoha-PMHNP-BC 10/1/202212:38 PM

## 2020-12-02 NOTE — Plan of Care (Signed)
Expressing remorse in relation to recent self-harm behaviors. Was seen in the milieu and attended  evening group. Patient continues to request medications on frequent basis. Denying SI/HI. Reported passive auditory hallucinations. Injured area assessed: no sign of infection noted. Applied Neosporin to the affected area as indicated by provider. Emotional support provided. Patient currently sleeping and safety precautions maintained.

## 2020-12-02 NOTE — Plan of Care (Signed)
Patient remained anxious, sad and occasionally tearful. Focused on her husband "I guess he is happy to make me feel like this...". Frequently demanding medications.  Was unable to sleep on Restoril 30 mg. CIWA 10. Received Ativan per CIWA protocol. Patient slept about 3 hours then woke up complaining of pain at the injured area (wrists). Received Tylenol. Continues to frequently come to the medication window to ask for medications. Support and encouragements provided. Safety precautions reinforced.

## 2020-12-03 DIAGNOSIS — F332 Major depressive disorder, recurrent severe without psychotic features: Secondary | ICD-10-CM | POA: Diagnosis not present

## 2020-12-03 MED ORDER — LORAZEPAM 1 MG PO TABS
1.0000 mg | ORAL_TABLET | Freq: Three times a day (TID) | ORAL | Status: AC
Start: 2020-12-04 — End: 2020-12-04
  Administered 2020-12-04 (×3): 1 mg via ORAL
  Filled 2020-12-03 (×4): qty 1

## 2020-12-03 MED ORDER — LORAZEPAM 1 MG PO TABS
1.0000 mg | ORAL_TABLET | Freq: Every day | ORAL | Status: AC
  Administered 2020-12-06: 1 mg via ORAL
  Filled 2020-12-03: qty 1

## 2020-12-03 MED ORDER — QUETIAPINE FUMARATE 100 MG PO TABS
100.0000 mg | ORAL_TABLET | Freq: Every day | ORAL | Status: DC
  Administered 2020-12-03: 100 mg via ORAL
  Filled 2020-12-03 (×2): qty 1

## 2020-12-03 MED ORDER — HYDROXYZINE HCL 25 MG PO TABS
25.0000 mg | ORAL_TABLET | Freq: Three times a day (TID) | ORAL | Status: DC | PRN
  Administered 2020-12-04 – 2020-12-07 (×9): 25 mg via ORAL
  Filled 2020-12-03 (×8): qty 1

## 2020-12-03 MED ORDER — QUETIAPINE FUMARATE 25 MG PO TABS
25.0000 mg | ORAL_TABLET | Freq: Every day | ORAL | Status: DC
  Administered 2020-12-03: 25 mg via ORAL
  Filled 2020-12-03 (×3): qty 1

## 2020-12-03 MED ORDER — LORAZEPAM 1 MG PO TABS
1.0000 mg | ORAL_TABLET | Freq: Two times a day (BID) | ORAL | Status: AC
  Administered 2020-12-05 (×2): 1 mg via ORAL
  Filled 2020-12-03 (×2): qty 1

## 2020-12-03 MED ORDER — LORAZEPAM 1 MG PO TABS
1.0000 mg | ORAL_TABLET | Freq: Four times a day (QID) | ORAL | Status: AC
  Administered 2020-12-03 (×3): 1 mg via ORAL
  Filled 2020-12-03 (×2): qty 1

## 2020-12-03 NOTE — Group Note (Signed)
[facility] LCSW Group Therapy Note  Date/Time:  12/03/2020 10:15am-11:15am  Type of Therapy and Topic:  Group Therapy:  Healthy and Unhealthy Supports  Participation Level:  Active   Description of Group:  Patients in this group were introduced to the idea of adding a variety of healthy supports to address the various needs in their lives.Patients discussed what additional healthy supports could be helpful in their recovery and wellness after discharge in order to prevent future hospitalizations.   An emphasis was placed on using counselor, doctor, therapy groups, 12-step groups, and problem-specific support groups to expand supports.    Therapeutic Goals:   1)  discuss importance of adding supports to stay well once out of the hospital  2)  compare healthy versus unhealthy supports and identify some examples of each  3)  generate ideas and descriptions of healthy supports that can be added  4)  offer mutual support about how to address unhealthy supports  5)  encourage active participation in and adherence to discharge plan    Summary of Patient Progress:  The patient stated that current healthy supports in her life are her children while current unhealthy supports include her spouse and some friends in her environment.  The patient expressed a willingness to add support(s) to help in her recovery journey.   Therapeutic Modalities:   Motivational Interviewing Brief Solution-Focused Therapy  Ambrose Mantle, LCSW

## 2020-12-03 NOTE — Progress Notes (Signed)
The patient's day started as a 2 out of 10 but ended as a 7 out of 10. Patient states that she had a good visit with her sister who brought in clothing for her. Her goal for tomorrow is to "control my emotions".

## 2020-12-03 NOTE — Progress Notes (Signed)
Pt didn't attend orientation/goal setting group. 
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
## 2020-12-03 NOTE — [facility] Group Notes (Signed)
[facility] Group Notes:  (Nursing/MHT/Case Management/Adjunct)  Date:  12/03/2020  Time:  6:07 PM  Type of Therapy:  Psychoeducational Skills  Participation Level:  Active  Participation Quality:  Appropriate  Affect:  Appropriate  Cognitive:  Appropriate  Insight:  Appropriate  Engagement in Group:  Engaged  Modes of Intervention:  Education  Summary of Progress/Problems:Topic was healthy support systems. Pt says that her support system is her sister and her kids because she wants to think and be positive for them. She wants to set a good example for them to follow.   Aleeyah Bensen J Kaylei Frink 12/03/2020, 6:07 PM

## 2020-12-03 NOTE — Progress Notes (Signed)
   12/03/20 2100  Psych Admission Type (Psych Patients Only)  Admission Status Involuntary  Psychosocial Assessment  Patient Complaints Worrying  Eye Contact Fair  Facial Expression Worried;Flat  Affect Appropriate to circumstance  Speech Soft;Logical/coherent  Interaction Minimal  Motor Activity Slow  Appearance/Hygiene Improved  Behavior Characteristics Appropriate to situation  Mood Depressed  Aggressive Behavior  Effect No apparent injury  Thought Process  Coherency WDL  Content WDL  Delusions WDL  Perception Hallucinations  Hallucination Auditory ("muffled voices, bothersome to me last night")  Judgment Poor  Confusion WDL  Danger to Self  Current suicidal ideation? Denies  Danger to Others  Danger to Others None reported or observed

## 2020-12-03 NOTE — BHH Counselor (Signed)
Tonya Harmon, patient, provided written consent for CSW team to coordinate aftercare at this time. CSW to have further conversations with patient regarding care in the community.   Patient reports she does not want to continue services with Dr. Lolly Mustache @  [facility]. Expressed interest in [facility].   Signed:  Corky Crafts, MSW, LCSWA, LCASA 12/03/2020 10:50 AM
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
## 2020-12-03 NOTE — Progress Notes (Signed)
Pt did not attend therapeutic relaxation group. 
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
## 2020-12-03 NOTE — BHH Counselor (Signed)
CSW met with patient to discuss care for her 4 children.   During SPE call w/ patient's mother, mother expressed concern for wellbeing of children in mother/patient's absences. Stated that pt's husband, children's father, was unsuitable to care for the children.   Patient reports that the children are being cared for by her "sister" (not conventionally related) who lives next door. Children are attending school and patient reports no concerns for their safety.   No further action.   Signed:  Durenda Hurt, MSW, [place], LCASA 12/03/2020 1:38 PM

## 2020-12-03 NOTE — Progress Notes (Signed)
Pt is having an early supervised visit with her sister at this time in the piano room. Pt's sister is blind with a cane, can't drive and was accompanied to facility by her husband (sister's husband) who is in the lobby at this time. Visit was approved by [facility]. Safety checks maintained.

## 2020-12-04 ENCOUNTER — Encounter (HOSPITAL_COMMUNITY): Payer: Self-pay

## 2020-12-04 DIAGNOSIS — F315 Bipolar disorder, current episode depressed, severe, with psychotic features: Secondary | ICD-10-CM | POA: Diagnosis not present

## 2020-12-04 MED ORDER — TEMAZEPAM 7.5 MG PO CAPS
7.5000 mg | ORAL_CAPSULE | Freq: Every evening | ORAL | Status: DC | PRN
  Administered 2020-12-04 – 2020-12-07 (×4): 7.5 mg via ORAL
  Filled 2020-12-04 (×4): qty 1

## 2020-12-04 MED ORDER — QUETIAPINE FUMARATE 25 MG PO TABS
25.0000 mg | ORAL_TABLET | Freq: Once | ORAL | Status: AC
  Administered 2020-12-04: 25 mg via ORAL
  Filled 2020-12-04: qty 1

## 2020-12-04 MED ORDER — QUETIAPINE FUMARATE 50 MG PO TABS
125.0000 mg | ORAL_TABLET | Freq: Every day | ORAL | Status: DC
  Administered 2020-12-04 – 2020-12-05 (×2): 125 mg via ORAL
  Filled 2020-12-04 (×3): qty 2.5

## 2020-12-04 MED ORDER — QUETIAPINE FUMARATE 25 MG PO TABS
25.0000 mg | ORAL_TABLET | Freq: Every day | ORAL | Status: DC
  Administered 2020-12-05 – 2020-12-06 (×2): 25 mg via ORAL
  Filled 2020-12-04 (×3): qty 1

## 2020-12-04 MED ORDER — DOXYCYCLINE HYCLATE 100 MG PO TABS
100.0000 mg | ORAL_TABLET | Freq: Two times a day (BID) | ORAL | Status: DC
  Administered 2020-12-04 – 2020-12-08 (×8): 100 mg via ORAL
  Filled 2020-12-04 (×13): qty 1

## 2020-12-04 NOTE — Progress Notes (Addendum)
[facility] MD Progress Note  12/04/2020 12:06 PM Tonya Harmon  MRN:  [id number] Subjective:   Tonya Harmon is a 30 year old female with a past psychiatric history of bipolar disorder multiple suicide attempt, who was admitted to the psychiatric unit for safety and evaluation following suicide attempt by cutting both of her wrist, which required staples.   Psychiatric team made the following recommendations yesterday: -Start morning dose of Seroquel 25 mg -Continue Seroquel 100 mg nightly -Continue Ativan taper (for xanax detox)  -Continue Restoril -Continue gabapentin 100 mg 3 times daily -Continue other medications including Keppra  Case was discussed in the multidisciplinary team. MAR was reviewed and patient was compliant with medications.  During the interview today, the patient reports that she is doing well today.  Patient reports she feels less depressed, reports some improvement in her mood.  She reports that her sleep is improving, and she reports tolerating Ativan taper and reduction in Restoril. She reports she did ask for and receive Atarax as needed last night to help with sleep, which was effective.  Patient is agreeable to increasing Seroquel to help with sleep.  She reports that her appetite is doing good.   She denies having suicidal thoughts.  Denies having homicidal thoughts.  Denies having auditory and visual loose nations.   She reports that yesterday her sister came to visit her and did her a lot of good and also she brought her books to read.  She reports that one of her coping mechanisms is reading when she is stressed so this is been very helpful for her.  She reports that her plan is to stay at her sisters on discharge allowing her husband enough time to pack of his things and move out of the house.  She reports that she plans to wait until the end of the school year and in the [place].  She reports that she  does not want to take the kids out of school in the middle of the year and so this is why she will wait till then.    Patient is agreeable to increase Seroquel dose at night to help with sleep.  Patient is also agreeable to continue Ativan taper. She has no other concerns at present.   Principal Problem: Bipolar affective disorder, depressed, severe, with psychotic behavior (HCC) Diagnosis: Principal Problem:   Bipolar affective disorder, depressed, severe, with psychotic behavior (HCC)    Past Psychiatric History: Bipolar Disorder, MDD, Anxiety, and multiple suicide attempts  Past Medical History:  Past Medical History:  Diagnosis Date   Anxiety    Back pain    Bipolar 1 disorder (HCC)    Bipolar disorder (HCC)    Common migraine with intractable migraine 06/08/2014   Contraceptive management 10/05/2015   Depression    Epilepsy (HCC)    GERD (gastroesophageal reflux disease)    pregnancy related-rolaids prn   Headache(784.0)    Insomnia    Nexplanon in place 10/04/2015   Pelvic pain in female 10/04/2015   PONV (postoperative nausea and vomiting)    Sciatica    Seizures (HCC)     Past Surgical History:  Procedure Laterality Date   CESAREAN SECTION  2011,2008   CESAREAN SECTION N/A 05/18/2012   Procedure: CESAREAN SECTION;  Surgeon: Tilda Burrow, MD;  Location: [facility];  Service: Obstetrics;  Laterality: N/A;   CESAREAN SECTION WITH BILATERAL TUBAL LIGATION Bilateral 08/02/2016   Procedure: CESAREAN SECTION WITH  BILATERAL TUBAL LIGATION;  Surgeon: Tilda Burrow, MD;  Location: [facility];  Service: Obstetrics;  Laterality: Bilateral;   INCISION AND DRAINAGE ABSCESS Left 09/10/2013   Procedure: INCISION AND DRAINAGE ABSCESS, left breast/axilla;  Surgeon: Dalia Heading, MD;  Location: [facility];  Service: General;  Laterality: Left;   TUBAL LIGATION     Family History:  Family History  Problem Relation Age of Onset   Diabetes Mother    Fibromyalgia Mother    Rheum  arthritis Mother    Diabetes Other    Hypertension Other    Cancer Other    Coronary artery disease Other    Tremor Maternal Grandmother    Cancer Maternal Grandmother        breast, uterine   Alzheimer's disease Maternal Grandmother    Stroke Maternal Grandmother    Coronary artery disease Maternal Grandfather    Diabetes Maternal Grandfather    Hypertension Maternal Grandfather    Diabetes Paternal Grandmother    Hypertension Paternal Grandmother    Cancer Paternal Grandmother        breast   Cancer Paternal Grandfather        pancreatic   Hypertension Paternal Grandfather    Seizures Neg Hx    Family Psychiatric  History: Mother- Bipolar Disorder Half Sister- Bipolar Disorder Half Brother- Bipolar Disorder Father- Depression Half Brother- Depression and Anxiety Social History:  Social History   Substance and Sexual Activity  Alcohol Use No     Social History   Substance and Sexual Activity  Drug Use No    Social History   Socioeconomic History   Marital status: Married    Spouse name: Not on file   Number of children: Not on file   Years of education: Not on file   Highest education level: Not on file  Occupational History   Not on file  Tobacco Use   Smoking status: Former    Types: Cigarettes    Passive exposure: Never   Smokeless tobacco: Never  Vaping Use   Vaping Use: Never used  Substance and Sexual Activity   Alcohol use: No   Drug use: No   Sexual activity: Not Currently    Birth control/protection: None  Other Topics Concern   Not on file  Social History Narrative   Patient is right handed.   Patient drinks 2 cups of caffeine daily.   Social Determinants of Health   Financial Resource Strain: Not on file  Food Insecurity: Not on file  Transportation Needs: Not on file  Physical Activity: Not on file  Stress: Not on file  Social Connections: Not on file                          Sleep: Good  Appetite:  Good  Current  Medications: Current Facility-Administered Medications  Medication Dose Route Frequency Provider Last Rate Last Admin   acetaminophen (TYLENOL) tablet 650 mg  650 mg Oral Q6H PRN Oneta Rack, NP   650 mg at 12/04/20 0755   alum & mag hydroxide-simeth (MAALOX/MYLANTA) 200-200-20 MG/5ML suspension 30 mL  30 mL Oral Q4H PRN Oneta Rack, NP       busPIRone (BUSPAR) tablet 5 mg  5 mg Oral BID Dahlia Byes C, NP   5 mg at 12/04/20 0756   gabapentin (NEURONTIN) capsule 300 mg  300 mg Oral TID Oneta Rack, NP   300 mg at 12/04/20 1204  hydrOXYzine (ATARAX/VISTARIL) tablet 25 mg  25 mg Oral TID PRN Dahlia Byes C, NP   25 mg at 12/04/20 0108   levETIRAcetam (KEPPRA) tablet 250 mg  250 mg Oral BID Oneta Rack, NP   250 mg at 12/04/20 0755   loperamide (IMODIUM) capsule 2-4 mg  2-4 mg Oral PRN Comer Locket, MD       LORazepam (ATIVAN) tablet 1 mg  1 mg Oral Q6H PRN Comer Locket, MD   1 mg at 12/01/20 2305   LORazepam (ATIVAN) tablet 1 mg  1 mg Oral TID Dahlia Byes C, NP   1 mg at 12/04/20 1204   Followed by   Melene Muller ON 12/05/2020] LORazepam (ATIVAN) tablet 1 mg  1 mg Oral BID Dahlia Byes C, NP       Followed by   Melene Muller ON 12/06/2020] LORazepam (ATIVAN) tablet 1 mg  1 mg Oral Daily Onuoha, Josephine C, NP       magnesium hydroxide (MILK OF MAGNESIA) suspension 30 mL  30 mL Oral Daily PRN Oneta Rack, NP       neomycin-bacitracin-polymyxin (NEOSPORIN) ointment   Topical BID Comer Locket, MD   Given at 12/04/20 0755   nicotine (NICODERM CQ - dosed in mg/24 hours) patch 21 mg  21 mg Transdermal Daily Mason Jim, Amy E, MD   21 mg at 12/04/20 0755   ondansetron (ZOFRAN-ODT) disintegrating tablet 4 mg  4 mg Oral Q6H PRN Comer Locket, MD       QUEtiapine (SEROQUEL) tablet 125 mg  125 mg Oral QHS Lauro Franklin, MD       [START ON 12/05/2020] QUEtiapine (SEROQUEL) tablet 25 mg  25 mg Oral Q1200 Pashayan, Mardelle Matte, MD       QUEtiapine (SEROQUEL)  tablet 25 mg  25 mg Oral Once Lauro Franklin, MD       temazepam (RESTORIL) capsule 7.5 mg  7.5 mg Oral QHS PRN Lauro Franklin, MD        Lab Results: No results found for this or any previous visit (from the past 48 hour(s)).  Blood Alcohol level:  Lab Results  Component Value Date   ETH <10 12/01/2020   ETH  02/07/2010    <5        LOWEST DETECTABLE LIMIT FOR SERUM ALCOHOL IS 5 mg/dL FOR MEDICAL PURPOSES ONLY    Metabolic Disorder Labs: Lab Results  Component Value Date   HGBA1C 4.8 12/02/2020   MPG 91.06 12/02/2020   No results found for: PROLACTIN Lab Results  Component Value Date   CHOL 135 12/02/2020   TRIG 57 12/02/2020   HDL 55 12/02/2020   CHOLHDL 2.5 12/02/2020   VLDL 11 12/02/2020   LDLCALC 69 12/02/2020   LDLCALC  11/04/2009    53        Total Cholesterol/HDL:CHD Risk Coronary Heart Disease Risk Table                     Men   Women  1/2 Average Risk   3.4   3.3  Average Risk       5.0   4.4  2 X Average Risk   9.6   7.1  3 X Average Risk  23.4   11.0        Use the calculated Patient Ratio above and the CHD Risk Table to determine the patient's CHD Risk.        ATP III CLASSIFICATION (  LDL):  <100     mg/dL   Optimal  338-250  mg/dL   Near or Above                    Optimal  130-159  mg/dL   Borderline  539-767  mg/dL   High  >341     mg/dL   Very High    Physical Findings: AIMS:  , ,  ,  ,    CIWA:  CIWA-Ar Total: 2 COWS:     Musculoskeletal: Strength & Muscle Tone: within normal limits Gait & Station: normal Patient leans: N/A  Psychiatric Specialty Exam:  Presentation  General Appearance: Appropriate for Environment; Casual; Fairly Groomed  Eye Contact:Good  Speech:Clear and Coherent; Normal Rate  Speech Volume:Normal  Handedness:Right   Mood and Affect  Mood:-- (upbeat)  Affect:Congruent; Appropriate   Thought Process  Thought Processes:Coherent; Goal Directed  Descriptions of  Associations:Intact  Orientation:Full (Time, Place and Person)  Thought Content:Logical  History of Schizophrenia/Schizoaffective disorder:No  Duration of Psychotic Symptoms:No data recorded Hallucinations:Hallucinations: None  Ideas of Reference:None  Suicidal Thoughts:Suicidal Thoughts: No  Homicidal Thoughts:Homicidal Thoughts: No   Sensorium  Memory:Immediate Good; Recent Good  Judgment:Fair  Insight:Good   Executive Functions  Concentration:Good  Attention Span:Good  Recall:Good  Fund of Knowledge:Good  Language:Good   Psychomotor Activity  Psychomotor Activity:Psychomotor Activity: Normal   Assets  Assets:Communication Skills; Desire for Improvement; Financial Resources/Insurance; Resilience   Sleep  Number of Hours of Sleep: 6.75    Physical Exam: Physical Exam Vitals and nursing note reviewed.  Constitutional:      General: She is not in acute distress.    Appearance: Normal appearance. She is normal weight. She is not ill-appearing or toxic-appearing.  HENT:     Head: Normocephalic and atraumatic.  Pulmonary:     Effort: Pulmonary effort is normal.  Musculoskeletal:        General: Normal range of motion.  Neurological:     General: No focal deficit present.     Mental Status: She is alert.   Review of Systems  Respiratory:  Negative for cough and shortness of breath.   Cardiovascular:  Negative for chest pain.  Gastrointestinal:  Negative for abdominal pain, constipation, diarrhea, nausea and vomiting.  Neurological:  Negative for weakness and headaches.  Psychiatric/Behavioral:  Negative for hallucinations and suicidal ideas. The patient is not nervous/anxious.   Blood pressure 101/63, pulse 85, temperature 98.2 F (36.8 C), temperature source Oral, resp. rate 16, height 5\' 6"  (1.676 m), weight 54.4 kg, SpO2 100 %, currently breastfeeding. Body mass index is 19.37 kg/m.   Treatment Plan Summary: Daily contact with patient to  assess and evaluate symptoms and progress in treatment   Mrs. Harmon presents with a suicide attempt (cutting both wrists). PPHx is significant for Bipolar Disorder, MDD, Anxiety, and multiple suicide attempts.   She is continuing to improve.  Since her sleep is still a bit of an issue will increase her evening dose of Seroquel.  She also reports that her morning dose was not scheduled in the morning but instead scheduled for the afternoon so we will move that up into the morning.  We will continue with the Ativan taper which is set and on Wednesday.  We will decrease her Restoril further today.  We will continue to monitor her.   Bipolar Disorder: -Continue Seroquel 25 mg qam -Increase Seroquel from 100 mg to 125 mg QHS -Continue Gabapentin 300 mg TID  Generalized anxiety  disorder: -Continue Buspar 5 mg BID -Continue Ativan Taper ends 10/5  Insomnia: -Decrease Restoril to 7.5 mg QHS -Continue to taper   Nicotine Dependence: -Continue Nicotine Patch 21 mg daily   Seizure Disorder: -Continue Keppra 250 mg BID -Continue Seizure Precautions   -Continue Neosporin to both wrists BID -Continue PRN's: Tylenol, Maalox, Atarax, Milk of Magnesia, Trazodone   Lauro Franklin, MD 12/04/2020, 12:06 PM

## 2020-12-04 NOTE — BHH Group Notes (Signed)
Adult Psychoeducational Group Note  Date:  12/04/2020 Time:  6:44 PM  Group Topic/Focus:  Wellness Toolbox:   The focus of this group is to discuss various aspects of wellness, balancing those aspects and exploring ways to increase the ability to experience wellness.  Patients will create a wellness toolbox for use upon discharge.  Participation Level:  Did Not Attend   Tonya Harmon 12/04/2020, 6:44 PM

## 2020-12-04 NOTE — Group Note (Addendum)
LCSW Group Therapy Note   Group Date: 12/04/2020 Start Time: 1300 End Time: 1400   Type of Therapy and Topic:  Group Therapy:   Participation Level:  Active  Patients received a worksheet with an outline of 2 gingerbread men with a separation in the middle of the page. One sign designated what the pt sees about themselves and the other is what others see. Pts were asked to introduce themselves and share something they like about themself. Pts were then asked to draw, write or color how they view themselves as well as how they are viewed by others. CSW led discussion about the feelings and words associated with each side.    Patient Summary: During introductions pt shared their name and stated they liked their ability to be outgoing. Pt was appropriate and participated in group discussion.  Aram Beecham, LCSWA 12/04/2020  2:39 PM

## 2020-12-04 NOTE — Progress Notes (Signed)
Adult Psychoeducational Group Note  Date:  12/04/2020 Time:  9:00 PM  Group Topic/Focus:  Wrap-Up Group:   The focus of this group is to help patients review their daily goal of treatment and discuss progress on daily workbooks.  Participation Level:  Active  Participation Quality:  Appropriate  Affect:  Appropriate  Cognitive:  Appropriate  Insight: Appropriate  Engagement in Group:  Improving  Modes of Intervention:  Discussion  Additional Comments:   Pt stated her goal for today was to focus on her treatment plan. Pt stated she accomplished her goal today. Pt stated she talked with her doctor and her social worker about her care today. Pt rated her overall day a 8 out of 10. Pt stated she was able to contact her sister today which improved her overall day. Pt stated she felt better about herself tonight. Pt stated she was able to attend all groups held today. Pt stated she was able to attend all meals. Pt stated she took all medications provided today. Pt stated her appetite was pretty good today. Pt rated her sleep last night was fair. Pt stated the goal tonight was to get some rest. Pt stated she had some physical pain tonight. Pt stated she had some severe pain in right lower arm. Pt rated the severe pain in her right lower arm a 9 on the pain level scale. Pt denied dealing with some visual hallucinations and auditory issues tonight.  Pt denies thoughts of harming herself or others. Pt stated she would alert staff if anything changed.  Felipa Furnace 12/04/2020, 9:00 PM

## 2020-12-04 NOTE — Progress Notes (Signed)
   12/04/20 1000  Psych Admission Type (Psych Patients Only)  Admission Status Involuntary  Psychosocial Assessment  Patient Complaints Worrying  Eye Contact Fair  Facial Expression Worried;Flat  Affect Appropriate to circumstance  Speech Soft;Logical/coherent  Interaction Minimal  Motor Activity Slow  Appearance/Hygiene Improved  Behavior Characteristics Appropriate to situation  Mood Depressed  Aggressive Behavior  Effect No apparent injury  Thought Process  Coherency WDL  Content WDL  Delusions WDL  Perception Hallucinations  Hallucination Auditory ("muffled voices, bothersome to me last night")  Judgment Poor  Confusion WDL  Danger to Self  Current suicidal ideation? Denies  Danger to Others  Danger to Others None reported or observed  Dar Note: Patient presents with anxious affect and mood.  Denies suicidal thoughts, auditory and visual hallucinations.  Medications given as prescribed.  Routine safety checks maintained.  Attended group and participated.  Observed interacting with peers in milieu.  Patient is safe on and off the unit.

## 2020-12-04 NOTE — BH IP Treatment Plan (Signed)
Interdisciplinary Treatment and Diagnostic Plan Update  12/04/2020 Time of Session: 9:30am Tonya Harmon [facility] MRN: [id number]  Principal Diagnosis: Bipolar affective disorder, depressed, severe, with psychotic behavior ([place])  Secondary Diagnoses: Principal Problem:   Bipolar affective disorder, depressed, severe, with psychotic behavior ([place])   Current Medications:  Current Facility-Administered Medications  Medication Dose Route Frequency Provider Last Rate Last Admin   acetaminophen (TYLENOL) tablet 650 mg  650 mg Oral Q6H PRN Derrill Center, NP   650 mg at 12/04/20 0755   alum & mag hydroxide-simeth (MAALOX/MYLANTA) 200-200-20 MG/5ML suspension 30 mL  30 mL Oral Q4H PRN Derrill Center, NP       busPIRone (BUSPAR) tablet 5 mg  5 mg Oral BID Charmaine Downs C, NP   5 mg at 12/04/20 0756   gabapentin (NEURONTIN) capsule 300 mg  300 mg Oral TID Derrill Center, NP   300 mg at 12/04/20 0755   hydrOXYzine (ATARAX/VISTARIL) tablet 25 mg  25 mg Oral TID PRN Charmaine Downs C, NP   25 mg at 12/04/20 0108   levETIRAcetam (KEPPRA) tablet 250 mg  250 mg Oral BID Derrill Center, NP   250 mg at 12/04/20 0755   loperamide (IMODIUM) capsule 2-4 mg  2-4 mg Oral PRN Harlow Asa, MD       LORazepam (ATIVAN) tablet 1 mg  1 mg Oral Q6H PRN Harlow Asa, MD   1 mg at 12/01/20 2305   LORazepam (ATIVAN) tablet 1 mg  1 mg Oral TID Charmaine Downs C, NP   1 mg at 12/04/20 0755   Followed by   Derrill Memo ON 12/05/2020] LORazepam (ATIVAN) tablet 1 mg  1 mg Oral BID Cleatrice Burke, Josephine C, NP       Followed by   Derrill Memo ON 12/06/2020] LORazepam (ATIVAN) tablet 1 mg  1 mg Oral Daily Onuoha, Josephine C, NP       magnesium hydroxide (MILK OF MAGNESIA) suspension 30 mL  30 mL Oral Daily PRN Derrill Center, NP       neomycin-bacitracin-polymyxin (NEOSPORIN) ointment   Topical BID Harlow Asa, MD   Given at 12/04/20 0755   nicotine (NICODERM CQ - dosed in mg/24 hours) patch 21 mg  21 mg  Transdermal Daily Nelda Marseille, Amy E, MD   21 mg at 12/04/20 0755   ondansetron (ZOFRAN-ODT) disintegrating tablet 4 mg  4 mg Oral Q6H PRN Harlow Asa, MD       QUEtiapine (SEROQUEL) tablet 100 mg  100 mg Oral QHS Onuoha, Josephine C, NP   100 mg at 12/03/20 2104   QUEtiapine (SEROQUEL) tablet 25 mg  25 mg Oral Q1200 Charmaine Downs C, NP   25 mg at 12/03/20 1216   temazepam (RESTORIL) capsule 15 mg  15 mg Oral QHS PRN Charmaine Downs C, NP   15 mg at 12/03/20 2104   PTA Medications: Medications Prior to Admission  Medication Sig Dispense Refill Last Dose   acetaminophen (TYLENOL) 500 MG tablet Take 500 mg by mouth every 6 (six) hours as needed.      ALPRAZolam (XANAX) 0.5 MG tablet Take 0.5 mg by mouth 3 (three) times daily as needed.      docusate sodium (COLACE) 100 MG capsule Take 1 capsule (100 mg total) by mouth 2 (two) times daily as needed. (Patient not taking: Reported on 12/01/2020) 30 capsule 2    doxycycline (VIBRAMYCIN) 100 MG capsule Take 1 capsule (100 mg total) by mouth 2 (two) times daily. (  Patient not taking: Reported on 12/01/2020) 20 capsule 0    gabapentin (NEURONTIN) 300 MG capsule Take 1 capsule (300 mg total) by mouth 3 (three) times daily. 90 capsule 0    HYDROcodone-acetaminophen (NORCO/VICODIN) 5-325 MG tablet Take 1 tablet by mouth every 6 (six) hours as needed. (Patient not taking: No sig reported) 30 tablet 0    ibuprofen (ADVIL,MOTRIN) 200 MG tablet Take 200 mg by mouth every 6 (six) hours as needed.      ibuprofen (ADVIL,MOTRIN) 600 MG tablet Take 1 tablet (600 mg total) by mouth 4 (four) times daily. (Patient not taking: Reported on 12/01/2020) 30 tablet 0    levETIRAcetam (KEPPRA) 250 MG tablet Take 250 mg by mouth 2 (two) times daily.      Prenat w/o A-[id number] (CITRANATAL ASSURE) 35-1 & 300 MG tablet One tablet and one capsule daily (Patient not taking: No sig reported) 60 tablet 11    temazepam (RESTORIL) 30 MG capsule Take 2 capsules by mouth at  bedtime.       Patient Stressors: Marital or family conflict    Patient Strengths: Ability for Health and safety inspector for treatment/growth  Supportive family/friends   Treatment Modalities: Medication Management, Group therapy, Case management,  1 to 1 session with clinician, Psychoeducation, Recreational therapy.   Physician Treatment Plan for Primary Diagnosis: Bipolar affective disorder, depressed, severe, with psychotic behavior ([place]) Long Term Goal(s): Improvement in symptoms so as ready for discharge   Short Term Goals: Ability to identify changes in lifestyle to reduce recurrence of condition will improve Ability to verbalize feelings will improve Ability to disclose and discuss suicidal ideas Ability to demonstrate self-control will improve Ability to identify and develop effective coping behaviors will improve Ability to maintain clinical measurements within normal limits will improve Compliance with prescribed medications will improve Ability to identify triggers associated with substance abuse/mental health issues will improve  Medication Management: Evaluate patient's response, side effects, and tolerance of medication regimen.  Therapeutic Interventions: 1 to 1 sessions, Unit Group sessions and Medication administration.  Evaluation of Outcomes: Not Met  Physician Treatment Plan for Secondary Diagnosis: Principal Problem:   Bipolar affective disorder, depressed, severe, with psychotic behavior ([place])  Long Term Goal(s): Improvement in symptoms so as ready for discharge   Short Term Goals: Ability to identify changes in lifestyle to reduce recurrence of condition will improve Ability to verbalize feelings will improve Ability to disclose and discuss suicidal ideas Ability to demonstrate self-control will improve Ability to identify and develop effective coping behaviors will improve Ability to maintain clinical measurements within normal limits  will improve Compliance with prescribed medications will improve Ability to identify triggers associated with substance abuse/mental health issues will improve     Medication Management: Evaluate patient's response, side effects, and tolerance of medication regimen.  Therapeutic Interventions: 1 to 1 sessions, Unit Group sessions and Medication administration.  Evaluation of Outcomes: Not Met   RN Treatment Plan for Primary Diagnosis: Bipolar affective disorder, depressed, severe, with psychotic behavior ([place]) Long Term Goal(s): Knowledge of disease and therapeutic regimen to maintain health will improve  Short Term Goals: Ability to remain free from injury will improve, Ability to verbalize frustration and anger appropriately will improve, Ability to demonstrate self-control, and Compliance with prescribed medications will improve  Medication Management: RN will administer medications as ordered by provider, will assess and evaluate patient's response and provide education to patient for prescribed medication. RN will report any adverse and/or side effects to prescribing provider.  Therapeutic Interventions: 1 on 1 counseling sessions, Psychoeducation, Medication administration, Evaluate responses to treatment, Monitor vital signs and CBGs as ordered, Perform/monitor CIWA, COWS, AIMS and Fall Risk screenings as ordered, Perform wound care treatments as ordered.  Evaluation of Outcomes: Not Met   LCSW Treatment Plan for Primary Diagnosis: Bipolar affective disorder, depressed, severe, with psychotic behavior ([place]) Long Term Goal(s): Safe transition to appropriate next level of care at discharge, Engage patient in therapeutic group addressing interpersonal concerns.  Short Term Goals: Engage patient in aftercare planning with referrals and resources, Increase social support, Increase ability to appropriately verbalize feelings, and Increase skills for wellness and recovery  Therapeutic  Interventions: Assess for all discharge needs, 1 to 1 time with Social worker, Explore available resources and support systems, Assess for adequacy in community support network, Educate family and significant other(s) on suicide prevention, Complete Psychosocial Assessment, Interpersonal group therapy.  Evaluation of Outcomes: Not Met   Progress in Treatment: Attending groups: Yes. Participating in groups: Yes. Taking medication as prescribed: Yes. Toleration medication: Yes. Family/Significant other contact made: Yes, individual(s) contacted:  mother Patient understands diagnosis: Yes. Discussing patient identified problems/goals with staff: Yes. Medical problems stabilized or resolved: Yes. Denies suicidal/homicidal ideation: Yes. Issues/concerns per patient self-inventory: No.   New problem(s) identified: No, Describe:  none  New Short Term/Long Term Goal(s): medication stabilization, elimination of SI thoughts, development of comprehensive mental wellness plan.    Patient Goals:  "To learn better coping skills with depression and during crisis"  Discharge Plan or Barriers: Pt will follow up at [facility] in [place] for therapy and medication management.  Reason for Continuation of Hospitalization: Anxiety Depression Medication stabilization Suicidal ideation  Estimated Length of Stay: 3-5 days   Scribe for Treatment Team: Vassie Moselle, LCSW 12/04/2020 10:26 AM

## 2020-12-04 NOTE — Progress Notes (Addendum)
Notified by nursing staff that patient's right forearm/wrist laceration appears to be infected at this time.  Patient seen and examined by myself.  Patient states that her right forearm/wrist lesion is slightly painful without palpation, but she reports that this lesion and surrounding areas are very painful to palpation.  Patient describes this pain to palpation of this lesion as an 8-9 out of 10.  Patient also states that she has noticed some drainage from this lesion on her right forearm/wrist and she describes this drainage as green in appearance.  Patient also states that she has felt some pretty consistent "throbbing" sensations in her right forearm/wrist area around this lesion.  Patient's right volar forearm/wrist laceration lesion appears to have some areas of erythema surrounding the lesion with what appears to be evidence of dried purulent drainage in the center of the lesion between the staples of the lesion, and patient's skin surrounding the lesion is mildly warm to palpation.  Staples of this right forearm/wrist lesion appeared to be fully intact.  Radial pulses 2+ bilaterally. Mild swelling noted on volar surface of patient's right forearm/wrist area surrounding right forearm/wrist lesion as well.  Patient's left forearm/wrist laceration appears to be healing appropriately with staples noted to be fully intact and this lesion appears to be without any signs of apparent erythema, drainage, warmth, or infection.  Patient denies any fever, chills, headache, lightheadedness, dizziness, recent loss of consciousness, vision changes, chest pain, shortness of breath, nausea, vomiting, abdominal pain, diarrhea, constipation, or any additional physical symptoms on exam at this time.  Per chart review, patient's recent vital signs from 2028 show BP 104/72 and pulse at 93 bpm.  Patient's most recent temperature from 12/04/2020 at 0622 was afebrile at 98.2 F.   Based on patient's current symptomatology and  presentation, do not believe that patient's condition is emergent at this time but I do believe that patient should be initiated on antibiotic therapy at this time. Will initiate empiric antibiotic treatment for potential development of skin infection/cellulitis of laceration lesion of the volar surface of patient's right forearm/wrist.  Was initially going to initiate Keflex 500 mg p.o. every 6 hours/4 times daily for 5 days for this antibiotic coverage, but will not initiate this medication at this time due to patient's allergy to cefixime (patient reports that she breaks out in hives when she takes this medication) and potential allergic reaction the patient could experience to Keflex due to this allergy.  Will initiate doxycycline 100 mg p.o. twice daily for 10 days instead for coverage of potential development of skin infection/cellulitis of right forearm/wrist volar surface laceration lesion.  Patient instructed to wash both of her left and right volar forearm/wrist laceration lesions at least 2 to 3 times per day with cold water and soap.  Patient may also continue to apply Neosporin to these left and right forearm/wrist laceration lesions twice daily.  Nursing staff to update dayshift of the overall plan upon shift change in order for dayshift treatment team to determine if wound care consult/further work-up is potentially necessary in the future during patient's [facility] stay.  Patient instructed to notify nursing staff if any of her symptoms worsen or if any new symptoms/issues arise.

## 2020-12-04 NOTE — Group Note (Signed)
Occupational Therapy Group Note  Group Topic:Communication  Group Date: 12/04/2020 Start Time: 1400 End Time: 1445 Facilitators: Donne Hazel, OT/L   Group Description: Group encouraged increased engagement and participation through discussion focused on communication styles. Patients were educated on the different styles of communication including passive, aggressive, assertive, and passive-aggressive communication. Group members shared and reflected on which styles they most often find themselves communicating in and brainstormed strategies on how to transition and practice a more assertive approach. Further discussion explored how to use assertiveness skills and strategies to further advocate and ask questions as it relates to their treatment plan and mental health.   Therapeutic Goal(s): Identify practical strategies to improve communication skills  Identify how to use assertive communication skills to address individual needs and wants   Participation Level: Active   Participation Quality: Independent   Behavior: Calm, Cooperative, and Interactive    Speech/Thought Process: Focused   Affect/Mood: Euthymic   Insight: Fair   Judgement: Fair   Individualization: Tonya Harmon was active in their participation of group discussion/activity. Pt identified being a passive communicator "102% of the time" and expressed most often communicating with someone who is aggressive. Receptive to education received on being more assertive.   Modes of Intervention: Discussion and Education  Patient Response to Interventions:  Attentive and Engaged   Plan: Continue to engage patient in OT groups 2 - 3x/week.  12/04/2020  Donne Hazel, OT/L

## 2020-12-04 NOTE — Group Note (Signed)
Recreation Therapy Group Note   Group Topic:Coping Skills  Group Date: 12/04/2020 Start Time: 1000 End Time: 1035 Facilitators: Caroll Rancher, LRT/CTRS Location: [address] Dayroom  Goal Area(s) Addresses:  Patient will identify positive coping skills. Patient will identify benefit of using positive coping skills post d/c.  Group Description:  Mind Map.  Patients were given a blank diagram of a mind map.  LRT and patients identified 8 instances in which positive coping skills can be used (depression, trauma, anger, anxiety, stress, tragedy, drama and sickness).  Patients were then given time to come up with at least 3 coping skills for each instance identified.  LRT would then fill in the coping skills on the board giving any one with blank boxes on their chart to fill them in coping skills they did not have.      Affect/Mood: Appropriate   Participation Level: Engaged   Participation Quality: Independent   Behavior: Appropriate   Speech/Thought Process: Focused   Insight: Good   Judgement: Good   Modes of Intervention: Guided Discussion and Worksheet   Patient Response to Interventions:  Engaged   Education Outcome:  Acknowledges education and In group clarification offered    Clinical Observations/Individualized Feedback: Pt was bright and engaged during group session.  Pt was active in the discussion and identified some coping skills like take a break, talk to family, stress ball, breathing exercises and music.   Plan: Continue to engage patient in RT group sessions 2-3x/week.   Caroll Rancher, LRT/CTRS 12/04/2020 1:11 PM

## 2020-12-05 DIAGNOSIS — F315 Bipolar disorder, current episode depressed, severe, with psychotic features: Secondary | ICD-10-CM | POA: Diagnosis not present

## 2020-12-05 MED ORDER — WHITE PETROLATUM EX OINT
TOPICAL_OINTMENT | CUTANEOUS | Status: AC
  Administered 2020-12-05: 1
  Filled 2020-12-05: qty 5

## 2020-12-05 NOTE — Progress Notes (Signed)
Wound care provided. Ointment placed on wounds. Dressings placed bilaterally on wrists.

## 2020-12-05 NOTE — [facility] Group Notes (Signed)
The focus of this group is to help patients establish daily goals to achieve during treatment and discuss how the patient can incorporate goal setting into their daily lives to aide in recovery.  Pt attended group and had great participation this morning.

## 2020-12-05 NOTE — Progress Notes (Addendum)
   Pt presents with worry on assessment.  Pt observed lying in bed, reading, in her room.  Pt states, "I am worried because my 32 year old son is starting to get in trouble.  Pt further states worried that, "right forearm cut might be infected."  Assessed, and right forearm cut was visibly swollen, as opposed to the left forearm arm cut.  Provider notified.  Pt denies SI/HI, and verbally contracts for safety.  Pt denies AVH.  Pt remains safe on unit with Q 15 minute safety checks.   12/04/20 2118  Psych Admission Type (Psych Patients Only)  Admission Status Involuntary  Psychosocial Assessment  Patient Complaints Worrying (pt states, "51 year old son starting to get in trouble.")  Eye Contact Fair  Facial Expression Worried;Flat  Affect Appropriate to circumstance  Speech Soft;Logical/coherent  Interaction Minimal  Motor Activity Slow  Appearance/Hygiene Improved  Behavior Characteristics Appropriate to situation  Mood Depressed  Thought Process  Coherency WDL  Content WDL  Delusions WDL  Perception Hallucinations  Hallucination None reported or observed  Judgment Poor  Confusion WDL  Danger to Self  Current suicidal ideation? Denies  Danger to Others  Danger to Others None reported or observed

## 2020-12-05 NOTE — Progress Notes (Signed)
Pt visible on the unit, pt stated she was doing better, pt stated AVH  better    12/05/20 2000  Psych Admission Type (Psych Patients Only)  Admission Status Involuntary  Psychosocial Assessment  Patient Complaints Anxiety;Worrying  Eye Contact Fair  Facial Expression Animated;Anxious  Affect Anxious;Appropriate to circumstance  Speech Logical/coherent  Interaction Assertive  Motor Activity Slow  Appearance/Hygiene Unremarkable  Behavior Characteristics Cooperative;Appropriate to situation  Mood Anxious  Thought Process  Coherency WDL  Content Blaming others  Delusions WDL  Perception Hallucinations  Hallucination Auditory  Judgment Poor  Confusion None  Danger to Self  Current suicidal ideation? Denies  Danger to Others  Danger to Others None reported or observed

## 2020-12-05 NOTE — Plan of Care (Signed)
°  Problem: Education: °Goal: Emotional status will improve °Outcome: Progressing °Goal: Mental status will improve °Outcome: Progressing °Goal: Verbalization of understanding the information provided will improve °Outcome: Progressing °  °

## 2020-12-05 NOTE — Group Note (Signed)
Recreation Therapy Group Note   Group Topic:Stress Management  Group Date: 12/05/2020 Start Time: 0900 End Time: 0950 Facilitators: Caroll Rancher, LRT/CTRS Location: 300 [address] Dayroom  Goal Area(s) Addresses:  Patient will actively participate in stress management techniques presented during session.  Patient will successfully identify benefit of practicing stress management post d/c.    Group Description:  Meditation. LRT provided education and instruction for meditation. Patient was asked to participate in the technique introduced during session. LRT debriefed on technique used during group session. Patients were given suggestions of ways to access scripts post d/c and encouraged to explore Youtube and other apps available on smartphones, tablets, and computers.   Affect/Mood: Appropriate   Participation Level: Engaged   Participation Quality: Independent   Behavior: Appropriate   Speech/Thought Process: Focused   Insight: Good   Judgement: Good   Modes of Intervention: Meditation   Patient Response to Interventions:  Attentive and Engaged   Education Outcome:  Acknowledges education and In group clarification offered    Clinical Observations/Individualized Feedback: Pt sat quietly and engaged in the meditation.  Pt was appropriate and appeared focused and the meditation played.  Pt was smiling and engaging with peers as discussion about meditation occurred.    Plan: Continue to engage patient in RT group sessions 2-3x/week.   Caroll Rancher, LRT/CTRS 12/05/2020 11:51 AM

## 2020-12-05 NOTE — Progress Notes (Addendum)
[facility] MD Progress Note  12/05/2020 11:53 AM Tonya Harmon  MRN:  [id number] Subjective:   [name] is a 30 year old female with a past psychiatric history of bipolar disorder multiple suicide attempt, who was admitted to the psychiatric unit for safety and evaluation following suicide attempt by cutting both of her wrist, which required staples.    Psychiatric team made the following recommendations yesterday: -Continue Seroquel 25 mg AM -Increase Seroquel from 100 mg to 125 mg QHS -Continue Ativan taper (for xanax detox)  -Decrease Restoril from 15 mg to 7.5 mg QHS -Continue gabapentin 100 mg 3 times daily -Continue other medications including Keppra   Case was discussed in the multidisciplinary team. MAR was reviewed and patient was compliant with medications.   She reports that her mood is some better. She reports that she woke a few times during the night, and this impacted her sleep.  She reports that her appetite is adequate and stable. She denies having suicidal or homicidal thoughts She reports that last night she did hear dogs, however, she reports that she knows that this was not true and it was not disturbing to her.   Discussed further increasing her Seroquel to help with her sleep and she was agreeable to this.  She reports that her staple site on her right wrist is quite painful today.  She reports that this is also making her headache worse.   She states that the overnight provider started her on doxycycline and she is tolerated it well so far.  Discussed with her that I would consult wound care for further recommendations.  She has no other concerns at present.  Consulted wound care Melody [place], who recommended starting dry dressing applications to the site.  She recommended keeping the site dry without washing. She recommended continuing with currently ordered antibiotics. She recommended that if the wound starts to dehisce or purulence begins to  drain out of the incision that patient needs to return to the ED for further care and management.  Principal Problem: Bipolar affective disorder, depressed, severe, with psychotic behavior (HCC) Diagnosis: Principal Problem:   Bipolar affective disorder, depressed, severe, with psychotic behavior (HCC)    Past Psychiatric History: Bipolar Disorder, MDD, Anxiety, and multiple suicide attempts  Past Medical History:  Past Medical History:  Diagnosis Date   Anxiety    Back pain    Bipolar 1 disorder (HCC)    Bipolar disorder (HCC)    Common migraine with intractable migraine 06/08/2014   Contraceptive management 10/05/2015   Depression    Epilepsy (HCC)    GERD (gastroesophageal reflux disease)    pregnancy related-rolaids prn   Headache(784.0)    Insomnia    Nexplanon in place 10/04/2015   Pelvic pain in female 10/04/2015   PONV (postoperative nausea and vomiting)    Sciatica    Seizures (HCC)     Past Surgical History:  Procedure Laterality Date   CESAREAN SECTION  2011,2008   CESAREAN SECTION N/A 05/18/2012   Procedure: CESAREAN SECTION;  Surgeon: Tilda Burrow, MD;  Location: [facility];  Service: Obstetrics;  Laterality: N/A;   CESAREAN SECTION WITH BILATERAL TUBAL LIGATION Bilateral 08/02/2016   Procedure: CESAREAN SECTION WITH BILATERAL TUBAL LIGATION;  Surgeon: Tilda Burrow, MD;  Location: [facility] BIRTHING SUIT[facility];  Service: Obstetrics;  Laterality: Bilateral;   INCISION AND DRAINAGE ABSCESS Left 09/10/2013   Procedure: INCISION AND DRAINAGE ABSCESS, left breast/axilla;  Surgeon: Dalia Heading, MD;  Location: [facility];  Service: General;  Laterality: Left;   TUBAL LIGATION     Family History:  Family History  Problem Relation Age of Onset   Diabetes Mother    Fibromyalgia Mother    Rheum arthritis Mother    Diabetes Other    Hypertension Other    Cancer Other    Coronary artery disease Other    Tremor Maternal Grandmother    Cancer Maternal Grandmother        breast, uterine    Alzheimer's disease Maternal Grandmother    Stroke Maternal Grandmother    Coronary artery disease Maternal Grandfather    Diabetes Maternal Grandfather    Hypertension Maternal Grandfather    Diabetes Paternal Grandmother    Hypertension Paternal Grandmother    Cancer Paternal Grandmother        breast   Cancer Paternal Grandfather        pancreatic   Hypertension Paternal Grandfather    Seizures Neg Hx    Family Psychiatric  History: Mother- Bipolar Disorder Half Sister- Bipolar Disorder Half Brother- Bipolar Disorder Father- Depression Half Brother- Depression and Anxiety Social History:  Social History   Substance and Sexual Activity  Alcohol Use No     Social History   Substance and Sexual Activity  Drug Use No    Social History   Socioeconomic History   Marital status: Married    Spouse name: Not on file   Number of children: Not on file   Years of education: Not on file   Highest education level: Not on file  Occupational History   Not on file  Tobacco Use   Smoking status: Former    Types: Cigarettes    Passive exposure: Never   Smokeless tobacco: Never  Vaping Use   Vaping Use: Never used  Substance and Sexual Activity   Alcohol use: No   Drug use: No   Sexual activity: Not Currently    Birth control/protection: None  Other Topics Concern   Not on file  Social History Narrative   Patient is right handed.   Patient drinks 2 cups of caffeine daily.   Social Determinants of Health   Financial Resource Strain: Not on file  Food Insecurity: Not on file  Transportation Needs: Not on file  Physical Activity: Not on file  Stress: Not on file  Social Connections: Not on file                          Sleep: Good  Appetite:  Good  Current Medications: Current Facility-Administered Medications  Medication Dose Route Frequency Provider Last Rate Last Admin   acetaminophen (TYLENOL) tablet 650 mg  650 mg Oral Q6H PRN Oneta Rack,  NP   650 mg at 12/05/20 0800   alum & mag hydroxide-simeth (MAALOX/MYLANTA) 200-200-20 MG/5ML suspension 30 mL  30 mL Oral Q4H PRN Oneta Rack, NP       busPIRone (BUSPAR) tablet 5 mg  5 mg Oral BID Dahlia Byes C, NP   5 mg at 12/05/20 0754   doxycycline (VIBRA-TABS) tablet 100 mg  100 mg Oral BID Melbourne Abts W, PA-C   100 mg at 12/05/20 0753   gabapentin (NEURONTIN) capsule 300 mg  300 mg Oral TID Oneta Rack, NP   300 mg at 12/05/20 1112   hydrOXYzine (ATARAX/VISTARIL) tablet 25 mg  25 mg Oral TID PRN Dahlia Byes C, NP   25 mg at 12/04/20 2116   levETIRAcetam (KEPPRA) tablet 250  mg  250 mg Oral BID Oneta Rack, NP   250 mg at 12/05/20 0753   LORazepam (ATIVAN) tablet 1 mg  1 mg Oral BID Dahlia Byes C, NP   1 mg at 12/05/20 0753   Followed by   Melene Muller ON 12/06/2020] LORazepam (ATIVAN) tablet 1 mg  1 mg Oral Daily Onuoha, Josephine C, NP       magnesium hydroxide (MILK OF MAGNESIA) suspension 30 mL  30 mL Oral Daily PRN Oneta Rack, NP       neomycin-bacitracin-polymyxin (NEOSPORIN) ointment   Topical BID Comer Locket, MD   Given at 12/05/20 0754   nicotine (NICODERM CQ - dosed in mg/24 hours) patch 21 mg  21 mg Transdermal Daily Mason Jim, Amy E, MD   21 mg at 12/05/20 0753   QUEtiapine (SEROQUEL) tablet 125 mg  125 mg Oral QHS Lauro Franklin, MD   125 mg at 12/04/20 2116   QUEtiapine (SEROQUEL) tablet 25 mg  25 mg Oral Q1200 Lauro Franklin, MD   25 mg at 12/05/20 0753   temazepam (RESTORIL) capsule 7.5 mg  7.5 mg Oral QHS PRN Lauro Franklin, MD   7.5 mg at 12/04/20 2116    Lab Results: No results found for this or any previous visit (from the past 48 hour(s)).  Blood Alcohol level:  Lab Results  Component Value Date   ETH <10 12/01/2020   ETH  02/07/2010    <5        LOWEST DETECTABLE LIMIT FOR SERUM ALCOHOL IS 5 mg/dL FOR MEDICAL PURPOSES ONLY    Metabolic Disorder Labs: Lab Results  Component Value Date   HGBA1C 4.8  12/02/2020   MPG 91.06 12/02/2020   No results found for: PROLACTIN Lab Results  Component Value Date   CHOL 135 12/02/2020   TRIG 57 12/02/2020   HDL 55 12/02/2020   CHOLHDL 2.5 12/02/2020   VLDL 11 12/02/2020   LDLCALC 69 12/02/2020   LDLCALC  11/04/2009    53        Total Cholesterol/HDL:CHD Risk Coronary Heart Disease Risk Table                     Men   Women  1/2 Average Risk   3.4   3.3  Average Risk       5.0   4.4  2 X Average Risk   9.6   7.1  3 X Average Risk  23.4   11.0        Use the calculated Patient Ratio above and the CHD Risk Table to determine the patient's CHD Risk.        ATP III CLASSIFICATION (LDL):  <100     mg/dL   Optimal  546-568  mg/dL   Near or Above                    Optimal  130-159  mg/dL   Borderline  127-517  mg/dL   High  >001     mg/dL   Very High    Physical Findings: CIWA:  CIWA-Ar Total: 0   Musculoskeletal: Strength & Muscle Tone: within normal limits Gait & Station: normal Patient leans: N/A  Psychiatric Specialty Exam:  Presentation  General Appearance: Appropriate for Environment; Casual; Fairly Groomed  Eye Contact:Good  Speech:Clear and Coherent; Normal Rate  Speech Volume:Normal  Handedness:Right   Mood and Affect  Mood:Euthymic  Affect:Appropriate; Congruent   Thought Process  Thought Processes:Coherent; Goal Directed  Descriptions of Associations:Intact  Orientation:Full (Time, Place and Person)  Thought Content:Logical  History of Schizophrenia/Schizoaffective disorder:No  Duration of Psychotic Symptoms:No data recorded Hallucinations:Hallucinations: None  Ideas of Reference:None  Suicidal Thoughts:Suicidal Thoughts: No  Homicidal Thoughts:Homicidal Thoughts: No   Sensorium  Memory:Immediate Good; Recent Good  Judgment:Fair  Insight:Good   Executive Functions  Concentration:Good  Attention Span:Good  Recall:Good  Fund of  Knowledge:Good  Language:Good   Psychomotor Activity  Psychomotor Activity:Psychomotor Activity: Normal   Assets  Assets:Communication Skills; Desire for Improvement; Financial Resources/Insurance; Resilience   Sleep  Number of Hours of Sleep: 6.75    Physical Exam: Physical Exam Vitals and nursing note reviewed.  Constitutional:      General: She is not in acute distress.    Appearance: Normal appearance. She is normal weight. She is not ill-appearing or toxic-appearing.  HENT:     Head: Normocephalic and atraumatic.  Pulmonary:     Effort: Pulmonary effort is normal.  Musculoskeletal:        General: Normal range of motion.       Arms:     Comments: Erythematous and warm, minimal swelling No draining seen  Neurological:     General: No focal deficit present.     Mental Status: She is alert.   Review of Systems  Respiratory:  Negative for cough and shortness of breath.   Cardiovascular:  Negative for chest pain.  Gastrointestinal:  Negative for abdominal pain, constipation, diarrhea, nausea and vomiting.  Neurological:  Positive for headaches. Negative for weakness.  Psychiatric/Behavioral:  Negative for depression, hallucinations and suicidal ideas.   Blood pressure 105/65, pulse 76, temperature 98 F (36.7 C), temperature source Oral, resp. rate 16, height 5\' 6"  (1.676 m), weight 54.4 kg, SpO2 100 %, currently breastfeeding. Body mass index is 19.37 kg/m.   Treatment Plan Summary: Daily contact with patient to assess and evaluate symptoms and progress in treatment   Mrs. Harmon presents with a suicide attempt (cutting both wrists). PPHx is significant for Bipolar Disorder, MDD, Anxiety, and multiple suicide attempts.   Mood wise patient is doing good and is stable.  She is tolerating the Ativan taper well without any withdrawal signs.  Her sleep was a little disrupted last night so will increase her evening dose of Seroquel.  Her right wrist is warm  and erythematous but is not draining so we will start applying dry dressings.  She will continue with the doxycycline.  We will continue to monitor.   Bipolar Disorder: -Continue Seroquel 25 mg AM -INCREASE Seroquel from 125 mg to 150 mg QHS -Continue Gabapentin 300 mg TID   Generalized anxiety disorder: -Continue Buspar 5 mg BID -Continue Ativan Taper ends 10/5   Insomnia: -Continue Restoril 7.5 mg QHS -Continue to taper in the future    R Wrist Repair: -Continue Doxycycline  100 mg BID for 10 days (Day 1) -Start applying dry dressing   Nicotine Dependence: -Continue Nicotine Patch 21 mg daily   Seizure Disorder: -Continue Keppra 250 mg BID -Continue Seizure Precautions   -Continue Neosporin to both wrists BID -Continue PRN's: Tylenol, Maalox, Atarax, Milk of Magnesia, Trazodone   , MD 12/05/2020, 11:53 AM

## 2020-12-05 NOTE — Progress Notes (Signed)
Progress note  Pt found in bed; compliant with medication administration. Pt endorsed pain bilaterally in their wrists from their self inflicted injuries. Pt was provided wound care per MD verbal orders. Pt expressed hope with their discharge plan of [place]. Pt did express concern with finding a spouse and job with their four children. Pt stated they felt they may have been hallucinating last night with hearing dogs howl, but admits these may have been real. Pt was pleasant. Pt denies si/hi/ah/vh and verbally agrees to approach staff if these become apparent or before harming themselves/others while at [facility].  A: Pt provided support and encouragement. Pt given medication per protocol and standing orders. Q73m safety checks implemented and continued.  R: Pt safe on the unit. Will continue to monitor.

## 2020-12-05 NOTE — [facility] Group Notes (Signed)
[facility] Group Notes:  (Nursing/MHT/Case Management/Adjunct)  Date:  12/05/2020  Time:  6:46 PM  Type of Therapy:  Psychoeducational Skills  Participation Level:  Active  Participation Quality:  Appropriate  Affect:  Appropriate  Cognitive:  Appropriate  Insight:  Appropriate  Engagement in Group:  Engaged  Modes of Intervention:  Education  Summary of Progress/Problems: Educated patient on components of wellness. Provided patient with a work sheet on awareness and acceptance as two of the components of mindfulness  Gwenyth Allegra 12/05/2020, 6:46 PM

## 2020-12-05 NOTE — Group Note (Signed)
Recreation Therapy Group Note   Group Topic:Animal Assisted Therapy   Group Date: 12/05/2020 Start Time: 1430 End Time: 1515 Facilitators: Caroll Rancher, LRT/CTRS Location: [address]  AAA/T Program Assumption of Risk Form signed by Patient/ or Parent Legal Guardian Yes  Patient is free of allergies or severe asthma Yes  Patient reports no fear of animals Yes  Patient reports no history of cruelty to animals Yes  Patient understands his/her participation is voluntary Yes  Patient washes hands before animal contact Yes  Patient washes hands after animal contact Yes   Affect/Mood: Appropriate   Participation Level: Engaged   Participation Quality: Independent   Behavior: Appropriate   Speech/Thought Process: Focused   Insight: Good   Judgement: Good   Modes of Intervention: Tax adviser Therapy   Patient Response to Interventions:  Attentive and Engaged   Education Outcome:  Acknowledges education and In group clarification offered    Clinical Observations/Individualized Feedback:  Patient attended session and interacted appropriately with therapy dog and peers. Patient asked appropriate questions about therapy dog and his training. Patient shared stories about their pets at home with group.   Plan: Continue to engage patient in RT group sessions 2-3x/week.   Caroll Rancher, LRT/CTRS 12/05/2020 4:15 PM

## 2020-12-05 NOTE — Plan of Care (Signed)
  Problem: Education: °Goal: Knowledge of [place] [facility] Education information/materials will improve °Outcome: Progressing °  °Problem: Coping: °Goal: Coping ability will improve °Outcome: Progressing °  °Problem: Medication: °Goal: Compliance with prescribed medication regimen will improve °Outcome: Progressing °  °

## 2020-12-05 NOTE — Progress Notes (Signed)
Adult Psychoeducational Group Note  Date:  12/05/2020 Time:  10:43 PM  Group Topic/Focus:  Wrap-Up Group:   The focus of this group is to help patients review their daily goal of treatment and discuss progress on daily workbooks.  Participation Level:  Active  Participation Quality:  Appropriate  Affect:  Appropriate  Cognitive:  Appropriate  Insight: Appropriate  Engagement in Group:  Improving  Modes of Intervention:  Discussion  Additional Comments:  Pt stated her goal for today was to focus on her treatment plan and talk with her doctor about a medication issue. Pt stated she accomplished her goals today. Pt stated she talked with her doctor and her social worker about her care today. Pt rated her overall day a 9 out of 10. Pt stated she was able to contact her Parents, Husband, sister, and her kids today which improved her overall day. Pt stated she felt better about herself tonight. Pt stated she was able to attend all groups held today. Pt stated she was able to attend all meals. Pt stated she took all medications provided today. Pt stated her appetite was pretty good today. Pt rated her sleep last night was fair. Pt stated the goal tonight was to get some rest. Pt stated she had some physical pain tonight. Pt stated she had some severe pain in right lower arm. Pt rated the severe pain in her right lower arm a 8 on the pain level scale. Pt admitted to dealing with some visual hallucinations and auditory issues today. Pt stated she was not dealing with the issues tonight. Writer informed pt if these issues a rise tonight informed staff. Pt stated she would. Pt denies thoughts of harming herself or others. Pt stated she would alert staff if anything changed.  Felipa Furnace 12/05/2020, 10:43 PM

## 2020-12-06 DIAGNOSIS — F315 Bipolar disorder, current episode depressed, severe, with psychotic features: Secondary | ICD-10-CM

## 2020-12-06 MED ORDER — QUETIAPINE FUMARATE 50 MG PO TABS
150.0000 mg | ORAL_TABLET | Freq: Every day | ORAL | Status: DC
  Administered 2020-12-06: 150 mg via ORAL
  Filled 2020-12-06 (×3): qty 1

## 2020-12-06 MED ORDER — QUETIAPINE FUMARATE ER 50 MG PO TB24
50.0000 mg | ORAL_TABLET | Freq: Every day | ORAL | Status: DC
  Administered 2020-12-07 – 2020-12-08 (×2): 50 mg via ORAL
  Filled 2020-12-06 (×5): qty 1

## 2020-12-06 MED ORDER — LIDOCAINE 5 % EX PTCH
1.0000 | MEDICATED_PATCH | Freq: Every day | CUTANEOUS | Status: DC
  Administered 2020-12-06 – 2020-12-08 (×3): 1 via TRANSDERMAL
  Filled 2020-12-06 (×5): qty 1

## 2020-12-06 MED ORDER — QUETIAPINE FUMARATE 25 MG PO TABS
25.0000 mg | ORAL_TABLET | Freq: Once | ORAL | Status: AC
  Administered 2020-12-06: 25 mg via ORAL
  Filled 2020-12-06: qty 1

## 2020-12-06 NOTE — Progress Notes (Signed)
[facility] MD Progress Note  12/06/2020 3:14 PM Tonya Harmon  MRN:  [id number] Subjective:   Tonya Harmon is a 30 year old female with a past psychiatric history of bipolar disorder multiple suicide attempt, who was admitted to the psychiatric unit for safety and evaluation following suicide attempt by cutting both of her wrist, which required staples.      Psychiatric team made the following recommendations yesterday: -Continue Seroquel 25 mg AM -Increase Seroquel 125 mg to 150 mg qhs  -Continue Ativan taper (for xanax detox)  -Continue 7.5 mg QHS -Continue gabapentin 100 mg 3 times daily -Continue other medications including Keppra  Pt reports that her mood continues to improve. Reports less anhedonia. Reports that sleep is poor. We planned to increase seroquel last night, but there was error and this did not happen. She reports difficulty initiating and maintaining sleep. She reports feeling irritable around 3pm most days.  Pt is agreeable for increase seroquel tonight and also a 1 time dose of seroquel at noon today and changing morning seroquel from IR 25 mg to XR 50 mg, as this might help cover mood and irritbility through the afternoon.  Pt reports that pain and soreness is not worse of wounds. Pt has covered wounds b/l wrists today. Denies fever. Pt is taking antibiotic for wound infection - see note yesterday for recs.  Pt requests lidocaine patch for sciatic pain.  Denies having suicidal thoughts. Denies HI. Denies AVH.     Principal Problem: Bipolar affective disorder, depressed, severe, with psychotic behavior (HCC) Diagnosis: Principal Problem:   Bipolar affective disorder, depressed, severe, with psychotic behavior (HCC)  Total Time spent with patient: 20 minutes  Past Psychiatric History: see h and p   Past Medical History:  Past Medical History:  Diagnosis Date   Anxiety    Back pain    Bipolar 1 disorder (HCC)    Bipolar disorder (HCC)    Common  migraine with intractable migraine 06/08/2014   Contraceptive management 10/05/2015   Depression    Epilepsy (HCC)    GERD (gastroesophageal reflux disease)    pregnancy related-rolaids prn   Headache(784.0)    Insomnia    Nexplanon in place 10/04/2015   Pelvic pain in female 10/04/2015   PONV (postoperative nausea and vomiting)    Sciatica    Seizures (HCC)     Past Surgical History:  Procedure Laterality Date   CESAREAN SECTION  2011,2008   CESAREAN SECTION N/A 05/18/2012   Procedure: CESAREAN SECTION;  Surgeon: Tilda Burrow, MD;  Location: [facility];  Service: Obstetrics;  Laterality: N/A;   CESAREAN SECTION WITH BILATERAL TUBAL LIGATION Bilateral 08/02/2016   Procedure: CESAREAN SECTION WITH BILATERAL TUBAL LIGATION;  Surgeon: Tilda Burrow, MD;  Location: [facility];  Service: Obstetrics;  Laterality: Bilateral;   INCISION AND DRAINAGE ABSCESS Left 09/10/2013   Procedure: INCISION AND DRAINAGE ABSCESS, left breast/axilla;  Surgeon: Dalia Heading, MD;  Location: [facility];  Service: General;  Laterality: Left;   TUBAL LIGATION     Family History:  Family History  Problem Relation Age of Onset   Diabetes Mother    Fibromyalgia Mother    Rheum arthritis Mother    Diabetes Other    Hypertension Other    Cancer Other    Coronary artery disease Other    Tremor Maternal Grandmother    Cancer Maternal Grandmother        breast, uterine   Alzheimer's disease Maternal Grandmother    Stroke Maternal  Grandmother    Coronary artery disease Maternal Grandfather    Diabetes Maternal Grandfather    Hypertension Maternal Grandfather    Diabetes Paternal Grandmother    Hypertension Paternal Grandmother    Cancer Paternal Grandmother        breast   Cancer Paternal Grandfather        pancreatic   Hypertension Paternal Grandfather    Seizures Neg Hx    Family Psychiatric  History: see h and p  Social History:  Social History   Substance and Sexual Activity  Alcohol Use No      Social History   Substance and Sexual Activity  Drug Use No    Social History   Socioeconomic History   Marital status: Married    Spouse name: Not on file   Number of children: Not on file   Years of education: Not on file   Highest education level: Not on file  Occupational History   Not on file  Tobacco Use   Smoking status: Former    Types: Cigarettes    Passive exposure: Never   Smokeless tobacco: Never  Vaping Use   Vaping Use: Never used  Substance and Sexual Activity   Alcohol use: No   Drug use: No   Sexual activity: Not Currently    Birth control/protection: None  Other Topics Concern   Not on file  Social History Narrative   Patient is right handed.   Patient drinks 2 cups of caffeine daily.   Social Determinants of Health   Financial Resource Strain: Not on file  Food Insecurity: Not on file  Transportation Needs: Not on file  Physical Activity: Not on file  Stress: Not on file  Social Connections: Not on file                           Sleep: Poor  Appetite:  Good  Current Medications: Current Facility-Administered Medications  Medication Dose Route Frequency Provider Last Rate Last Admin   acetaminophen (TYLENOL) tablet 650 mg  650 mg Oral Q6H PRN Oneta Rack, NP   650 mg at 12/06/20 0637   alum & mag hydroxide-simeth (MAALOX/MYLANTA) 200-200-20 MG/5ML suspension 30 mL  30 mL Oral Q4H PRN Oneta Rack, NP       busPIRone (BUSPAR) tablet 5 mg  5 mg Oral BID Dahlia Byes C, NP   5 mg at 12/06/20 0819   doxycycline (VIBRA-TABS) tablet 100 mg  100 mg Oral BID Melbourne Abts W, PA-C   100 mg at 12/06/20 4166   gabapentin (NEURONTIN) capsule 300 mg  300 mg Oral TID Oneta Rack, NP   300 mg at 12/06/20 1211   hydrOXYzine (ATARAX/VISTARIL) tablet 25 mg  25 mg Oral TID PRN Dahlia Byes C, NP   25 mg at 12/06/20 0117   levETIRAcetam (KEPPRA) tablet 250 mg  250 mg Oral BID Oneta Rack, NP   250 mg at 12/06/20 0819    lidocaine (LIDODERM) 5 % 1 patch  1 patch Transdermal Daily Cade Dashner, MD   1 patch at 12/06/20 1212   magnesium hydroxide (MILK OF MAGNESIA) suspension 30 mL  30 mL Oral Daily PRN Oneta Rack, NP       neomycin-bacitracin-polymyxin (NEOSPORIN) ointment   Topical BID Comer Locket, MD   1 application at 12/06/20 0630   nicotine (NICODERM CQ - dosed in mg/24 hours) patch 21 mg  21 mg Transdermal Daily  Comer Locket, MD   21 mg at 12/06/20 0821   [START ON 12/07/2020] QUEtiapine (SEROQUEL XR) 24 hr tablet 50 mg  50 mg Oral Daily Jamila Slatten, MD       QUEtiapine (SEROQUEL) tablet 150 mg  150 mg Oral QHS Ralston Venus, MD       temazepam (RESTORIL) capsule 7.5 mg  7.5 mg Oral QHS PRN Lauro Franklin, MD   7.5 mg at 12/05/20 2114    Lab Results: No results found for this or any previous visit (from the past 48 hour(s)).  Blood Alcohol level:  Lab Results  Component Value Date   ETH <10 12/01/2020   ETH  02/07/2010    <5        LOWEST DETECTABLE LIMIT FOR SERUM ALCOHOL IS 5 mg/dL FOR MEDICAL PURPOSES ONLY    Metabolic Disorder Labs: Lab Results  Component Value Date   HGBA1C 4.8 12/02/2020   MPG 91.06 12/02/2020   No results found for: PROLACTIN Lab Results  Component Value Date   CHOL 135 12/02/2020   TRIG 57 12/02/2020   HDL 55 12/02/2020   CHOLHDL 2.5 12/02/2020   VLDL 11 12/02/2020   LDLCALC 69 12/02/2020   LDLCALC  11/04/2009    53        Total Cholesterol/HDL:CHD Risk Coronary Heart Disease Risk Table                     Men   Women  1/2 Average Risk   3.4   3.3  Average Risk       5.0   4.4  2 X Average Risk   9.6   7.1  3 X Average Risk  23.4   11.0        Use the calculated Patient Ratio above and the CHD Risk Table to determine the patient's CHD Risk.        ATP III CLASSIFICATION (LDL):  <100     mg/dL   Optimal  595-638  mg/dL   Near or Above                    Optimal  130-159  mg/dL   Borderline  756-433  mg/dL    High  >295     mg/dL   Very High    Physical Findings: AIMS: Facial and Oral Movements Muscles of Facial Expression: None, normal Lips and Perioral Area: None, normal Jaw: None, normal Tongue: None, normal,Extremity Movements Upper (arms, wrists, hands, fingers): None, normal Lower (legs, knees, ankles, toes): None, normal, Trunk Movements Neck, shoulders, hips: None, normal, Overall Severity Severity of abnormal movements (highest score from questions above): None, normal Incapacitation due to abnormal movements: None, normal Patient's awareness of abnormal movements (rate only patient's report): No Awareness, Dental Status Current problems with teeth and/or dentures?: No Does patient usually wear dentures?: No     Psychiatric Specialty Exam:  Presentation  General Appearance: Appropriate for Environment; Fairly Groomed  Eye Contact:Good  Speech:Clear and Coherent; Normal Rate  Speech Volume:Normal  Handedness:Right   Mood and Affect  Mood:Anxious  Affect:Congruent; Full Range   Thought Process  Thought Processes:Linear  Descriptions of Associations:Intact  Orientation:Full (Time, Place and Person)  Thought Content:Logical  History of Schizophrenia/Schizoaffective disorder:No  Duration of Psychotic Symptoms:No data recorded Hallucinations:Hallucinations: None Description of Auditory Hallucinations: heard dogs barking Description of Visual Hallucinations: saw dogs  Ideas of Reference:None  Suicidal Thoughts:Suicidal Thoughts: No  Homicidal Thoughts:Homicidal Thoughts: No  Sensorium  Memory:Immediate Good; Recent Good; Remote Good  Judgment:Fair  Insight:Fair   Executive Functions  Concentration:Fair  Attention Span:Fair  Recall:Good  Fund of Knowledge:Good  Language:Good   Psychomotor Activity  Psychomotor Activity:Psychomotor Activity: Normal   Assets  Assets:Communication Skills; Desire for Improvement; Financial  Resources/Insurance; Resilience   Sleep  Sleep:Sleep: Poor    Physical Exam: Physical Exam ROS Blood pressure 106/65, pulse 86, temperature 97.9 F (36.6 C), temperature source Oral, resp. rate 16, height 5\' 6"  (1.676 m), weight 54.4 kg, SpO2 100 %, currently breastfeeding. Body mass index is 19.37 kg/m.   Treatment Plan Summary: Daily contact with patient to assess and evaluate symptoms and progress in treatment, Medication management, and Plan    Mrs. Harmon presents with a suicide attempt (cutting both wrists). PPHx is significant for Bipolar Disorder, MDD, Anxiety, and multiple suicide attempts.     Bipolar Disorder: -INCREASE Seroquel from IR 25 mg qAM to XR 50 mg qam -One time dose of seroquel IR 25 mg once daily - now  -INCREASE Seroquel from 125 mg to 150 mg QHS -Continue Gabapentin 300 mg TID   Generalized anxiety disorder: -Continue Buspar 5 mg BID -DISCONTINUE ATIVAN TAPER    Insomnia: -Continue Restoril 7.5 mg QHS -Continue to taper in the future   R Wrist Repair: -Continue Doxycycline  100 mg BID for 10 days (Day 1) -Start applying dry dressing   Nicotine Dependence: -Continue Nicotine Patch 21 mg daily   Seizure Disorder: -Continue Keppra 250 mg BID -Continue Seizure Precautions -Continue Neosporin to both wrists BID   Total Time Spent in Direct Patient Care:  I personally spent 30 minutes on the unit in direct patient care. The direct patient care time included face-to-face time with the patient, reviewing the patient's chart, communicating with other professionals, and coordinating care. Greater than 50% of this time was spent in counseling or coordinating care with the patient regarding goals of hospitalization, psycho-education, and discharge planning needs.   , MD Psychiatrist    Phineas Inches Teal Bontrager, MD 12/06/2020, 3:14 PM

## 2020-12-06 NOTE — [facility] Group Notes (Signed)
Adult Psychoeducational Group Note  Date:  12/06/2020 Time:  5:23 PM  Group Topic/Focus:  Relaxation  Participation Level:  Did Not Attend   Tonya Harmon 12/06/2020, 5:23 PM

## 2020-12-06 NOTE — [facility] Group Notes (Signed)
[facility] Group Notes:  (Nursing/MHT/Case Management/Adjunct)  Date:  12/06/2020  Time:  8:34 PM  Type of Therapy:   NA Group  Participation Level:  Active  Participation Quality:  Appropriate  Affect:  Appropriate  Cognitive:  Appropriate  Insight:  Improving  Engagement in Group:  Developing/Improving  Modes of Intervention:  Discussion  Summary of Progress/Problems: PT did well in group and was very attentive. PT was very quiet during group bur was attentive and appropriate.   Lorita Officer 12/06/2020, 8:34 PM

## 2020-12-06 NOTE — Plan of Care (Addendum)
D: RN discussed patients auditory and visual hallucinations that are occurring at night. Patient states, I heard voices outside my window and I saw images crawling in the cubby hole. I know they are not real. I was told they were going to put me on something for that. " Patient encouraged to verbalize concerns to physicians during rounding.  Patient also discussed need an increase on Seroquel due to inability to sleep at night. Denies SI/HI. Rates depression as a 2 and anxiety as a 5/10. Endorses pain to R forearm as 8/10 and Left forearm 2/10. Dressings currently in place.  Will continue to monitor. Takes all medication as prescribed.  A: Patient supported emotionally and encouraged to verbalize concerns. See separate note for wound care concerns.   R: No adverse reaction to medication noted. Cont Q15 minute check for safety.  Problem: Education: Goal: Knowledge of [facility] Education information/materials will improve Outcome: Progressing Goal: Emotional status will improve Outcome: Progressing Goal: Mental status will improve Outcome: Progressing Goal: Verbalization of understanding the information provided will improve Outcome: Progressing   Problem: Safety: Goal: Periods of time without injury will increase Outcome: Progressing   Problem: Education: Goal: Ability to make informed decisions regarding treatment will improve Outcome: Progressing   Problem: Medication: Goal: Compliance with prescribed medication regimen will improve Outcome: Progressing   Problem: Self-Concept: Goal: Ability to disclose and discuss suicidal ideas will improve Outcome: Progressing Goal: Will verbalize positive feelings about self Outcome: Progressing   Problem: Medication: Goal: Compliance with prescribed medication regimen will improve Outcome: Progressing

## 2020-12-06 NOTE — [facility] Group Notes (Signed)
The focus of this group is to help patients establish daily goals to achieve during treatment and discuss how the patient can incorporate goal setting into their daily lives to aide in recovery.  Pt did not attend 

## 2020-12-06 NOTE — [facility] Group Notes (Signed)
Adult Psychoeducational Group Note  Date:  12/06/2020 Time:  4:36 PM  Group Topic/Focus:  Making Healthy Choices:   The focus of this group is to help patients identify negative/unhealthy choices they were using prior to admission and identify positive/healthier coping strategies to replace them upon discharge.  Modes of Intervention:  Education  Additional Comments:  Pt received group materials on personal development, choices and values. They were encouraged to fill it out with staff or independently during reflection time.   Deforest Hoyles Nariah Morgano 12/06/2020, 4:36 PM

## 2020-12-06 NOTE — Progress Notes (Signed)
Dressing change to bilateral forearm   Dressing change to bilateral forearm performed. Patient complain of pain and tenderness to touch to right forearm. Small amount of dried sanguinous  drainage noted to old dressing. Area is edematous, red and staples are bulging. MD notified of dressing. MD requested staff to keep wound dry and informed RN if patient becomes dehiscence or purulent drainage to send to ER. MD to place wound care orders. RN passed along to night shift nurses. Patient currently afebrile, on antibiotics and no dehiscence or purulent drainage noted to dressing. New dressing place with ointment per current order. Staff will continue to monitor.

## 2020-12-06 NOTE — Group Note (Signed)
[facility] LCSW Group Therapy Note   Group Date: 12/06/2020 Start Time: 1300 End Time: 1345   Type of Therapy/Topic:  Group Therapy:  Balance in Life  Participation Level:  Did Not Attend   Description of Group:    This group will address the concept of balance and how it feels and looks when one is unbalanced. Patients will be encouraged to process areas in their lives that are out of balance, and identify reasons for remaining unbalanced. Facilitators will guide patients utilizing problem- solving interventions to address and correct the stressor making their life unbalanced. Understanding and applying boundaries will be explored and addressed for obtaining  and maintaining a balanced life. Patients will be encouraged to explore ways to assertively make their unbalanced needs known to significant others in their lives, using other group members and facilitator for support and feedback.  Therapeutic Goals: Patient will identify two or more emotions or situations they have that consume much of in their lives. Patient will identify signs/triggers that life has become out of balance:  Patient will identify two ways to set boundaries in order to achieve balance in their lives:  Patient will demonstrate ability to communicate their needs through discussion and/or role plays   Therapeutic Modalities:   Cognitive Behavioral Therapy Solution-Focused Therapy Assertiveness Training   Tonya Harmon M Merleen Picazo, LCSWA 

## 2020-12-06 NOTE — Group Note (Signed)
Recreation Therapy Group Note   Group Topic:Healthy Decision Making  Group Date: 12/06/2020 Start Time: 0930 End Time: 1015 Facilitators: Caroll Rancher, LRT/CTRS Location: [address] Dayroom  Goal Area(s) Addresses:  Patient will effectively work with peer towards shared goal.  Patient will identify factors that guided their decision making.  Patient will pro-socially communicate ideas during group session.   Group Description: Patients were given a scenario that they were going to be stranded on a deserted [place] for several months before being rescued. Writer tasked them with making a list of 15 things they would choose to bring with them for "survival". The list of items was prioritized most important to least. Each patient would come up with their own list, then work together to create a new list of 15 items while in a group of 3-5 peers. LRT discussed each person's list and how it differed from others. The debrief included discussion of priorities, good decisions versus bad decisions, and how it is important to think before acting so we can make the best decision possible. LRT tied the concept of effective communication among group members to patient's support systems outside of the hospital and its benefit post discharge.   Affect/Mood: Appropriate   Participation Level: Engaged   Participation Quality: Independent   Behavior: Appropriate   Speech/Thought Process: Focused   Insight: Good   Judgement: Good   Modes of Intervention: Group work   Patient Response to Interventions:  Engaged   Education Outcome:  Acknowledges education and In group clarification offered    Clinical Observations/Individualized Feedback: Pt was bright, attentive and engaged during group.  Pt expressed she would take fishing gear, sw[employer] worker, hunting knife, boat with blankets and water proof matches on the deserted [place].  When group with peers, they came up with items such as water filter,  solar charged flashlight, planting supplies, metal cooking set, Newports and jugs for water.  Pt was appropriate and sarcastic saying she was going to need anxiety medicine after completing this activity.  Pt was also attentive during discussion.    Plan: Continue to engage patient in RT group sessions 2-3x/week.   Caroll Rancher, LRT/CTRS 12/06/2020 12:17 PM

## 2020-12-07 DIAGNOSIS — F315 Bipolar disorder, current episode depressed, severe, with psychotic features: Principal | ICD-10-CM

## 2020-12-07 MED ORDER — QUETIAPINE FUMARATE ER 200 MG PO TB24
200.0000 mg | ORAL_TABLET | Freq: Every day | ORAL | Status: DC
  Administered 2020-12-07: 200 mg via ORAL
  Filled 2020-12-07 (×4): qty 1

## 2020-12-07 NOTE — Progress Notes (Signed)
Bilateral wrists dressing change was completed.   No redness or drainage noted.   Pt washed her wrists gently with soap and water prior to dressing change.   Non stick dry dressing placed with paper tape over bilateral wrists. Pt tolerated dressing change well.   Pt reports today her right wrist was noticeably not swollen, and not read, whereas yesterday it was painful, red, and swollen yesterday. Pt reports she noticed an overall improvement from yesterday in the condition of her right wrist wound.   Staples noted to be intact on bilateral wrists.   Will continue to monitor for signs and symptoms of infection.

## 2020-12-07 NOTE — [facility] Group Notes (Signed)
Adult Psychoeducational Group Note  Date:  12/07/2020 Time:  4:40 PM  Group Topic/Focus:  Managing Feelings:   The focus of this group is to identify what feelings patients have difficulty handling and develop a plan to handle them in a healthier way upon discharge.  Participation Level:  Active  Participation Quality:  Appropriate  Affect:  Appropriate  Cognitive:  Alert  Insight: Appropriate  Engagement in Group:  Engaged  Modes of Intervention:  Discussion  Additional Comments:  Patient attended and participated in the Psycho-Ed group.  Jearl Klinefelter 12/07/2020, 4:40 PM

## 2020-12-07 NOTE — Progress Notes (Signed)
[facility] Group Notes:  (Nursing/MHT/Case Management/Adjunct)  Date:  12/07/2020  Time:  2000  Type of Therapy:   wrap up group  Participation Level:  Active  Participation Quality:  Appropriate, Attentive, Sharing, and Supportive  Affect:  Appropriate  Cognitive:  Alert  Insight:  Improving  Engagement in Group:  Engaged  Modes of Intervention:  Clarification, Education, and Support  Summary of Progress/Problems: Positive thinking and positive change were discussed.   Marcille Buffy 12/07/2020, 8:58 PM

## 2020-12-07 NOTE — Progress Notes (Signed)
Pt has been alert and oriented to person, place, time and situation. Pt is very pleasant, calm, cooperative, social with staff and peers, is soft spoken. Pt talked with her mother on the phone. Pt talked about her plans to move away from her husband and start new with her parents. PT denies SI/HI/AVH, denies feelings of depression and anxiety. Pt makes good eye contact, affect is appropriately bright. Will continue to monitor pt per Q15 minute face checks and monitor for safety and progress.

## 2020-12-07 NOTE — Progress Notes (Signed)
Pt stated she was doing better, pt visible on the unit this evening. Pt given PRN Vistaril with HS medication    12/07/20 2000  Psych Admission Type (Psych Patients Only)  Admission Status Voluntary  Psychosocial Assessment  Patient Complaints None  Eye Contact Avertive  Facial Expression Animated;Anxious  Affect Appropriate to circumstance  Speech Logical/coherent  Interaction Assertive  Motor Activity Other (Comment) (Steady pace, WNL)  Appearance/Hygiene Unremarkable  Behavior Characteristics Cooperative;Calm  Mood Pleasant  Thought Process  Coherency WDL  Content WDL  Delusions WDL  Perception WDL  Hallucination None reported or observed  Judgment Limited  Confusion None  Danger to Self  Current suicidal ideation? Denies  Danger to Others  Danger to Others None reported or observed

## 2020-12-07 NOTE — [facility] Group Notes (Signed)
Did not attend goals group 

## 2020-12-07 NOTE — Progress Notes (Addendum)
[facility] MD Progress Note  12/07/2020 2:01 PM Tonya Harmon  MRN:  [id number] Subjective:   Tonya Harmon is a 30 year old female with a past psychiatric history of bipolar disorder multiple suicide attempt, who was admitted to the psychiatric unit for safety and evaluation following suicide attempt by cutting both of her wrist, which required staples.      Psychiatric team made the following recommendations yesterday: -Increase Seroquel ER from 25 mg to XR 50 mg AM -Continue Seroquel 150 mg qhs  -Continue Ativan taper (for xanax detox)  -Continue Restoril 7.5 mg QHS -Continue gabapentin 100 mg 3 times daily -Continue other medications including Keppra  Case was discussed in the multidisciplinary team. MAR was reviewed and patient was compliant with medications.    On interview today, patient reports that her mood has been stable and much improved since admission.  She reports that slept remains some better, but still awakening around 130am.  She reports that she is eating and drinking well.  She reports no SI, HI, or AVH.  She reports that she has tolerated the Ativan taper well.  She reports that even though her sleep has been improving she still woke up around 130 AM this morning and could not go to sleep.  She reports that even though this happened she was not irritable and simply read her book until morning.  Discussed with her that rebound insomnia can happen after an Ativan taper.  When asked what the difficulty with sleep was she reports that she goes to sleep fairly easily in fact stating that her restless leg only bothered her for 10 minutes and then she is able to fall asleep.  She reports her issue is that she wakes up in the middle of the night.  Discussed with her that we could change her Seroquel from immediate release to extended release to hopefully provide a more steady dose throughout the night as well as increasing the dose further.  She was agreeable to  this.    She reports having a headache today but that she was given Tylenol approximately 30 minutes before interview.  She reports that while her right wrist is still painful the swelling has gone down and the pain has also increased.  She reports no other concerns at present.    Principal Problem: Bipolar affective disorder, depressed, severe, with psychotic behavior (HCC) Diagnosis: Principal Problem:   Bipolar affective disorder, depressed, severe, with psychotic behavior (HCC)  Total Time spent with patient: 20 minutes  Past Psychiatric History: see h and p   Past Medical History:  Past Medical History:  Diagnosis Date   Anxiety    Back pain    Bipolar 1 disorder (HCC)    Bipolar disorder (HCC)    Common migraine with intractable migraine 06/08/2014   Contraceptive management 10/05/2015   Depression    Epilepsy (HCC)    GERD (gastroesophageal reflux disease)    pregnancy related-rolaids prn   Headache(784.0)    Insomnia    Nexplanon in place 10/04/2015   Pelvic pain in female 10/04/2015   PONV (postoperative nausea and vomiting)    Sciatica    Seizures (HCC)     Past Surgical History:  Procedure Laterality Date   CESAREAN SECTION  2011,2008   CESAREAN SECTION N/A 05/18/2012   Procedure: CESAREAN SECTION;  Surgeon: Tilda Burrow, MD;  Location: [facility];  Service: Obstetrics;  Laterality: N/A;   CESAREAN SECTION WITH BILATERAL TUBAL LIGATION Bilateral 08/02/2016   Procedure: CESAREAN SECTION  WITH BILATERAL TUBAL LIGATION;  Surgeon: Tilda Burrow, MD;  Location: [facility];  Service: Obstetrics;  Laterality: Bilateral;   INCISION AND DRAINAGE ABSCESS Left 09/10/2013   Procedure: INCISION AND DRAINAGE ABSCESS, left breast/axilla;  Surgeon: Dalia Heading, MD;  Location: [facility];  Service: General;  Laterality: Left;   TUBAL LIGATION     Family History:  Family History  Problem Relation Age of Onset   Diabetes Mother    Fibromyalgia Mother    Rheum arthritis Mother     Diabetes Other    Hypertension Other    Cancer Other    Coronary artery disease Other    Tremor Maternal Grandmother    Cancer Maternal Grandmother        breast, uterine   Alzheimer's disease Maternal Grandmother    Stroke Maternal Grandmother    Coronary artery disease Maternal Grandfather    Diabetes Maternal Grandfather    Hypertension Maternal Grandfather    Diabetes Paternal Grandmother    Hypertension Paternal Grandmother    Cancer Paternal Grandmother        breast   Cancer Paternal Grandfather        pancreatic   Hypertension Paternal Grandfather    Seizures Neg Hx    Family Psychiatric  History: Mother- Bipolar Disorder Half Sister- Bipolar Disorder Half Brother- Bipolar Disorder Father- Depression Half Brother- Depression and Anxiety  Social History:  Social History   Substance and Sexual Activity  Alcohol Use No     Social History   Substance and Sexual Activity  Drug Use No    Social History   Socioeconomic History   Marital status: Married    Spouse name: Not on file   Number of children: Not on file   Years of education: Not on file   Highest education level: Not on file  Occupational History   Not on file  Tobacco Use   Smoking status: Former    Types: Cigarettes    Passive exposure: Never   Smokeless tobacco: Never  Vaping Use   Vaping Use: Never used  Substance and Sexual Activity   Alcohol use: No   Drug use: No   Sexual activity: Not Currently    Birth control/protection: None  Other Topics Concern   Not on file  Social History Narrative   Patient is right handed.   Patient drinks 2 cups of caffeine daily.   Social Determinants of Health   Financial Resource Strain: Not on file  Food Insecurity: Not on file  Transportation Needs: Not on file  Physical Activity: Not on file  Stress: Not on file  Social Connections: Not on file                           Sleep: Fair  Appetite:  Good  Current  Medications: Current Facility-Administered Medications  Medication Dose Route Frequency Provider Last Rate Last Admin   acetaminophen (TYLENOL) tablet 650 mg  650 mg Oral Q6H PRN Oneta Rack, NP   650 mg at 12/07/20 1052   alum & mag hydroxide-simeth (MAALOX/MYLANTA) 200-200-20 MG/5ML suspension 30 mL  30 mL Oral Q4H PRN Oneta Rack, NP       busPIRone (BUSPAR) tablet 5 mg  5 mg Oral BID Dahlia Byes C, NP   5 mg at 12/07/20 0745   doxycycline (VIBRA-TABS) tablet 100 mg  100 mg Oral BID Melbourne Abts W, PA-C   100 mg at 12/07/20  0745   gabapentin (NEURONTIN) capsule 300 mg  300 mg Oral TID Oneta Rack, NP   300 mg at 12/07/20 0746   hydrOXYzine (ATARAX/VISTARIL) tablet 25 mg  25 mg Oral TID PRN Dahlia Byes C, NP   25 mg at 12/07/20 0125   levETIRAcetam (KEPPRA) tablet 250 mg  250 mg Oral BID Oneta Rack, NP   250 mg at 12/07/20 0746   lidocaine (LIDODERM) 5 % 1 patch  1 patch Transdermal Daily Aela Bohan, Harrold Donath, MD   1 patch at 12/07/20 0749   magnesium hydroxide (MILK OF MAGNESIA) suspension 30 mL  30 mL Oral Daily PRN Oneta Rack, NP       neomycin-bacitracin-polymyxin (NEOSPORIN) ointment   Topical BID Comer Locket, MD   2 application at 12/07/20 0746   nicotine (NICODERM CQ - dosed in mg/24 hours) patch 21 mg  21 mg Transdermal Daily Mason Jim, Amy E, MD   21 mg at 12/07/20 5465   QUEtiapine (SEROQUEL XR) 24 hr tablet 200 mg  200 mg Oral QHS Lauro Franklin, MD       QUEtiapine (SEROQUEL XR) 24 hr tablet 50 mg  50 mg Oral Daily Felishia Wartman, Harrold Donath, MD   50 mg at 12/07/20 0748   temazepam (RESTORIL) capsule 7.5 mg  7.5 mg Oral QHS PRN Lauro Franklin, MD   7.5 mg at 12/06/20 2112    Lab Results: No results found for this or any previous visit (from the past 48 hour(s)).  Blood Alcohol level:  Lab Results  Component Value Date   ETH <10 12/01/2020   ETH  02/07/2010    <5        LOWEST DETECTABLE LIMIT FOR SERUM ALCOHOL IS 5 mg/dL FOR  MEDICAL PURPOSES ONLY    Metabolic Disorder Labs: Lab Results  Component Value Date   HGBA1C 4.8 12/02/2020   MPG 91.06 12/02/2020   No results found for: PROLACTIN Lab Results  Component Value Date   CHOL 135 12/02/2020   TRIG 57 12/02/2020   HDL 55 12/02/2020   CHOLHDL 2.5 12/02/2020   VLDL 11 12/02/2020   LDLCALC 69 12/02/2020   LDLCALC  11/04/2009    53        Total Cholesterol/HDL:CHD Risk Coronary Heart Disease Risk Table                     Men   Women  1/2 Average Risk   3.4   3.3  Average Risk       5.0   4.4  2 X Average Risk   9.6   7.1  3 X Average Risk  23.4   11.0        Use the calculated Patient Ratio above and the CHD Risk Table to determine the patient's CHD Risk.        ATP III CLASSIFICATION (LDL):  <100     mg/dL   Optimal  681-275  mg/dL   Near or Above                    Optimal  130-159  mg/dL   Borderline  170-017  mg/dL   High  >494     mg/dL   Very High    Physical Findings: AIMS: Facial and Oral Movements Muscles of Facial Expression: None, normal Lips and Perioral Area: None, normal Jaw: None, normal Tongue: None, normal,Extremity Movements Upper (arms, wrists, hands, fingers): None, normal Lower (legs, knees, ankles,  toes): None, normal, Trunk Movements Neck, shoulders, hips: None, normal, Overall Severity Severity of abnormal movements (highest score from questions above): None, normal Incapacitation due to abnormal movements: None, normal Patient's awareness of abnormal movements (rate only patient's report): No Awareness, Dental Status Current problems with teeth and/or dentures?: No Does patient usually wear dentures?: No     Psychiatric Specialty Exam:  Presentation  General Appearance: Appropriate for Environment; Casual; Fairly Groomed  Eye Contact:Good  Speech:Clear and Coherent; Normal Rate  Speech Volume:Normal  Handedness:Right   Mood and Affect  Mood:Euthymic  Affect:Appropriate;  Congruent   Thought Process  Thought Processes:Coherent  Descriptions of Associations:Intact  Orientation:Full (Time, Place and Person)  Thought Content:Logical  History of Schizophrenia/Schizoaffective disorder:No  Duration of Psychotic Symptoms:No data recorded Hallucinations:Hallucinations: None  Ideas of Reference:None  Suicidal Thoughts:Suicidal Thoughts: No  Homicidal Thoughts:Homicidal Thoughts: No   Sensorium  Memory:Immediate Good; Recent Good  Judgment:Fair  Insight:Fair   Executive Functions  Concentration:Good  Attention Span:Good  Recall:Good  Fund of Knowledge:Good  Language:Good   Psychomotor Activity  Psychomotor Activity:Psychomotor Activity: Normal   Assets  Assets:Communication Skills; Desire for Improvement; Financial Resources/Insurance; Resilience; Social Support   Sleep  Sleep:Sleep: Good Number of Hours of Sleep: 6.5    Physical Exam: Physical Exam Vitals and nursing note reviewed.  Constitutional:      Appearance: Normal appearance. She is normal weight.  HENT:     Head: Normocephalic and atraumatic.  Pulmonary:     Effort: Pulmonary effort is normal.  Musculoskeletal:        General: Normal range of motion.  Neurological:     General: No focal deficit present.     Mental Status: She is alert.   Review of Systems  Respiratory:  Negative for cough and shortness of breath.   Cardiovascular:  Negative for chest pain.  Gastrointestinal:  Negative for abdominal pain, constipation, diarrhea, nausea and vomiting.  Neurological:  Positive for headaches. Negative for weakness.  Psychiatric/Behavioral:  Negative for depression, hallucinations and suicidal ideas. The patient is not nervous/anxious.   Blood pressure (!) 107/58, pulse 96, temperature 98.6 F (37 C), temperature source Oral, resp. rate 16, height 5\' 6"  (1.676 m), weight 54.4 kg, SpO2 100 %, currently breastfeeding. Body mass index is 19.37  kg/m.   Treatment Plan Summary: Daily contact with patient to assess and evaluate symptoms and progress in treatment, Medication management, and Plan    Mrs. Harmon presents with a suicide attempt (cutting both wrists). PPHx is significant for Bipolar Disorder, MDD, Anxiety, and multiple suicide attempts.   She is continuing to stay stable as far as her mood.  She continues to have disruptions in her sleep so will increase her evening dose of Seroquel but since she wakes up in the middle of the night will change it to XR Seroquel to hopefully have uninterrupted sleep.  Current goal is to discharge tomorrow to her parents so she can stay with them for a few days while her husband moves out of their house.  We will continue to monitor.   Ordered an EKG to monitor her QTC was found to be 434 WNL.     Bipolar Disorder: -Continue Seroquel XR 50 mg AM -INCREASE from Seroquel 150 mg to Seroquel XR 200 QHS -Continue Gabapentin 300 mg TID    Generalized anxiety disorder: -Continue Buspar 5 mg BID -Ativan taper completed     Insomnia: -Continue Restoril 7.5 mg QHS   R Wrist Repair: -Continue Doxycycline  100 mg BID  for 10 days (Day 3) -Continue applying dry dressing    Nicotine Dependence: -Continue Nicotine Patch 21 mg daily    Seizure Disorder: -Continue Keppra 250 mg BID -Continue Seizure Precautions   -Continue Neosporin to both wrists BID    Lauro Franklin, MD 12/07/2020, 2:01 PM

## 2020-12-07 NOTE — [facility] Group Notes (Signed)
Adult Psychoeducational Group Note  Date:  12/07/2020 Time:  2:59 PM  Group Topic/Focus:  Developing a Wellness Toolbox:   The focus of this group is to help patients develop a "wellness toolbox" with skills and strategies to promote recovery upon discharge.  Participation Level:  Active  Participation Quality:  Attentive  Affect:  Appropriate  Cognitive:  Alert  Insight: Appropriate  Engagement in Group:  Engaged  Modes of Intervention:  Activity  Additional Comments:  Patient attended and participated in the progressive muscle relaxation activity.  Jearl Klinefelter 12/07/2020, 2:59 PM

## 2020-12-08 ENCOUNTER — Encounter (HOSPITAL_COMMUNITY): Payer: Self-pay

## 2020-12-08 DIAGNOSIS — F315 Bipolar disorder, current episode depressed, severe, with psychotic features: Secondary | ICD-10-CM | POA: Diagnosis not present

## 2020-12-08 MED ORDER — QUETIAPINE FUMARATE ER 50 MG PO TB24: 50.0000 mg | ORAL_TABLET | Freq: Every day | ORAL | 0 refills | Status: AC

## 2020-12-08 MED ORDER — NICOTINE 21 MG/24HR TD PT24: 21.0000 mg | MEDICATED_PATCH | Freq: Every day | TRANSDERMAL | 0 refills | Status: AC

## 2020-12-08 MED ORDER — DOXYCYCLINE HYCLATE 100 MG PO TABS: 100.0000 mg | ORAL_TABLET | Freq: Two times a day (BID) | ORAL | 0 refills | Status: AC

## 2020-12-08 MED ORDER — BACITRACIN-NEOMYCIN-POLYMYXIN OINTMENT TUBE: 1.0000 "application " | TOPICAL_OINTMENT | Freq: Two times a day (BID) | CUTANEOUS | 0 refills | Status: AC

## 2020-12-08 MED ORDER — BUSPIRONE HCL 5 MG PO TABS: 5.0000 mg | ORAL_TABLET | Freq: Two times a day (BID) | ORAL | 0 refills | Status: AC

## 2020-12-08 MED ORDER — QUETIAPINE FUMARATE ER 200 MG PO TB24: 200.0000 mg | ORAL_TABLET | Freq: Every day | ORAL | 0 refills | Status: AC

## 2020-12-08 MED ORDER — HYDROXYZINE HCL 25 MG PO TABS: 25.0000 mg | ORAL_TABLET | Freq: Three times a day (TID) | ORAL | 0 refills | Status: AC | PRN

## 2020-12-08 MED ORDER — LIDOCAINE 5 % EX PTCH: 1.0000 | MEDICATED_PATCH | Freq: Every day | CUTANEOUS | 0 refills | Status: AC

## 2020-12-08 MED ORDER — TEMAZEPAM 7.5 MG PO CAPS: 7.5000 mg | ORAL_CAPSULE | Freq: Every evening | ORAL | 0 refills | Status: AC | PRN

## 2020-12-08 NOTE — BH IP Treatment Plan (Signed)
Interdisciplinary Treatment and Diagnostic Plan Update  12/08/2020 Time of Session:  Tonya Harmon [facility] MRN: [id number]  Principal Diagnosis: Bipolar affective disorder, depressed, severe, with psychotic behavior (HCC)  Secondary Diagnoses: Principal Problem:   Bipolar affective disorder, depressed, severe, with psychotic behavior (HCC)   Current Medications:  Current Facility-Administered Medications  Medication Dose Route Frequency Provider Last Rate Last Admin   acetaminophen (TYLENOL) tablet 650 mg  650 mg Oral Q6H PRN Oneta Rack, NP   650 mg at 12/07/20 2309   alum & mag hydroxide-simeth (MAALOX/MYLANTA) 200-200-20 MG/5ML suspension 30 mL  30 mL Oral Q4H PRN Oneta Rack, NP       busPIRone (BUSPAR) tablet 5 mg  5 mg Oral BID Dahlia Byes C, NP   5 mg at 12/08/20 0816   doxycycline (VIBRA-TABS) tablet 100 mg  100 mg Oral BID Melbourne Abts W, PA-C   100 mg at 12/08/20 0539   gabapentin (NEURONTIN) capsule 300 mg  300 mg Oral TID Oneta Rack, NP   300 mg at 12/08/20 0815   hydrOXYzine (ATARAX/VISTARIL) tablet 25 mg  25 mg Oral TID PRN Dahlia Byes C, NP   25 mg at 12/07/20 2110   levETIRAcetam (KEPPRA) tablet 250 mg  250 mg Oral BID Oneta Rack, NP   250 mg at 12/08/20 0815   lidocaine (LIDODERM) 5 % 1 patch  1 patch Transdermal Daily Massengill, Harrold Donath, MD   1 patch at 12/08/20 0816   magnesium hydroxide (MILK OF MAGNESIA) suspension 30 mL  30 mL Oral Daily PRN Oneta Rack, NP       neomycin-bacitracin-polymyxin (NEOSPORIN) ointment   Topical BID Comer Locket, MD   Given at 12/08/20 0816   nicotine (NICODERM CQ - dosed in mg/24 hours) patch 21 mg  21 mg Transdermal Daily Mason Jim, Amy E, MD   21 mg at 12/08/20 7673   QUEtiapine (SEROQUEL XR) 24 hr tablet 200 mg  200 mg Oral QHS Lauro Franklin, MD   200 mg at 12/07/20 2110   QUEtiapine (SEROQUEL XR) 24 hr tablet 50 mg  50 mg Oral Daily Massengill, Harrold Donath, MD   50 mg at 12/08/20 0815    temazepam (RESTORIL) capsule 7.5 mg  7.5 mg Oral QHS PRN Lauro Franklin, MD   7.5 mg at 12/07/20 2110   PTA Medications: Medications Prior to Admission  Medication Sig Dispense Refill Last Dose   acetaminophen (TYLENOL) 500 MG tablet Take 500 mg by mouth every 6 (six) hours as needed.      ALPRAZolam (XANAX) 0.5 MG tablet Take 0.5 mg by mouth 3 (three) times daily as needed.      docusate sodium (COLACE) 100 MG capsule Take 1 capsule (100 mg total) by mouth 2 (two) times daily as needed. (Patient not taking: Reported on 12/01/2020) 30 capsule 2    doxycycline (VIBRAMYCIN) 100 MG capsule Take 1 capsule (100 mg total) by mouth 2 (two) times daily. (Patient not taking: Reported on 12/01/2020) 20 capsule 0    gabapentin (NEURONTIN) 300 MG capsule Take 1 capsule (300 mg total) by mouth 3 (three) times daily. 90 capsule 0    HYDROcodone-acetaminophen (NORCO/VICODIN) 5-325 MG tablet Take 1 tablet by mouth every 6 (six) hours as needed. (Patient not taking: No sig reported) 30 tablet 0    ibuprofen (ADVIL,MOTRIN) 200 MG tablet Take 200 mg by mouth every 6 (six) hours as needed.      ibuprofen (ADVIL,MOTRIN) 600 MG tablet Take 1 tablet (  600 mg total) by mouth 4 (four) times daily. (Patient not taking: Reported on 12/01/2020) 30 tablet 0    levETIRAcetam (KEPPRA) 250 MG tablet Take 250 mg by mouth 2 (two) times daily.      Prenat w/o A-[id number] (CITRANATAL ASSURE) 35-1 & 300 MG tablet One tablet and one capsule daily (Patient not taking: No sig reported) 60 tablet 11    temazepam (RESTORIL) 30 MG capsule Take 2 capsules by mouth at bedtime.       Patient Stressors: Marital or family conflict    Patient Strengths: Ability for Contractor for treatment/growth  Supportive family/friends   Treatment Modalities: Medication Management, Group therapy, Case management,  1 to 1 session with clinician, Psychoeducation, Recreational therapy.   Physician Treatment  Plan for Primary Diagnosis: Bipolar affective disorder, depressed, severe, with psychotic behavior (HCC) Long Term Goal(s): Improvement in symptoms so as ready for discharge   Short Term Goals: Ability to identify changes in lifestyle to reduce recurrence of condition will improve Ability to verbalize feelings will improve Ability to disclose and discuss suicidal ideas Ability to demonstrate self-control will improve Ability to identify and develop effective coping behaviors will improve Ability to maintain clinical measurements within normal limits will improve Compliance with prescribed medications will improve Ability to identify triggers associated with substance abuse/mental health issues will improve  Medication Management: Evaluate patient's response, side effects, and tolerance of medication regimen.  Therapeutic Interventions: 1 to 1 sessions, Unit Group sessions and Medication administration.  Evaluation of Outcomes: Adequate for Discharge  Physician Treatment Plan for Secondary Diagnosis: Principal Problem:   Bipolar affective disorder, depressed, severe, with psychotic behavior (HCC)  Long Term Goal(s): Improvement in symptoms so as ready for discharge   Short Term Goals: Ability to identify changes in lifestyle to reduce recurrence of condition will improve Ability to verbalize feelings will improve Ability to disclose and discuss suicidal ideas Ability to demonstrate self-control will improve Ability to identify and develop effective coping behaviors will improve Ability to maintain clinical measurements within normal limits will improve Compliance with prescribed medications will improve Ability to identify triggers associated with substance abuse/mental health issues will improve     Medication Management: Evaluate patient's response, side effects, and tolerance of medication regimen.  Therapeutic Interventions: 1 to 1 sessions, Unit Group sessions and Medication  administration.  Evaluation of Outcomes: Adequate for Discharge   RN Treatment Plan for Primary Diagnosis: Bipolar affective disorder, depressed, severe, with psychotic behavior (HCC) Long Term Goal(s): Knowledge of disease and therapeutic regimen to maintain health will improve  Short Term Goals: Ability to participate in decision making will improve, Ability to verbalize feelings will improve, and Ability to disclose and discuss suicidal ideas  Medication Management: RN will administer medications as ordered by provider, will assess and evaluate patient's response and provide education to patient for prescribed medication. RN will report any adverse and/or side effects to prescribing provider.  Therapeutic Interventions: 1 on 1 counseling sessions, Psychoeducation, Medication administration, Evaluate responses to treatment, Monitor vital signs and CBGs as ordered, Perform/monitor CIWA, COWS, AIMS and Fall Risk screenings as ordered, Perform wound care treatments as ordered.  Evaluation of Outcomes: Adequate for Discharge   LCSW Treatment Plan for Primary Diagnosis: Bipolar affective disorder, depressed, severe, with psychotic behavior (HCC) Long Term Goal(s): Safe transition to appropriate next level of care at discharge, Engage patient in therapeutic group addressing interpersonal concerns.  Short Term Goals: Engage patient in aftercare planning with referrals and resources,  Increase social support, and Increase ability to appropriately verbalize feelings  Therapeutic Interventions: Assess for all discharge needs, 1 to 1 time with Social worker, Explore available resources and support systems, Assess for adequacy in community support network, Educate family and significant other(s) on suicide prevention, Complete Psychosocial Assessment, Interpersonal group therapy.  Evaluation of Outcomes: Adequate for Discharge   Progress in Treatment: Attending groups: Yes. Participating in groups:  Yes. Taking medication as prescribed: Yes. Toleration medication: Yes. Family/Significant other contact made: Yes, individual(s) contacted:  mother Patient understands diagnosis: Yes. Discussing patient identified problems/goals with staff: Yes. Medical problems stabilized or resolved: Yes. Denies suicidal/homicidal ideation: Yes. Issues/concerns per patient self-inventory: No. Other: None  New problem(s) identified: No, Describe:  None  New Short Term/Long Term Goal(s):medication stabilization, elimination of SI thoughts, development of comprehensive mental wellness plan.   Patient Goals:  "to learn better coping skills with depression and during a crisis."  Discharge Plan or Barriers: Pt was connect to [facility] for medication management and therapy   Reason for Continuation of Hospitalization: Medication stabilization  Estimated Length of Stay: 3-5 days   Scribe for Treatment Team: Chrys Racer 12/08/2020 9:39 AM

## 2020-12-08 NOTE — Progress Notes (Signed)
  [facility] Adult Case Management Discharge Plan :  Will you be returning to the same living situation after discharge:  No. Will be staying with mother At discharge, do you have transportation home?: Yes,  parents to pick up Do you have the ability to pay for your medications: Yes,  has insurance  Release of information consent forms completed and in the chart;  Patient's signature needed at discharge.  Patient to Follow up at:  Follow-up Information     Services, [facility]. Go on 12/11/2020.   Why: You have a hospital follow up appointment for therapy and medication management services on 12/11/20 at 10:00 am.  This appointment will be held in person. Contact information: [address] [place] [address] [phone]         [facility]. Call.   Why: Call to schedule an appointment to monitor and assist with medical needs. Contact information: [address] [place] [address] [phone]                Next level of care provider has access to [facility] Link:no  Safety Planning and Suicide Prevention discussed: Yes,  with mother     Has patient been referred to the Quitline?: Patient refused referral  Patient has been referred for addiction treatment: Pt. refused referral  Otelia Santee, LCSW 12/08/2020, 9:52 AM

## 2020-12-08 NOTE — [facility] Suicide Risk Assessment (Signed)
[facility] Discharge Suicide Risk Assessment   Principal Problem: Bipolar affective disorder, depressed, severe, with psychotic behavior (HCC) Discharge Diagnoses: Principal Problem:   Bipolar affective disorder, depressed, severe, with psychotic behavior (HCC)   Total Time spent with patient: 20 minutes  Patient is a 30 year old female with a psychiatric history of bipolar disorder as well as anxiety disorder, who was admitted to the psychiatric unit for safety, evaluation and treatment after suicide attempt by cutting wrist, which required staples, bilateral, after finding out that husband was cheating on her.  During hospitalization, medication changes were made: Xanax was converted to Ativan, and tapered; Restoril was decreased, Seroquel was started and increased to current dose, BuSpar was started, Keppra was continued, gabapentin was started.  Patient tolerated medication changes well, reports improvement in depressive symptoms, less overwhelmed, and improved mood stability.  Sleep improved.  Patient denied having suicidal thoughts for the more than 48 hours prior to discharge.  Patient will be picked up by her parents, with whom she will be staying while her husband is out of the house.  Musculoskeletal: Strength & Muscle Tone: within normal limits Gait & Station: normal Patient leans: N/A  Psychiatric Specialty Exam  Presentation  General Appearance: Appropriate for Environment; Casual; Fairly Groomed  Eye Contact:Good  Speech:Clear and Coherent; Normal Rate  Speech Volume:Normal  Handedness:Right   Mood and Affect  Mood:Euthymic  Duration of Depression Symptoms: Less than two weeks  Affect:Congruent; Appropriate; Full Range   Thought Process  Thought Processes:Linear  Descriptions of Associations:Intact  Orientation:Full (Time, Place and Person)  Thought Content:Logical  History of Schizophrenia/Schizoaffective disorder:No  Duration of Psychotic Symptoms:No data  recorded Hallucinations:Hallucinations: None  Ideas of Reference:None  Suicidal Thoughts:Suicidal Thoughts: No  Homicidal Thoughts:Homicidal Thoughts: No   Sensorium  Memory:Immediate Good; Recent Good; Remote Good  Judgment:Good  Insight:Good   Executive Functions  Concentration:Good  Attention Span:Good  Recall:Good  Fund of Knowledge:Good  Language:Good   Psychomotor Activity  Psychomotor Activity:Psychomotor Activity: Normal   Assets  Assets:Desire for Improvement; Manufacturing systems engineer; Resilience; Social Support   Sleep  Sleep:Sleep: Good Number of Hours of Sleep: 4.5 (she reports sleeping more than this)   Physical Exam: Physical Exam see discharge summary ROS see discharge summary Blood pressure 102/69, pulse (!) 111, temperature 97.6 F (36.4 C), temperature source Oral, resp. rate 16, height 5\' 6"  (1.676 m), weight 54.4 kg, SpO2 100 %, currently breastfeeding. Body mass index is 19.37 kg/m.  Mental Status Per Nursing Assessment::   On Admission:  NA (Denies SI at this time, is status post suicide attempt by cutting wrists. Verbally contracts for safety.)  Demographic Factors:  Adolescent or young adult and Caucasian  Loss Factors: Loss of significant relationship  Historical Factors: Prior suicide attempts, Family history of suicide, and Impulsivity  Risk Reduction Factors:   Responsible for children under 46 years of age, Sense of responsibility to family, Living with another person, especially a relative, Positive social support, Positive therapeutic relationship, and Positive coping skills or problem solving skills  Continued Clinical Symptoms:  Severe Anxiety and/or Agitation Bipolar Disorder:   Mixed State  Cognitive Features That Contribute To Risk:  None    Suicide Risk:  Mild:  There are no identifiable suicide plans, no associated intent, mild dysphoria and related symptoms, good self-control (both objective and subjective  assessment), few other risk factors, and identifiable protective factors, including available and accessible social support.   Follow-up Information     Services, [facility]. Go on 12/11/2020.   Why:  You have a hospital follow up appointment for therapy and medication management services on 12/11/20 at 10:00 am.  This appointment will be held in person. Contact information: [address] [place] [address] [phone]         [facility]. Call.   Why: Call to schedule an appointment to monitor and assist with medical needs. Contact information: [address] [place] [phone]                Plan Of Care/Follow-up recommendations:  Activity: As tolerated, careful not to injure wounds and bilateral wrists Diet: Heart healthy Other: Please follow with outpatient psychiatric provider.  Please continue psychiatric medications prescribed the day of discharge.  Follow up with primary care/urgent care for removal of staples on wrists.  If suicidal thoughts recur, call therapist, outpatient psychiatrist, [phone], or go to the nearest emergency department.  Cristy Hilts, MD 12/08/2020, 9:59 AM  Total Time Spent in Direct Patient Care:  I personally spent 45 minutes on the unit in direct patient care. The direct patient care time included face-to-face time with the patient, reviewing the patient's chart, communicating with other professionals, and coordinating care. Greater than 50% of this time was spent in counseling or coordinating care with the patient regarding goals of hospitalization, psycho-education, and discharge planning needs.   Phineas Inches, MD Psychiatrist

## 2020-12-08 NOTE — [facility] Group Notes (Signed)
[facility] Group Notes:  (Nursing/MHT/Case Management/Adjunct)  Date:  12/08/2020  Time:  10:35 AM  Type of Therapy:   Orientation/Goals group  Participation Level:  Active  Participation Quality:  Appropriate  Affect:  Appropriate  Cognitive:  Appropriate  Insight:  Appropriate  Engagement in Group:  Engaged  Modes of Intervention:  Orientation  Summary of Progress/Problems: Pt goal for today is to get discharged and do whatever needs to be done for discharge.   Carlester Kasparek J Derrick Orris 12/08/2020, 10:35 AM

## 2020-12-08 NOTE — Progress Notes (Signed)
Pt discharged to lobby. Pt was stable and appreciative at that time. All papers and prescriptions were given and valuables returned. Verbal understanding expressed. Denies SI/HI and A/VH. Pt given opportunity to express concerns and ask questions.  

## 2020-12-08 NOTE — Discharge Summary (Addendum)
Physician Discharge Summary Note  Patient:  Tonya Harmon [facility] is an 30 y.o., female MRN:  [id number] DOB:  August 15, 1990 Patient phone:  [phone] (home)  Patient address:   [address] [place] [address],  Total Time spent with patient: 45 minutes  Date of Admission:  12/01/2020 Date of Discharge: 12/08/2020  Reason for Admission:  Mrs. Dias is a 30 year old female with a past psychiatric history of bipolar disorder multiple suicide attempt, who was admitted to the psychiatric unit for safety and evaluation following suicide attempt by cutting both of her wrist, which required staples.   She became overwhelmed because she found out that her husband had cheated on her again.  She reported thinking she had wasted 14 yrs in her marriage.    Hospital Course:   During hospitalization, patient was evaluated by the psychiatric team.  They received multiple disciplinary care.  After initial intake evaluation, it was decided to adjust medications.  She was kept on her gabapentin and tapered off her Alprazolam. She tolerated this taper well. She was started on Seroquel to help her with sleep and mood stabilization. This was titrated up and she reported success in stabilizing her mood and improving her sleep. Her Restoril was also tapered during her stay to 7.5 mg which she tolerated well. Patient not having side effects to psychiatric medications.   Patient received supportive psychotherapy and was encouraged to participate in group therapy during the hospitalization. During her hospitalization her Right wrist began to swell and become painful.  She was started on a 10 day course of antibiotics and responded well to it.   Patient denied having suicidal thoughts more than 48 hours prior to discharge. She was discharged home with her parents.   On day of discharge patient reports that her mood is much improved from admission.  She reports that she slept well last night.  She  reports she did wake up once but after receiving Vistaril she was able to go back to sleep.  She reports that her appetite is doing good.   She reports no SI, HI, or AVH.  She stated that it was a relief that she was no longer seeing or hearing things.  She attributed this to her medications being properly dosed.  Discussed that we would discharge her with the remainder of her doxycycline to complete the full 10-day course (started Monday 12-04-20).  Instructed her to go to an urgent care Sunday or Monday so that the staples could be removed from both her wrists and her right wrist could be examined to ensure that the antibiotic course was continuing to clear the infection.  She reported understanding.  She reports that she will be going to the beach with her parents for the next few days while her husband moved out of their house.  She reports that she will be back by next week.  Discussed with her that follow-up appointment at [facility] is on Monday and she reported that she would be attending it.  She reports no side effects from medications.  She reports no issues at this time.  She was discharged home with her parents.   Principal Problem: Bipolar affective disorder, depressed, severe, with psychotic behavior (HCC) Discharge Diagnoses: Principal Problem:   Bipolar affective disorder, depressed, severe, with psychotic behavior (HCC)   Past Psychiatric History: Bipolar Disorder, MDD, Anxiety, and multiple suicide attempts  Past Medical History:  Past Medical History:  Diagnosis Date   Anxiety    Back pain  Bipolar 1 disorder (HCC)    Bipolar disorder (HCC)    Common migraine with intractable migraine 06/08/2014   Contraceptive management 10/05/2015   Depression    Epilepsy (HCC)    GERD (gastroesophageal reflux disease)    pregnancy related-rolaids prn   Headache(784.0)    Insomnia    Nexplanon in place 10/04/2015   Pelvic pain in female 10/04/2015   PONV (postoperative nausea and  vomiting)    Sciatica    Seizures [facility])     Past Surgical History:  Procedure Laterality Date   CESAREAN SECTION  2011,2008   CESAREAN SECTION N/A 05/18/2012   Procedure: CESAREAN SECTION;  Surgeon: Tilda Burrow, MD;  Location: [facility];  Service: Obstetrics;  Laterality: N/A;   CESAREAN SECTION WITH BILATERAL TUBAL LIGATION Bilateral 08/02/2016   Procedure: CESAREAN SECTION WITH BILATERAL TUBAL LIGATION;  Surgeon: Tilda Burrow, MD;  Location: [facility] BIRTHING SUIT[facility];  Service: Obstetrics;  Laterality: Bilateral;   INCISION AND DRAINAGE ABSCESS Left 09/10/2013   Procedure: INCISION AND DRAINAGE ABSCESS, left breast/axilla;  Surgeon: Dalia Heading, MD;  Location: [facility];  Service: General;  Laterality: Left;   TUBAL LIGATION     Family History:  Family History  Problem Relation Age of Onset   Diabetes Mother    Fibromyalgia Mother    Rheum arthritis Mother    Diabetes Other    Hypertension Other    Cancer Other    Coronary artery disease Other    Tremor Maternal Grandmother    Cancer Maternal Grandmother        breast, uterine   Alzheimer's disease Maternal Grandmother    Stroke Maternal Grandmother    Coronary artery disease Maternal Grandfather    Diabetes Maternal Grandfather    Hypertension Maternal Grandfather    Diabetes Paternal Grandmother    Hypertension Paternal Grandmother    Cancer Paternal Grandmother        breast   Cancer Paternal Grandfather        pancreatic   Hypertension Paternal Grandfather    Seizures Neg Hx    Family Psychiatric  History:  Mother- Bipolar Disorder Half Sister- Bipolar Disorder Half Brother- Bipolar Disorder Father- Depression Half Brother- Depression and Anxiety Social History:  Social History   Substance and Sexual Activity  Alcohol Use No     Social History   Substance and Sexual Activity  Drug Use No    Social History   Socioeconomic History   Marital status: Married    Spouse name: Not on file   Number of children:  Not on file   Years of education: Not on file   Highest education level: Not on file  Occupational History   Not on file  Tobacco Use   Smoking status: Former    Types: Cigarettes    Passive exposure: Never   Smokeless tobacco: Never  Vaping Use   Vaping Use: Never used  Substance and Sexual Activity   Alcohol use: No   Drug use: No   Sexual activity: Not Currently    Birth control/protection: None  Other Topics Concern   Not on file  Social History Narrative   Patient is right handed.   Patient drinks 2 cups of caffeine daily.   Social Determinants of Health   Financial Resource Strain: Not on file  Food Insecurity: Not on file  Transportation Needs: Not on file  Physical Activity: Not on file  Stress: Not on file  Social Connections: Not on file  Physical Findings:   12/08/20 0949  Facial and Oral Movements  Muscles of Facial Expression 0  Lips and Perioral Area 0  Jaw 0  Tongue 0  Extremity Movements  Upper (arms, wrists, hands, fingers) 0  Lower (legs, knees, ankles, toes) 0  Trunk Movements  Neck, shoulders, hips 0  Overall Severity  Severity of abnormal movements (highest score from questions above) 0  Incapacitation due to abnormal movements 0  Patient's awareness of abnormal movements (rate only patient's report) 0  Dental Status  Current problems with teeth and/or dentures? No  Does patient usually wear dentures? No  AIMS Total Score  AIMS Total Score 0   No cogwheeling present  CIWA:  CIWA-Ar Total: 0 COWS:     Musculoskeletal: Strength & Muscle Tone: within normal limits Gait & Station: normal Patient leans: N/A   Psychiatric Specialty Exam:  Presentation  General Appearance: Appropriate for Environment; Casual; Fairly Groomed  Eye Contact:Good  Speech:Clear and Coherent; Normal Rate  Speech Volume:Normal  Handedness:Right   Mood and Affect  Mood:Euthymic  Affect:Congruent; Appropriate; Full Range   Thought Process   Thought Processes:Linear  Descriptions of Associations:Intact  Orientation:Full (Time, Place and Person)  Thought Content:Logical  History of Schizophrenia/Schizoaffective disorder:No  Duration of Psychotic Symptoms:No data recorded Hallucinations:Hallucinations: None  Ideas of Reference:None  Suicidal Thoughts:Suicidal Thoughts: No  Homicidal Thoughts:Homicidal Thoughts: No   Sensorium  Memory:Immediate Good; Recent Good; Remote Good  Judgment:Good  Insight:Good   Executive Functions  Concentration:Good  Attention Span:Good  Recall:Good  Fund of Knowledge:Good  Language:Good   Psychomotor Activity  Psychomotor Activity:Psychomotor Activity: Normal   Assets  Assets:Desire for Improvement; Manufacturing systems engineer; Resilience; Social Support   Sleep  Sleep:Sleep: Good Number of Hours of Sleep: 4.5 (she reports sleeping more than this)    Physical Exam:  Physical Exam Vitals and nursing note reviewed.  Constitutional:      General: She is not in acute distress.    Appearance: Normal appearance. She is normal weight. She is not ill-appearing or toxic-appearing.  HENT:     Head: Normocephalic and atraumatic.  Pulmonary:     Effort: Pulmonary effort is normal.  Musculoskeletal:        General: Normal range of motion.  Neurological:     General: No focal deficit present.     Mental Status: She is alert.   Review of Systems  Respiratory:  Negative for cough and shortness of breath.   Cardiovascular:  Negative for chest pain.  Gastrointestinal:  Negative for abdominal pain, constipation, diarrhea, nausea and vomiting.  Neurological:  Negative for weakness and headaches.  Psychiatric/Behavioral:  Negative for depression, hallucinations and suicidal ideas. The patient is not nervous/anxious.   Blood pressure 102/69, pulse (!) 111, temperature 97.6 F (36.4 C), temperature source Oral, resp. rate 16, height 5\' 6"  (1.676 m), weight 54.4 kg, SpO2 100 %,  currently breastfeeding. Body mass index is 19.37 kg/m.   Social History   Tobacco Use  Smoking Status Former   Types: Cigarettes   Passive exposure: Never  Smokeless Tobacco Never   Tobacco Cessation:  A prescription for an FDA-approved tobacco cessation medication provided at discharge   Blood Alcohol level:  Lab Results  Component Value Date   ETH <10 12/01/2020   [facility]  02/07/2010    <5        LOWEST DETECTABLE LIMIT FOR SERUM ALCOHOL IS 5 mg/dL FOR MEDICAL PURPOSES ONLY    Metabolic Disorder Labs:  Lab Results  Component Value Date  HGBA1C 4.8 12/02/2020   MPG 91.06 12/02/2020   No results found for: PROLACTIN Lab Results  Component Value Date   CHOL 135 12/02/2020   TRIG 57 12/02/2020   HDL 55 12/02/2020   CHOLHDL 2.5 12/02/2020   VLDL 11 12/02/2020   LDLCALC 69 12/02/2020   LDLCALC  11/04/2009    53        Total Cholesterol/HDL:CHD Risk Coronary Heart Disease Risk Table                     Men   Women  1/2 Average Risk   3.4   3.3  Average Risk       5.0   4.4  2 X Average Risk   9.6   7.1  3 X Average Risk  23.4   11.0        Use the calculated Patient Ratio above and the CHD Risk Table to determine the patient's CHD Risk.        ATP III CLASSIFICATION (LDL):  <100     mg/dL   Optimal  366-440  mg/dL   Near or Above                    Optimal  130-159  mg/dL   Borderline  347-425  mg/dL   High  >956     mg/dL   Very High    See Psychiatric Specialty Exam and Suicide Risk Assessment completed by Attending Physician prior to discharge.  Discharge destination:  Home  Is patient on multiple antipsychotic therapies at discharge:  No   Has Patient had three or more failed trials of antipsychotic monotherapy by history:  No  Recommended Plan for Multiple Antipsychotic Therapies: NA   Prescriptions given at discharge. Patient agreeable to plan. Given opportunity to ask questions. Appears to feel comfortable with discharge denies any current  suicidal or homicidal thought.  Patient is also instructed prior to discharge to: Take all medications as prescribed by mental healthcare provider. Report any adverse effects and or reactions from the medicines to outpatient provider promptly. Patient has been instructed & cautioned: To not engage in alcohol and or illegal drug use while on prescription medicines. In the event of worsening symptoms,  patient is instructed to call the crisis hotline, 911 and or go to the nearest ED for appropriate evaluation and treatment of symptoms. To follow-up with primary care provider for other medical issues, concerns and or health care needs  The patient was evaluated each day by a clinical provider to ascertain response to treatment. Improvement was noted by the patient's report of decreasing symptoms, improved sleep and appetite, affect, medication tolerance, behavior, and participation in unit programming.  Patient was asked each day to complete a self inventory noting mood, mental status, pain, new symptoms, anxiety and concerns.  Patient responded well to medication and being in a therapeutic and supportive environment. Positive and appropriate behavior was noted and the patient was motivated for recovery. The patient worked closely with the treatment team and case manager to develop a discharge plan with appropriate goals. Coping skills, problem solving as well as relaxation therapies were also part of the unit programming.  By the day of discharge patient was in much improved condition than upon admission.  Symptoms were reported as significantly decreased or resolved completely. The patient denied SI/HI and voiced no AVH. The patient was motivated to continue taking medication with a goal of continued improvement in mental health.  Patient was discharged home with a plan to follow up as noted below.   Discharge Instructions     Diet - low sodium heart healthy   Complete by: As directed    Discharge  wound care:   Complete by: As directed    Clean the wound to your wrist well, apply neosporin oint twice daily.  Follow-up with primary care provider for stable removal      Allergies as of 12/08/2020       Reactions   Clonazepam Other (See Comments)   Homicidal thoughts   Tramadol Itching, Other (See Comments)   Really bad itching, seizures Pt reports being able to tolerate morphine, dilaudid, percocet, vicodin with no problems noted in the past.   Flexeril [cyclobenzaprine Hcl] Other (See Comments)   'really bad muscle spasms'   Naproxen Nausea And Vomiting, Other (See Comments)   Bad abdominal pain Pt can take ibuprofen   Prednisone    Hallucinations,  Anger    Trazodone And Nefazodone Other (See Comments)   Night terror   Cefixime Hives   Vistaril [hydroxyzine Hcl] Other (See Comments)   'makes me really edgy'        Medication List     STOP taking these medications    ALPRAZolam 0.5 MG tablet Commonly known as: XANAX   CitraNatal Assure 35-1 & 300 MG tablet   docusate sodium 100 MG capsule Commonly known as: COLACE   doxycycline 100 MG capsule Commonly known as: VIBRAMYCIN Replaced by: doxycycline 100 MG tablet   gabapentin 300 MG capsule Commonly known as: NEURONTIN   HYDROcodone-acetaminophen 5-325 MG tablet Commonly known as: NORCO/VICODIN   ibuprofen 200 MG tablet Commonly known as: ADVIL   ibuprofen 600 MG tablet Commonly known as: ADVIL       TAKE these medications      Indication  acetaminophen 500 MG tablet Commonly known as: TYLENOL Take 500 mg by mouth every 6 (six) hours as needed.  Indication: Fever, Pain   busPIRone 5 MG tablet Commonly known as: BUSPAR Take 1 tablet (5 mg total) by mouth 2 (two) times daily. For anxiety  Indication: Anxiety Disorder   doxycycline 100 MG tablet Commonly known as: VIBRA-TABS Take 1 tablet (100 mg total) by mouth 2 (two) times daily. For infection Replaces: doxycycline 100 MG capsule   Indication: Infection   hydrOXYzine 25 MG tablet Commonly known as: ATARAX/VISTARIL Take 1 tablet (25 mg total) by mouth 3 (three) times daily as needed for anxiety.  Indication: Feeling Anxious   levETIRAcetam 250 MG tablet Commonly known as: KEPPRA Take 250 mg by mouth 2 (two) times daily.  Indication: Seizure   lidocaine 5 % Commonly known as: LIDODERM Place 1 patch onto the skin daily. Remove & Discard patch within 12 hours or as directed by MD: For pain control Start taking on: December 09, 2020  Indication: Nerve Pain After Herpes Zoster or Shingles   neomycin-bacitracin-polymyxin Oint Commonly known as: NEOSPORIN Apply 1 application topically 2 (two) times daily. For wound care  Indication: Wound care   nicotine 21 mg/24hr patch Commonly known as: NICODERM CQ - dosed in mg/24 hours Place 1 patch (21 mg total) onto the skin daily. (May buy from over the counter): For mood control Start taking on: December 09, 2020  Indication: Nicotine Addiction   QUEtiapine 200 MG 24 hr tablet Commonly known as: SEROQUEL XR Take 1 tablet (200 mg total) by mouth at bedtime. For mood control  Indication: Mood control  QUEtiapine 50 MG Tb24 24 hr tablet Commonly known as: SEROQUEL XR Take 1 tablet (50 mg total) by mouth daily. For agitation/mood control Start taking on: December 09, 2020  Indication: Agitation/mood control   temazepam 7.5 MG capsule Commonly known as: RESTORIL Take 1 capsule (7.5 mg total) by mouth at bedtime as needed for sleep. What changed:  medication strength how much to take when to take this reasons to take this  Indication: Trouble Sleeping        Follow-up Information     Services, [facility]. Go on 12/11/2020.   Why: You have a hospital follow up appointment for therapy and medication management services on 12/11/20 at 10:00 am.  This appointment will be held in person. Contact information: [address] [place]  [address] [phone]         [facility]. Call.   Why: Call to schedule an appointment to monitor and assist with medical needs. Contact information: [address] [place] [address] [phone]                Follow-up recommendations:  - Activity as tolerated. - Diet as recommended by PCP. - Keep all scheduled follow-up appointments as recommended.  Comments: Patient is instructed to take all prescribed medications as recommended. Report any side effects or adverse reactions to your outpatient psychiatrist. Patient is instructed to abstain from alcohol and illegal drugs while on prescription medications. In the event of worsening symptoms, patient is instructed to call the crisis hotline, 911, or go to the nearest emergency department for evaluation and treatment.   Signed: Lauro Franklin, MD 12/08/2020, 10:01 AM

## 2020-12-08 NOTE — Group Note (Signed)
Recreation Therapy Group Note   Group Topic:Health and Wellness  Group Date: 12/08/2020 Start Time: 0945 End Time: 1010 Facilitators: Caroll Rancher, LRT/CTRS Location: [address] Dayroom  Goal Area(s) Addresses:  Patient will define components of whole wellness. Patient will verbalize benefit of whole wellness.  Group Description:  Mental Gymnastics.  LRT discussed with patients the importance of giving as much effort into exercising brain muscles as we do our physical body.  Patients were given brain teasers and zig zag word searches.  Patients were given 15 minutes to complete the activity.  LRT and patients then went over the answers and how they felt at completion of activity.   Affect/Mood: Appropriate and Happy   Participation Level: Engaged   Participation Quality: Independent   Behavior: Appropriate   Speech/Thought Process: Focused   Insight: Good   Judgement: Good   Modes of Intervention: Problem-solving and Worksheet   Patient Response to Interventions:  Engaged   Education Outcome:  Acknowledges education and In group clarification offered    Clinical Observations/Individualized Feedback: Pt was bright and appropriate during group session.  Pt was very engaged and focused on completing activity.  Pt showed a lot of enthusiasm doing activity because pt expressed she does these types of puzzles all the time.  Pt was smiling throughout group.     Plan: Continue to engage patient in RT group sessions 2-3x/week.   Caroll Rancher, LRT/CTRS 12/08/2020 11:24 AM

## 2020-12-09 NOTE — [facility] Group Notes (Signed)
ADULT GRIEF GROUP NOTE:   Spiritual care group on grief and loss facilitated by chaplain Dyanne Carrel, [facility]   Group Goal:   Support / Education around grief and loss   Members engage in facilitated group support and psycho-social education.   Group Description:   Following introductions and group rules, group members engaged in facilitated group dialog and support around topic of loss, with particular support around experiences of loss in their lives. Group Identified types of loss (relationships / self / things) and identified patterns, circumstances, and changes that precipitate losses. Reflected on thoughts / feelings around loss, normalized grief responses, and recognized variety in grief experience. Group noted Worden's four tasks of grief in discussion.   Group drew on Adlerian / Rogerian, narrative, MI,   Patient Progress: Tonya Harmon attended group and showed active engagement and participation.  Chaplain Dyanne Carrel, [facility] Pager, [phone] 9:48 PM

## 2021-07-04 ENCOUNTER — Other Ambulatory Visit (HOSPITAL_COMMUNITY): Payer: Self-pay | Admitting: Family Medicine

## 2021-07-04 DIAGNOSIS — M545 Low back pain, unspecified: Secondary | ICD-10-CM

## 2021-08-17 ENCOUNTER — Other Ambulatory Visit (HOSPITAL_COMMUNITY): Payer: Self-pay | Admitting: Family Medicine

## 2021-08-17 DIAGNOSIS — M5430 Sciatica, unspecified side: Secondary | ICD-10-CM

## 2021-12-17 ENCOUNTER — Other Ambulatory Visit (HOSPITAL_COMMUNITY): Payer: Self-pay | Admitting: Family Medicine

## 2021-12-17 ENCOUNTER — Other Ambulatory Visit: Payer: Self-pay | Admitting: Family Medicine

## 2021-12-17 DIAGNOSIS — M5442 Lumbago with sciatica, left side: Secondary | ICD-10-CM
